# Patient Record
Sex: Female | Born: 1960
Health system: Southern US, Community
[De-identification: ages and names within clinical notes are randomized; demographics above are authoritative.]

## PROBLEM LIST (undated history)

## (undated) DIAGNOSIS — M1990 Unspecified osteoarthritis, unspecified site: Secondary | ICD-10-CM

## (undated) DIAGNOSIS — M069 Rheumatoid arthritis, unspecified: Secondary | ICD-10-CM

## (undated) DIAGNOSIS — I10 Essential (primary) hypertension: Secondary | ICD-10-CM

## (undated) HISTORY — PX: TONSILLECTOMY: SUR1361

## (undated) HISTORY — DX: Rheumatoid arthritis, unspecified: M06.9

---

## 1998-04-13 ENCOUNTER — Other Ambulatory Visit: Admission: RE | Admit: 1998-04-13 | Discharge: 1998-04-13 | Payer: Self-pay | Admitting: Obstetrics & Gynecology

## 2000-08-26 ENCOUNTER — Other Ambulatory Visit: Admission: RE | Admit: 2000-08-26 | Discharge: 2000-08-26 | Payer: Self-pay | Admitting: Obstetrics and Gynecology

## 2005-09-17 DIAGNOSIS — I10 Essential (primary) hypertension: Secondary | ICD-10-CM | POA: Insufficient documentation

## 2008-02-03 ENCOUNTER — Encounter: Admission: RE | Admit: 2008-02-03 | Discharge: 2008-02-03 | Payer: Self-pay | Admitting: Rheumatology

## 2008-09-17 DIAGNOSIS — K219 Gastro-esophageal reflux disease without esophagitis: Secondary | ICD-10-CM | POA: Insufficient documentation

## 2010-11-27 ENCOUNTER — Inpatient Hospital Stay (INDEPENDENT_AMBULATORY_CARE_PROVIDER_SITE_OTHER)
Admission: RE | Admit: 2010-11-27 | Discharge: 2010-11-27 | Disposition: A | Discharge status: Home / Self Care | Payer: 59 | Source: Ambulatory Visit | Attending: Family Medicine | Admitting: Family Medicine

## 2010-11-27 DIAGNOSIS — N390 Urinary tract infection, site not specified: Secondary | ICD-10-CM

## 2010-11-27 LAB — POCT URINALYSIS DIPSTICK
Nitrite: POSITIVE — AB
Urobilinogen, UA: 0.2 mg/dL (ref 0.0–1.0)
pH: 6 (ref 5.0–8.0)

## 2010-11-29 LAB — URINE CULTURE

## 2012-06-20 ENCOUNTER — Encounter: Payer: Self-pay | Admitting: Pharmacist

## 2012-06-20 ENCOUNTER — Ambulatory Visit (INDEPENDENT_AMBULATORY_CARE_PROVIDER_SITE_OTHER): Payer: Self-pay | Admitting: Pharmacist

## 2012-06-20 VITALS — BP 116/84 | HR 76 | Ht 67.0 in | Wt 176.2 lb

## 2012-06-20 DIAGNOSIS — K219 Gastro-esophageal reflux disease without esophagitis: Secondary | ICD-10-CM

## 2012-06-20 DIAGNOSIS — I10 Essential (primary) hypertension: Secondary | ICD-10-CM

## 2012-06-20 DIAGNOSIS — J309 Allergic rhinitis, unspecified: Secondary | ICD-10-CM

## 2012-06-20 DIAGNOSIS — M069 Rheumatoid arthritis, unspecified: Secondary | ICD-10-CM

## 2012-06-20 DIAGNOSIS — F419 Anxiety disorder, unspecified: Secondary | ICD-10-CM

## 2012-06-20 DIAGNOSIS — F411 Generalized anxiety disorder: Secondary | ICD-10-CM

## 2012-06-20 NOTE — Assessment & Plan Note (Signed)
Following medication review, no suggestions for change.  Complete medication list provided to patient.  Total time in face to face medication review: 25 minutes.  Patient seen with: Tiney Rouge, PharmD Candidate and Lillia Pauls, PharmD, Pharmacy Resident.

## 2012-06-20 NOTE — Progress Notes (Signed)
  Subjective:    Patient ID: Samantha Yates, female    DOB: 09/20/1960, 51 y.o.   MRN: [id number]  HPI Patient arrives in good spirits for medication review.   Reports seeing Haydee Monica as primary care provider, Dr. Azzie Roup as rheumatologist.  Reports being diagnosed with RA since  2008 and states this is currently under an acceptable level of control with Enbrel.    Review of Systems     Objective:   Physical Exam        Assessment & Plan:  Following medication review, no suggestions for change.  Complete medication list provided to patient.  Total time in face to face medication review: 25 minutes.  Patient seen with: Tiney Rouge, PharmD Candidate and Lillia Pauls, PharmD, Pharmacy Resident.

## 2012-06-20 NOTE — Patient Instructions (Signed)
Thank you for coming in today to complete you medication review.  You will now qualify for the lower copay for Enbrel at [facility].

## 2012-06-23 NOTE — Progress Notes (Signed)
Patient ID: Samantha Yates, female   DOB: 12-13-1960, 51 y.o.   MRN: [id number] Reviewed and agree with Dr. Macky Lower documentation and management.

## 2012-11-13 ENCOUNTER — Ambulatory Visit (INDEPENDENT_AMBULATORY_CARE_PROVIDER_SITE_OTHER): Payer: Self-pay | Admitting: Pharmacist

## 2012-11-13 ENCOUNTER — Encounter: Payer: Self-pay | Admitting: Pharmacist

## 2012-11-13 VITALS — Wt 176.0 lb

## 2012-11-13 DIAGNOSIS — M069 Rheumatoid arthritis, unspecified: Secondary | ICD-10-CM

## 2012-11-13 NOTE — Progress Notes (Signed)
  Subjective:    Patient ID: Samantha Yates, female    DOB: 06-18-1961, 52 y.o.   MRN: [id number]  HPI Patient arrives in good spirits for medication review.  Reports seeing Dwyane Luo, [place]  - [place] as primary care provider, Dr. Azzie Roup / Azucena Fallen as rheumatologist and PA. Reports being diagnosed with RA since 2008 and states this is currently being switched from Enbrel and Arava to Methotrexate and Humira.     Review of Systems     Objective:   Physical Exam        Assessment & Plan:   Following medication review, discussed possible switch to calcium citrate due to PPI use.   Complete medication list provided to patient.  Total time in face to face medication review:  15 minutes.

## 2012-11-13 NOTE — Patient Instructions (Addendum)
Thanks for coming in today.   Consider switching from calcium carbonate to calcium citrate 950mg  (200mg  elemental)   - Suggest you take 2 twice daily (total 800mg  of elemental calcium)

## 2012-11-14 NOTE — Progress Notes (Signed)
Patient ID: Samantha Yates, female   DOB: [birth year], 52 y.o.   MRN: [id number] Reviewed: Agree with Dr. Macky Lower documentation and management.

## 2012-12-25 ENCOUNTER — Other Ambulatory Visit (HOSPITAL_COMMUNITY): Payer: Self-pay | Admitting: Physician Assistant

## 2012-12-25 DIAGNOSIS — Z1231 Encounter for screening mammogram for malignant neoplasm of breast: Secondary | ICD-10-CM

## 2013-01-13 ENCOUNTER — Ambulatory Visit (HOSPITAL_COMMUNITY): Payer: 59

## 2013-01-27 ENCOUNTER — Ambulatory Visit (HOSPITAL_COMMUNITY)
Admission: RE | Admit: 2013-01-27 | Discharge: 2013-01-27 | Disposition: A | Discharge status: Home / Self Care | Payer: 59 | Source: Ambulatory Visit | Attending: Physician Assistant | Admitting: Physician Assistant

## 2013-01-27 DIAGNOSIS — Z1231 Encounter for screening mammogram for malignant neoplasm of breast: Secondary | ICD-10-CM | POA: Insufficient documentation

## 2014-03-10 ENCOUNTER — Ambulatory Visit (INDEPENDENT_AMBULATORY_CARE_PROVIDER_SITE_OTHER): Payer: BC Managed Care – PPO

## 2014-03-10 ENCOUNTER — Encounter: Payer: Self-pay | Admitting: Podiatry

## 2014-03-10 ENCOUNTER — Ambulatory Visit (INDEPENDENT_AMBULATORY_CARE_PROVIDER_SITE_OTHER): Payer: BC Managed Care – PPO | Admitting: Podiatry

## 2014-03-10 VITALS — BP 119/70 | HR 76 | Resp 16 | Ht 68.0 in | Wt 175.0 lb

## 2014-03-10 DIAGNOSIS — M069 Rheumatoid arthritis, unspecified: Secondary | ICD-10-CM

## 2014-03-10 DIAGNOSIS — M0630 Rheumatoid nodule, unspecified site: Secondary | ICD-10-CM

## 2014-03-10 DIAGNOSIS — M6740 Ganglion, unspecified site: Secondary | ICD-10-CM

## 2014-03-10 NOTE — Progress Notes (Signed)
   Subjective:    Patient ID: Samantha Yates, female    DOB: April 22, 1961, 53 y.o.   MRN: [id number]  HPI Comments: i have these places on my big toes. I think its fluid. The left toe has just started and the right one has been there for a while. It hurts to walk. The toes are getting better. i havent done anything for my toes. My doctor put me on prednisone and it seemed to help.  Foot Pain Associated symptoms include fatigue.      Review of Systems  Constitutional: Positive for fatigue.       Sweating  Musculoskeletal:       Joint pain  Allergic/Immunologic: Positive for environmental allergies.  All other systems reviewed and are negative.      Objective:   Physical Exam: I have reviewed her past medical history medications allergies surgeries social history and review of systems. Pulses are strongly palpable bilateral. Neurologic sensorium is intact per since once the monofilament. Deep tendon reflexes are intact bilateral and muscle strength is 5 over 5 dorsiflexors plantar flexors inverters everters all intrinsic musculature is intact. Orthopedic evaluation demonstrates all joints distal to the ankle a full range of motion without crepitation. Cutaneous evaluation demonstrates a large nonpulsatile firm mass to the plantar lateral aspect of the plantar surface of the proximal phalanx right hallux smaller lesion similarly noted the contralateral foot. She also has lesions sub-fifth metatarsal heads bilaterally. Radiographic evaluation does not demonstrate any type of osseous abnormalities in these areas. No calcifications.        Assessment & Plan:  Assessment: Rheumatoid nodules hallux and sub-fifth bilateral right greater than left.  Plan: We used local anesthetic a numb of the toe today and I injected his nodules with Kenalog and local anesthetic. I will followup with her in 6 weeks to see how these are doing we may need to consider surgical intervention.

## 2014-04-21 ENCOUNTER — Ambulatory Visit (INDEPENDENT_AMBULATORY_CARE_PROVIDER_SITE_OTHER): Payer: BC Managed Care – PPO | Admitting: Podiatry

## 2014-04-21 VITALS — BP 132/76 | HR 99 | Resp 16

## 2014-04-21 DIAGNOSIS — M069 Rheumatoid arthritis, unspecified: Secondary | ICD-10-CM

## 2014-04-21 DIAGNOSIS — M0630 Rheumatoid nodule, unspecified site: Secondary | ICD-10-CM

## 2014-04-21 NOTE — Progress Notes (Signed)
She presents today for followup of rheumatoid nodules to the plantar aspect of the IP joint hallux bilaterally. Last time she was in we reinjected that his she states that it gone down considerably and no longer are painful with exception of the right distal little bit. She's also concerned about 2 areas that consistently are thick and painful in the fifth metatarsal heads bilateral.  Objective: Vital signs are stable she is alert and oriented x3 much decrease in size since last visit are the 2 lesions beneath the IP joint of the hallux bilaterally. She also has what appears to be firm nodules beneath the fifth metatarsal heads bilaterally more than likely associated with rheumatoid nodules.  Assessment: Shrinking rheumatoid nodules IP joint hallux bilaterally. However she does rheumatoid nodules to the plantar aspect of the bilateral fifth metatarsals.  Plan: Reinjected the lesion to the right IP joint today and injected the plantar aspect of the fifth metatarsophalangeal joints bilaterally. I will followup with her in about 6 weeks to see how she is doing.

## 2014-06-02 ENCOUNTER — Ambulatory Visit: Payer: BC Managed Care – PPO | Admitting: Podiatry

## 2014-11-11 ENCOUNTER — Other Ambulatory Visit (HOSPITAL_COMMUNITY): Payer: Self-pay | Admitting: Family Medicine

## 2014-11-11 DIAGNOSIS — Z139 Encounter for screening, unspecified: Secondary | ICD-10-CM

## 2014-11-17 ENCOUNTER — Ambulatory Visit (HOSPITAL_COMMUNITY): Payer: 59

## 2014-11-18 ENCOUNTER — Ambulatory Visit (HOSPITAL_COMMUNITY)
Admission: RE | Admit: 2014-11-18 | Discharge: 2014-11-18 | Disposition: A | Discharge status: Home / Self Care | Payer: BLUE CROSS/BLUE SHIELD | Source: Ambulatory Visit | Attending: Family Medicine | Admitting: Family Medicine

## 2014-11-18 DIAGNOSIS — Z1231 Encounter for screening mammogram for malignant neoplasm of breast: Secondary | ICD-10-CM | POA: Insufficient documentation

## 2014-11-18 DIAGNOSIS — Z139 Encounter for screening, unspecified: Secondary | ICD-10-CM

## 2014-11-19 ENCOUNTER — Other Ambulatory Visit: Payer: Self-pay | Admitting: Family Medicine

## 2014-11-19 DIAGNOSIS — R928 Other abnormal and inconclusive findings on diagnostic imaging of breast: Secondary | ICD-10-CM

## 2014-11-24 ENCOUNTER — Ambulatory Visit
Admission: RE | Admit: 2014-11-24 | Discharge: 2014-11-24 | Disposition: A | Discharge status: Home / Self Care | Payer: BLUE CROSS/BLUE SHIELD | Source: Ambulatory Visit | Attending: Family Medicine | Admitting: Family Medicine

## 2014-11-24 DIAGNOSIS — R928 Other abnormal and inconclusive findings on diagnostic imaging of breast: Secondary | ICD-10-CM

## 2016-02-06 ENCOUNTER — Other Ambulatory Visit: Payer: Self-pay

## 2016-02-06 DIAGNOSIS — Z1231 Encounter for screening mammogram for malignant neoplasm of breast: Secondary | ICD-10-CM

## 2016-02-17 ENCOUNTER — Other Ambulatory Visit: Payer: Self-pay | Admitting: Internal Medicine

## 2016-02-17 ENCOUNTER — Ambulatory Visit
Admission: RE | Admit: 2016-02-17 | Discharge: 2016-02-17 | Disposition: A | Discharge status: Home / Self Care | Payer: BLUE CROSS/BLUE SHIELD | Source: Ambulatory Visit

## 2016-02-17 DIAGNOSIS — Z1231 Encounter for screening mammogram for malignant neoplasm of breast: Secondary | ICD-10-CM

## 2016-05-24 DIAGNOSIS — I10 Essential (primary) hypertension: Secondary | ICD-10-CM | POA: Diagnosis not present

## 2016-05-24 DIAGNOSIS — K219 Gastro-esophageal reflux disease without esophagitis: Secondary | ICD-10-CM | POA: Diagnosis not present

## 2016-05-24 DIAGNOSIS — Z1389 Encounter for screening for other disorder: Secondary | ICD-10-CM | POA: Diagnosis not present

## 2016-05-24 DIAGNOSIS — N342 Other urethritis: Secondary | ICD-10-CM | POA: Diagnosis not present

## 2016-06-01 DIAGNOSIS — N342 Other urethritis: Secondary | ICD-10-CM | POA: Diagnosis not present

## 2016-07-17 DIAGNOSIS — Z23 Encounter for immunization: Secondary | ICD-10-CM | POA: Diagnosis not present

## 2016-07-24 ENCOUNTER — Other Ambulatory Visit: Payer: Self-pay | Admitting: Rheumatology

## 2016-07-24 DIAGNOSIS — M0579 Rheumatoid arthritis with rheumatoid factor of multiple sites without organ or systems involvement: Secondary | ICD-10-CM

## 2016-07-24 DIAGNOSIS — Z79631 Long term (current) use of antimetabolite agent: Secondary | ICD-10-CM

## 2016-07-24 DIAGNOSIS — Z79899 Other long term (current) drug therapy: Secondary | ICD-10-CM | POA: Diagnosis not present

## 2016-07-24 LAB — CBC WITH DIFFERENTIAL/PLATELET
BASOS ABS: 0 {cells}/uL (ref 0–200)
BASOS PCT: 0 %
EOS PCT: 3 %
Eosinophils Absolute: 243 cells/uL (ref 15–500)
HCT: 34.9 % — ABNORMAL LOW (ref 35.0–45.0)
HEMOGLOBIN: 11.6 g/dL — AB (ref 11.7–15.5)
LYMPHS ABS: 3159 {cells}/uL (ref 850–3900)
Lymphocytes Relative: 39 %
MCH: 32.2 pg (ref 27.0–33.0)
MCHC: 33.2 g/dL (ref 32.0–36.0)
MCV: 96.9 fL (ref 80.0–100.0)
MPV: 10.7 fL (ref 7.5–12.5)
Monocytes Absolute: 648 cells/uL (ref 200–950)
Monocytes Relative: 8 %
NEUTROS ABS: 4050 {cells}/uL (ref 1500–7800)
Neutrophils Relative %: 50 %
Platelets: 228 10*3/uL (ref 140–400)
RBC: 3.6 MIL/uL — AB (ref 3.80–5.10)
RDW: 14.4 % (ref 11.0–15.0)
WBC: 8.1 10*3/uL (ref 3.8–10.8)

## 2016-07-24 MED ORDER — METHOTREXATE (ANTI-RHEUMATIC) 2.5 MG PO TABS: 20.0000 mg | ORAL_TABLET | ORAL | 0 refills | Status: DC

## 2016-07-24 NOTE — Addendum Note (Signed)
Addended byCandice Camp on: 07/24/2016 10:28 AM   Modules accepted: Orders

## 2016-07-24 NOTE — Telephone Encounter (Signed)
Patient calling to let us know that she will be coming in this afternoon for labwork between 1 and 4 because she is due for her Rx METX.

## 2016-07-24 NOTE — Telephone Encounter (Signed)
Patient needs a refill of MTX [facility]; new insurance card on file.

## 2016-07-24 NOTE — Telephone Encounter (Signed)
Orders placed/ per SD MTX refilled per SD   Patient needs to make a follow up appt please call her to make appt  Last visit 05/01/16 Dec/ Jan

## 2016-07-25 ENCOUNTER — Encounter: Payer: Self-pay | Admitting: Rheumatology

## 2016-07-25 ENCOUNTER — Other Ambulatory Visit: Payer: Self-pay | Admitting: Radiology

## 2016-07-25 LAB — COMPLETE METABOLIC PANEL WITH GFR
ALBUMIN: 4.2 g/dL (ref 3.6–5.1)
ALK PHOS: 63 U/L (ref 33–130)
ALT: 13 U/L (ref 6–29)
AST: 21 U/L (ref 10–35)
BILIRUBIN TOTAL: 0.3 mg/dL (ref 0.2–1.2)
BUN: 14 mg/dL (ref 7–25)
CO2: 21 mmol/L (ref 20–31)
Calcium: 9.3 mg/dL (ref 8.6–10.4)
Chloride: 102 mmol/L (ref 98–110)
Creat: 1.06 mg/dL — ABNORMAL HIGH (ref 0.50–1.05)
GFR, Est African American: 68 mL/min (ref >=60)
GFR, Est Non African American: 59 mL/min — ABNORMAL LOW (ref >=60)
GLUCOSE: 89 mg/dL (ref 65–99)
Potassium: 4.6 mmol/L (ref 3.5–5.3)
SODIUM: 138 mmol/L (ref 135–146)
TOTAL PROTEIN: 6.4 g/dL (ref 6.1–8.1)

## 2016-07-25 MED ORDER — ABATACEPT 125 MG/ML ~~LOC~~ SOSY: PREFILLED_SYRINGE | SUBCUTANEOUS | 0 refills | Status: DC

## 2016-07-25 MED ORDER — METHOTREXATE (ANTI-RHEUMATIC) 2.5 MG PO TABS: 20.0000 mg | ORAL_TABLET | ORAL | 0 refills | Status: DC

## 2016-07-25 NOTE — Telephone Encounter (Signed)
I called patient and advised to decrease her methotrexate to 4 tablets per week. We will check labs as a scheduled. I reviewed her chart and she was clinically doing well. If she has a flare or increased joint pain she supposed to notify us.

## 2016-07-25 NOTE — Telephone Encounter (Signed)
Last visit 05/01/16 Next visit 11/06/16 Labs 07/25/16  Advised patient labs WNL  Ok to refill per Dr Estanislado Pandy resent to correct pharmacy

## 2016-07-26 ENCOUNTER — Other Ambulatory Visit: Payer: Self-pay | Admitting: Radiology

## 2016-07-26 MED ORDER — ABATACEPT 125 MG/ML ~~LOC~~ SOSY: PREFILLED_SYRINGE | SUBCUTANEOUS | 0 refills | Status: DC

## 2016-07-26 NOTE — Telephone Encounter (Signed)
Completed forms for patient for patient assistance program for Orencia/ [employer], faxed, mailed to patient for her records

## 2016-08-30 ENCOUNTER — Telehealth: Payer: Self-pay | Admitting: Pharmacist

## 2016-08-30 DIAGNOSIS — J069 Acute upper respiratory infection, unspecified: Secondary | ICD-10-CM | POA: Diagnosis not present

## 2016-08-30 DIAGNOSIS — Z1389 Encounter for screening for other disorder: Secondary | ICD-10-CM | POA: Diagnosis not present

## 2016-08-30 NOTE — Telephone Encounter (Signed)
I called patient back.  Patient confirms she received her portion of the application.  She wants to go ahead and apply for the patient assistance foundation.    Last visit: 05/01/16 Next visit: 11/06/16 Labs on 07/24/16: CMP with Cr 1.06, GFR 59; CBC with Hbg 11.6 TB Gold on 07/25/16 was negative   Okay to re-submit for Orencia patient assistance program per Dr. Estanislado Pandy.  Provider portion was signed by Dr. Estanislado Pandy and will be faxed.  Advised patient to submit her portion.  She voiced understanding.    Elisabeth Most, Pharm.D., BCPS Clinical Pharmacist Pager: [phone] Phone: [phone] 08/30/2016 1:13 PM

## 2016-08-30 NOTE — Telephone Encounter (Signed)
Patient returned Rachel's call. Please call patient back.

## 2016-08-30 NOTE — Telephone Encounter (Signed)
Received a fax from [place] regarding re-enrollment for [place] Patient Assistance Program.  Letter states patient must resubmit her application for enrollment for 2018.  The letter states that if the patient is enrolled in a Medicare Part D plan, they may not be eligible in 2018 until thye have spent 3% of their annual household income on prescription medications in 2018 (they consider prescription expenses for all members of the household towards the 3%).  Called patient to ensure she receives the renewal form.  Left message for patient to call me back.

## 2016-09-19 ENCOUNTER — Telehealth: Payer: Self-pay | Admitting: Pharmacist

## 2016-09-19 ENCOUNTER — Other Ambulatory Visit: Payer: Self-pay | Admitting: *Deleted

## 2016-09-19 NOTE — Telephone Encounter (Signed)
Received phone call from [facility] requesting Prior Approval for [place].  I reviewed patient's chart.  Patient was planning to re-apply for [place] for the [place] Patient Assistance Program to get her Orencia for no charge.  I called [employer] to check on status of patient's application.  I was informed they needed to know whether prescription should be mailed to patient or provider's office before they can process the application.  I advised it should be mailed to patient.  They report they will process patient's application.  They were unable to tell me how long it would take.    Called patient to discuss.  I updated patient on status of patient assitance program application.  I again discussed with patient that I was not sure if she would qualify for the program since Medicare Part D patient's may not be eligible in 2018 until thye have spent 3% of their annual household income on prescription medications in 2018.  Discussed other options for assistance as well.  Advised patient that the Patient [employer] has funds available and they would assist in paying $3800 toward her [place].  Patient was interested.  We applied for the program and patient was successfully enrolled. I will mail patient the approval information.    I submitted PA request for Orencia.  Will update patient once I get status of PA request.  She confirms she has one more Orencia injection at this time.     Elisabeth Most, Pharm.D., BCPS Clinical Pharmacist Pager: [phone] Phone: [phone] 09/19/2016 3:22 PM

## 2016-09-20 ENCOUNTER — Telehealth: Payer: Self-pay | Admitting: Rheumatology

## 2016-09-20 ENCOUNTER — Telehealth: Payer: Self-pay | Admitting: Pharmacist

## 2016-09-20 ENCOUNTER — Other Ambulatory Visit: Payer: Self-pay | Admitting: *Deleted

## 2016-09-20 MED ORDER — ABATACEPT 125 MG/ML ~~LOC~~ SOSY: PREFILLED_SYRINGE | SUBCUTANEOUS | 2 refills | Status: DC

## 2016-09-20 NOTE — Telephone Encounter (Signed)
Received fax from [facility] requesting additional information on PA for Orencia.  I called [facility] and completed application.  PA was approved [id number], Approval from 09/20/16 to 09/16/17).    I called patient to update her.  Advised her to give [employer] information to [place] when she fills the prescription.  This should help cover her copay.  I provided patient with [place]'s phone number.  She denies any other questions or concerns at this time.    Elisabeth Most, Pharm.D., BCPS Clinical Pharmacist Pager: [phone] Phone: [phone] 09/20/2016 12:01 PM

## 2016-09-20 NOTE — Telephone Encounter (Signed)
The [place] patient assistance program called about Samantha Yates and they are requesting a corrected rx of quantity of 4 for a 30 day supply instead of 12.  Fax# [phone] Cb#[phone]

## 2016-09-20 NOTE — Telephone Encounter (Signed)
Corrected prescription faxed to pharmacy

## 2016-09-24 ENCOUNTER — Telehealth: Payer: Self-pay

## 2016-09-24 NOTE — Telephone Encounter (Signed)
Received an approval fax from [employer] patient assistant foundation for patients Orencia sub-q. She is approved from 09/21/16-09/16/17.   Case number: [id number] Phone number: [phone]  Spoke with patient to update her and she voiced understanding. Told her to contact the company to set up a time to have the medicine mailed to her home.   Will scan documents in epic.  Deavion Strider, [place], CPhT

## 2016-10-23 NOTE — Progress Notes (Deleted)
   Office Visit Note  Patient: Samantha Yates             Date of Birth: 11/07/1960           MRN: [id number]             PCP: Collene Mares, PA-C Referring: Ginger Organ Visit Date: 11/06/2016 Occupation: @GUAROCC @    Subjective:  No chief complaint on file.   History of Present Illness: Samantha Yates is a 56 y.o. female ***   Activities of Daily Living:  Patient reports morning stiffness for *** {minute/hour:19697}.   Patient {ACTIONS;DENIES/REPORTS:21021675::"Denies"} nocturnal pain.  Difficulty dressing/grooming: {ACTIONS;DENIES/REPORTS:21021675::"Denies"} Difficulty climbing stairs: {ACTIONS;DENIES/REPORTS:21021675::"Denies"} Difficulty getting out of chair: {ACTIONS;DENIES/REPORTS:21021675::"Denies"} Difficulty using hands for taps, buttons, cutlery, and/or writing: {ACTIONS;DENIES/REPORTS:21021675::"Denies"}   No Rheumatology ROS completed.   PMFS History:  Patient Active Problem List   Diagnosis Date Noted  . GERD (gastroesophageal reflux disease) 09/17/2008  . Rheumatoid arthritis(714.0) 09/17/2006  . Anxiety 09/17/2006  . Hypertension 09/17/2005  . Allergic rhinitis 09/17/1996    No past medical history on file.  No family history on file. No past surgical history on file. Social History   Social History Narrative  . No narrative on file     Objective: Vital Signs: There were no vitals taken for this visit.   Physical Exam   Musculoskeletal Exam: ***  CDAI Exam: No CDAI exam completed.    Investigation: Findings:  05/01/2016 X-ray of bilateral hands 2 views today showed bilateral PIP narrowing.  There were no MCP or intercarpal joint changes.  There was no interval change from her previous x-rays done in October 2014.  Bilateral feet x-rays 2 views showed PIP and DIP narrowing and erosive changes in her right 5th and left 3rd and 5th MTP joints and bilateral calcaneal spur, but no interval change from her previous x-rays from  October 2014.  Bilateral knee joint x-rays 2 views showed bilateral moderate medial compartment narrowing, no chondrocalcinosis and bilateral moderate patellofemoral narrowing. These findings are consistent with moderate osteoarthritis and moderate chondromalacia patella.   May 2017:  CBC and comprehensive metabolic panel was normal.  Rapid 3 score was 4.3 which is high severity, although she does not have any synovitis on examination.   11/08/2015 negative TB gold   02/03/2008 Her anti-CCP antibody was 1,025 which was very high and hepatitis panel was negative.  Her ANA was negative.  Her previous labs showed rheumatoid factor of 29 and sed rate of 43.      Imaging: No results found.  Speciality Comments: No specialty comments available.    Procedures:  No procedures performed Allergies: Patient has no known allergies.   Assessment / Plan:     Visit Diagnoses: No diagnosis found.    Orders: No orders of the defined types were placed in this encounter.  No orders of the defined types were placed in this encounter.   Face-to-face time spent with patient was *** minutes. 50% of time was spent in counseling and coordination of care.  Follow-Up Instructions: No Follow-up on file.   Lee Kuang, RT  Note - This record has been created using [employer].  Chart creation errors have been sought, but may not always  have been located. Such creation errors do not reflect on  the standard of medical care.

## 2016-11-05 NOTE — Progress Notes (Signed)
Office Visit Note  Patient: Samantha Yates             Date of Birth: 1960-10-25           MRN: [id number]             PCP: Purvis Kilts, MD Referring: Ginger Organ Visit Date: 11/09/2016 Occupation: @GUAROCC @    Subjective:  Pain of the Left Shoulder and Follow-up (has stifness of joints and headache )   History of Present Illness: ROSEANNA JAMES is a 56 y.o. female with history of rheumatoid arthritis and osteoarthritis. She states she's been doing fairly well as regards to rheumatoid arthritis. She's been having some discomfort in her left shoulder. She has occasional nocturnal pain in her left shoulder. She denies any joint swelling. Her feet continue to hurt. She's been tolerating medications well. She has occasional headaches she's not sure if it's related to medications or sinus .  Activities of Daily Living:  Patient reports morning stiffness for 20 minutes.   Patient Denies nocturnal pain.  Difficulty dressing/grooming: Denies Difficulty climbing stairs: Reports Difficulty getting out of chair: Reports Difficulty using hands for taps, buttons, cutlery, and/or writing: Reports   Review of Systems  Constitutional: Positive for fatigue and weakness. Negative for night sweats, weight gain and weight loss.  HENT: Positive for mouth dryness. Negative for mouth sores, trouble swallowing, trouble swallowing and nose dryness.   Eyes: Negative for pain, redness, visual disturbance and dryness.  Respiratory: Negative for cough, shortness of breath and difficulty breathing.   Cardiovascular: Negative for chest pain, palpitations, hypertension, irregular heartbeat and swelling in legs/feet.  Gastrointestinal: Negative for blood in stool, constipation and diarrhea.  Endocrine: Negative for increased urination.  Genitourinary: Negative for vaginal dryness.  Musculoskeletal: Positive for arthralgias, joint pain and morning stiffness. Negative for joint swelling,  myalgias, muscle weakness, muscle tenderness and myalgias.  Skin: Negative for color change, rash, hair loss, skin tightness, ulcers and sensitivity to sunlight.  Allergic/Immunologic: Negative for susceptible to infections.  Neurological: Negative for dizziness, memory loss and night sweats.  Hematological: Negative for swollen glands.  Psychiatric/Behavioral: Negative for depressed mood and sleep disturbance. The patient is not nervous/anxious.     PMFS History:  Patient Active Problem List   Diagnosis Date Noted  . High risk medication use 11/08/2016  . Primary osteoarthritis of both hands 11/08/2016  . Primary osteoarthritis of both feet 11/08/2016  . History of gastroesophageal reflux (GERD) 11/08/2016  . History of hypertension 11/08/2016  . History of anxiety 11/08/2016  . History of allergic rhinitis 11/08/2016  . Former smoker 11/08/2016  . GERD (gastroesophageal reflux disease) 09/17/2008  . Rheumatoid arthritis ([place]) 09/17/2006  . Anxiety 09/17/2006  . Hypertension 09/17/2005  . Allergic rhinitis 09/17/1996    No past medical history on file.  No family history on file. No past surgical history on file. Social History   Social History Narrative  . No narrative on file     Objective: Vital Signs: BP 118/70   Pulse 80   Ht 5\' 7"  (1.702 m)   Wt 186 lb (84.4 kg)   BMI 29.13 kg/m    Physical Exam  Constitutional: She is oriented to person, place, and time. She appears well-developed and well-nourished.  HENT:  Head: Normocephalic and atraumatic.  Eyes: Conjunctivae and EOM are normal.  Neck: Normal range of motion.  Cardiovascular: Normal rate, regular rhythm, normal heart sounds and intact distal pulses.   Pulmonary/Chest: Effort normal  and breath sounds normal.  Abdominal: Soft. Bowel sounds are normal.  Lymphadenopathy:    She has no cervical adenopathy.  Neurological: She is alert and oriented to person, place, and time.  Skin: Skin is warm and dry.  Capillary refill takes less than 2 seconds.  Psychiatric: She has a normal mood and affect. Her behavior is normal.  Nursing note and vitals reviewed.    Musculoskeletal Exam: C-spine and thoracic lumbar spine good range of motion she has good range of motion of her shoulder joints elbow joints wrist joint MCPs PIPs DIPs she has painful range of motion of her left shoulder joint was some discomfort. Hip joints knee joints ankles MTPs PIPs with good range of motion she is some discomfort across her MTPs without synovitis.  CDAI Exam: CDAI Homunculus Exam:   Tenderness:  LUE: glenohumeral Right foot: 1st MTP Left foot: 1st MTP  Joint Counts:  CDAI Tender Joint count: 1 CDAI Swollen Joint count: 0  Global Assessments:  Patient Global Assessment: 3 Provider Global Assessment: 3  CDAI Calculated Score: 7    Investigation: Findings:  05/01/2016 X-ray of bilateral hands 2 views today showed bilateral PIP narrowing.  There were no MCP or intercarpal joint changes.  There was no interval change from her previous x-rays done in October 2014.  Bilateral feet x-rays 2 views showed PIP and DIP narrowing and erosive changes in her right 5th and left 3rd and 5th MTP joints and bilateral calcaneal spur, but no interval change from her previous x-rays from October 2014.  Bilateral knee joint x-rays 2 views showed bilateral moderate medial compartment narrowing, no chondrocalcinosis and bilateral moderate patellofemoral narrowing. These findings are consistent with moderate osteoarthritis and moderate chondromalacia patella.   May 2017:  CBC and comprehensive metabolic panel was normal.  Rapid 3 score was 4.3 which is high severity, although she does not have any synovitis on examination.   11/08/2015 negative TB gold   02/03/2008 Her anti-CCP antibody was 1,025 which was very high and hepatitis panel was negative.  Her ANA was negative.  Her previous labs showed rheumatoid factor of 29 and sed rate of  43.      Imaging: No results found.  Speciality Comments: No specialty comments available.    Procedures:  Large Joint Inj Date/Time: 11/09/2016 2:29 PM Performed by: Bo Merino Authorized by: Bo Merino   Consent Given by:  Patient Site marked: the procedure site was marked   Timeout: prior to procedure the correct patient, procedure, and site was verified   Indications:  Pain Location:  Shoulder Site:  L glenohumeral Prep: patient was prepped and draped in usual sterile fashion   Needle Size:  27 G Needle Length:  1.5 inches Approach:  Posterior Ultrasound Guidance: No   Fluoroscopic Guidance: No   Arthrogram: No   Medications:  1 mL lidocaine 1 %; 40 mg triamcinolone acetonide 40 MG/ML Aspiration Attempted: Yes   Aspirate amount (mL):  0 Patient tolerance:  Patient tolerated the procedure well with no immediate complications   Allergies: Patient has no known allergies.   Assessment / Plan:     Visit Diagnoses: Rheumatoid arthritis involving multiple sites with positive rheumatoid factor (HCC) - Positive RF, positive CCP, erosive disease with nodulosis. She had no synovitis on examination she seems to be doing well on current dose of methotrexate and Orencia combination.  High risk medication use - Orencia subcutaneous, methotrexate 8 tablets per week, folic acid 2 tablets a day - Plan: Quantiferon  tb gold assay, CBC with Differential/Platelet, COMPLETE METABOLIC PANEL WITH GFR today and every 3 months  Primary osteoarthritis of both hands: Joint protection and muscle strengthening discussed.  Primary osteoarthritis of both feet: Proper fitting shoes were discussed.  Left shoulder joint bursitis and discomfort after informed consent was obtained the left shoulder joint was prepped and stroke fashion injected as described above she tolerated the procedure well.  Her other medical problems are as follows  History of gastroesophageal reflux  (GERD)  History of hypertension  History of anxiety  History of allergic rhinitis  Former smoker  Methotrexate, long term, current use - Plan: CBC with Differential/Platelet, COMPLETE METABOLIC PANEL WITH GFR  History of immunosuppression - Plan: Quantiferon tb gold assay    Orders: Orders Placed This Encounter  Procedures  . Large Joint Injection/Arthrocentesis  . Quantiferon tb gold assay  . CBC with Differential/Platelet  . COMPLETE METABOLIC PANEL WITH GFR   No orders of the defined types were placed in this encounter.   Face-to-face time spent with patient was 30 minutes. 50% of time was spent in counseling and coordination of care.  Follow-Up Instructions: Return in about 5 months (around 04/08/2017) for Rheumatoid arthritis.   Bo Merino, MD  Note - This record has been created using [employer].  Chart creation errors have been sought, but may not always  have been located. Such creation errors do not reflect on  the standard of medical care.

## 2016-11-06 ENCOUNTER — Ambulatory Visit: Payer: Medicare Other | Admitting: Rheumatology

## 2016-11-08 DIAGNOSIS — M19071 Primary osteoarthritis, right ankle and foot: Secondary | ICD-10-CM | POA: Insufficient documentation

## 2016-11-08 DIAGNOSIS — Z8709 Personal history of other diseases of the respiratory system: Secondary | ICD-10-CM | POA: Insufficient documentation

## 2016-11-08 DIAGNOSIS — Z8719 Personal history of other diseases of the digestive system: Secondary | ICD-10-CM | POA: Insufficient documentation

## 2016-11-08 DIAGNOSIS — M19041 Primary osteoarthritis, right hand: Secondary | ICD-10-CM | POA: Insufficient documentation

## 2016-11-08 DIAGNOSIS — M19072 Primary osteoarthritis, left ankle and foot: Secondary | ICD-10-CM

## 2016-11-08 DIAGNOSIS — Z79899 Other long term (current) drug therapy: Secondary | ICD-10-CM | POA: Insufficient documentation

## 2016-11-08 DIAGNOSIS — M19042 Primary osteoarthritis, left hand: Secondary | ICD-10-CM

## 2016-11-08 DIAGNOSIS — Z87891 Personal history of nicotine dependence: Secondary | ICD-10-CM | POA: Insufficient documentation

## 2016-11-08 DIAGNOSIS — Z8659 Personal history of other mental and behavioral disorders: Secondary | ICD-10-CM | POA: Insufficient documentation

## 2016-11-08 DIAGNOSIS — Z8679 Personal history of other diseases of the circulatory system: Secondary | ICD-10-CM | POA: Insufficient documentation

## 2016-11-09 ENCOUNTER — Encounter: Payer: Self-pay | Admitting: Rheumatology

## 2016-11-09 ENCOUNTER — Ambulatory Visit (INDEPENDENT_AMBULATORY_CARE_PROVIDER_SITE_OTHER): Payer: Medicare Other | Admitting: Rheumatology

## 2016-11-09 VITALS — BP 118/70 | HR 80 | Ht 67.0 in | Wt 186.0 lb

## 2016-11-09 DIAGNOSIS — Z79899 Other long term (current) drug therapy: Secondary | ICD-10-CM | POA: Diagnosis not present

## 2016-11-09 DIAGNOSIS — Z8679 Personal history of other diseases of the circulatory system: Secondary | ICD-10-CM

## 2016-11-09 DIAGNOSIS — M19041 Primary osteoarthritis, right hand: Secondary | ICD-10-CM | POA: Diagnosis not present

## 2016-11-09 DIAGNOSIS — Z8659 Personal history of other mental and behavioral disorders: Secondary | ICD-10-CM

## 2016-11-09 DIAGNOSIS — Z8719 Personal history of other diseases of the digestive system: Secondary | ICD-10-CM

## 2016-11-09 DIAGNOSIS — Z8709 Personal history of other diseases of the respiratory system: Secondary | ICD-10-CM

## 2016-11-09 DIAGNOSIS — M25512 Pain in left shoulder: Secondary | ICD-10-CM | POA: Diagnosis not present

## 2016-11-09 DIAGNOSIS — Z87891 Personal history of nicotine dependence: Secondary | ICD-10-CM

## 2016-11-09 DIAGNOSIS — M19042 Primary osteoarthritis, left hand: Secondary | ICD-10-CM

## 2016-11-09 DIAGNOSIS — M0579 Rheumatoid arthritis with rheumatoid factor of multiple sites without organ or systems involvement: Secondary | ICD-10-CM

## 2016-11-09 DIAGNOSIS — Z9225 Personal history of immunosupression therapy: Secondary | ICD-10-CM | POA: Diagnosis not present

## 2016-11-09 DIAGNOSIS — M19072 Primary osteoarthritis, left ankle and foot: Secondary | ICD-10-CM

## 2016-11-09 DIAGNOSIS — M19071 Primary osteoarthritis, right ankle and foot: Secondary | ICD-10-CM

## 2016-11-09 DIAGNOSIS — Z79631 Long term (current) use of antimetabolite agent: Secondary | ICD-10-CM

## 2016-11-09 LAB — CBC WITH DIFFERENTIAL/PLATELET
BASOS ABS: 0 {cells}/uL (ref 0–200)
Basophils Relative: 0 %
EOS ABS: 138 {cells}/uL (ref 15–500)
EOS PCT: 2 %
HEMATOCRIT: 37.9 % (ref 35.0–45.0)
HEMOGLOBIN: 12.8 g/dL (ref 11.7–15.5)
LYMPHS ABS: 2967 {cells}/uL (ref 850–3900)
Lymphocytes Relative: 43 %
MCH: 32.1 pg (ref 27.0–33.0)
MCHC: 33.8 g/dL (ref 32.0–36.0)
MCV: 95 fL (ref 80.0–100.0)
MONO ABS: 552 {cells}/uL (ref 200–950)
MPV: 10.6 fL (ref 7.5–12.5)
Monocytes Relative: 8 %
NEUTROS PCT: 47 %
Neutro Abs: 3243 cells/uL (ref 1500–7800)
Platelets: 231 10*3/uL (ref 140–400)
RBC: 3.99 MIL/uL (ref 3.80–5.10)
RDW: 13.9 % (ref 11.0–15.0)
WBC: 6.9 10*3/uL (ref 3.8–10.8)

## 2016-11-09 LAB — COMPLETE METABOLIC PANEL WITH GFR
ALBUMIN: 4.1 g/dL (ref 3.6–5.1)
ALK PHOS: 69 U/L (ref 33–130)
ALT: 16 U/L (ref 6–29)
AST: 19 U/L (ref 10–35)
BILIRUBIN TOTAL: 0.5 mg/dL (ref 0.2–1.2)
BUN: 17 mg/dL (ref 7–25)
CALCIUM: 9.5 mg/dL (ref 8.6–10.4)
CO2: 24 mmol/L (ref 20–31)
Chloride: 103 mmol/L (ref 98–110)
Creat: 1.08 mg/dL — ABNORMAL HIGH (ref 0.50–1.05)
GFR, EST AFRICAN AMERICAN: 66 mL/min (ref >=60)
GFR, EST NON AFRICAN AMERICAN: 57 mL/min — AB (ref >=60)
Glucose, Bld: 89 mg/dL (ref 65–99)
POTASSIUM: 5 mmol/L (ref 3.5–5.3)
Sodium: 136 mmol/L (ref 135–146)
TOTAL PROTEIN: 6.6 g/dL (ref 6.1–8.1)

## 2016-11-09 MED ORDER — LIDOCAINE HCL 1 % IJ SOLN
1.0000 mL | INTRAMUSCULAR | Status: AC | PRN
  Administered 2016-11-09: 1 mL

## 2016-11-09 MED ORDER — TRIAMCINOLONE ACETONIDE 40 MG/ML IJ SUSP
40.0000 mg | INTRAMUSCULAR | Status: AC | PRN
  Administered 2016-11-09: 40 mg via INTRA_ARTICULAR

## 2016-11-09 NOTE — Progress Notes (Signed)
Rheumatology Medication Review by a Pharmacist Does the patient feel that his/her medications are working for him/her?  Yes Has the patient been experiencing any side effects to the medications prescribed?  No Does the patient have any problems obtaining medications?  No - patient gets her Orencia from the patient assistance program.  She is enrolled through 09/16/17.    Issues to address at subsequent visits: None   Pharmacist comments:  Samantha Yates is a pleasant 56 yo F who presents for follow up of her rheumatoid arthritis.  She is currently taking Orencia SQ 125 mg weekly, methotrexate 10 mg (4 tablets) weekly, and FaBB one tablet daily.  She is due for standing labs and TB Gold today.  Patient denies any questions or concerns regarding her medications at this time.    Elisabeth Most, Pharm.D., BCPS, CPP Clinical Pharmacist Pager: [phone] Phone: [phone] 11/09/2016 2:12 PM

## 2016-11-09 NOTE — Patient Instructions (Signed)
Standing Labs We placed an order today for your standing lab work.    Please come back and get your standing labs in May 2018 and every 3 months.   We have open lab Monday through Friday from 8:30-11:30 AM and 1:30-4 PM at the office of Dr. Shaili Deveshwar/Naitik Panwala, PA.   The office is located at [address], [address], [place], [place] [address] No appointment is necessary.   Labs are drawn by [facility].  You may receive a bill from [facility] for your lab work.     

## 2016-11-11 NOTE — Progress Notes (Signed)
Labs are stable - we will continue to monitor.

## 2016-11-12 ENCOUNTER — Telehealth: Payer: Self-pay | Admitting: Rheumatology

## 2016-11-12 ENCOUNTER — Telehealth: Payer: Self-pay | Admitting: Radiology

## 2016-11-12 NOTE — Telephone Encounter (Signed)
-----   Message from Bo Merino, MD sent at 11/11/2016 10:14 PM EST ----- Labs are stable we will continue to monitor

## 2016-11-12 NOTE — Telephone Encounter (Signed)
Patient advised of lab results and verbalized understanding.  

## 2016-11-12 NOTE — Telephone Encounter (Signed)
Patient returned your call.  [id number].  Thank you.

## 2016-11-12 NOTE — Telephone Encounter (Signed)
I have called patient to advise labs are stable  

## 2016-11-12 NOTE — Telephone Encounter (Signed)
Patient has questions about kidney function levels for lab work. Patient is also requesting that the labs be sent to Dr. Loraine Leriche office please (her PCP).

## 2016-11-12 NOTE — Telephone Encounter (Signed)
Patient advised of lab results

## 2016-11-13 LAB — QUANTIFERON TB GOLD ASSAY (BLOOD)
Interferon Gamma Release Assay: NEGATIVE
Quantiferon Nil Value: 0.05 IU/mL
Quantiferon Tb Ag Minus Nil Value: <0 IU/mL

## 2016-11-16 ENCOUNTER — Other Ambulatory Visit: Payer: Self-pay | Admitting: *Deleted

## 2016-11-16 MED ORDER — ABATACEPT 125 MG/ML ~~LOC~~ SOSY: PREFILLED_SYRINGE | SUBCUTANEOUS | 2 refills | Status: DC

## 2016-11-16 NOTE — Telephone Encounter (Signed)
Last Visit: 11/09/16 Next Visit: 04/09/17 Labs: 11/09/16 Stable  Okay to refill Orencia?

## 2016-11-16 NOTE — Telephone Encounter (Signed)
ok 

## 2016-11-22 ENCOUNTER — Other Ambulatory Visit: Payer: Self-pay | Admitting: Rheumatology

## 2016-11-22 DIAGNOSIS — E782 Mixed hyperlipidemia: Secondary | ICD-10-CM | POA: Diagnosis not present

## 2016-11-22 DIAGNOSIS — R109 Unspecified abdominal pain: Secondary | ICD-10-CM | POA: Diagnosis not present

## 2016-11-22 DIAGNOSIS — R39198 Other difficulties with micturition: Secondary | ICD-10-CM | POA: Diagnosis not present

## 2016-11-22 NOTE — Telephone Encounter (Signed)
Last Visit: 11/09/16 Next Visit: 04/09/17 Labs: 11/09/16 Stable  Okay to refill FABB?

## 2016-11-22 NOTE — Telephone Encounter (Signed)
ok 

## 2016-11-25 ENCOUNTER — Other Ambulatory Visit: Payer: Self-pay | Admitting: Rheumatology

## 2016-11-25 DIAGNOSIS — M0579 Rheumatoid arthritis with rheumatoid factor of multiple sites without organ or systems involvement: Secondary | ICD-10-CM

## 2016-11-26 NOTE — Telephone Encounter (Signed)
11/09/16 last visit Next visit 04/09/2017 Labs stable 11/09/16 Ok to refill per Dr Estanislado Pandy

## 2016-12-15 ENCOUNTER — Encounter (HOSPITAL_COMMUNITY): Payer: Self-pay | Admitting: Emergency Medicine

## 2016-12-15 DIAGNOSIS — I10 Essential (primary) hypertension: Secondary | ICD-10-CM | POA: Insufficient documentation

## 2016-12-15 DIAGNOSIS — Z79899 Other long term (current) drug therapy: Secondary | ICD-10-CM | POA: Insufficient documentation

## 2016-12-15 DIAGNOSIS — W07XXXA Fall from chair, initial encounter: Secondary | ICD-10-CM | POA: Diagnosis not present

## 2016-12-15 DIAGNOSIS — Y92009 Unspecified place in unspecified non-institutional (private) residence as the place of occurrence of the external cause: Secondary | ICD-10-CM | POA: Diagnosis not present

## 2016-12-15 DIAGNOSIS — S6991XA Unspecified injury of right wrist, hand and finger(s), initial encounter: Secondary | ICD-10-CM | POA: Diagnosis present

## 2016-12-15 DIAGNOSIS — S7011XA Contusion of right thigh, initial encounter: Secondary | ICD-10-CM | POA: Insufficient documentation

## 2016-12-15 DIAGNOSIS — S40021A Contusion of right upper arm, initial encounter: Secondary | ICD-10-CM | POA: Insufficient documentation

## 2016-12-15 DIAGNOSIS — S40022A Contusion of left upper arm, initial encounter: Secondary | ICD-10-CM | POA: Insufficient documentation

## 2016-12-15 DIAGNOSIS — S61210A Laceration without foreign body of right index finger without damage to nail, initial encounter: Secondary | ICD-10-CM | POA: Insufficient documentation

## 2016-12-15 DIAGNOSIS — S60221A Contusion of right hand, initial encounter: Secondary | ICD-10-CM | POA: Diagnosis not present

## 2016-12-15 DIAGNOSIS — Y999 Unspecified external cause status: Secondary | ICD-10-CM | POA: Insufficient documentation

## 2016-12-15 DIAGNOSIS — Y9389 Activity, other specified: Secondary | ICD-10-CM | POA: Insufficient documentation

## 2016-12-15 DIAGNOSIS — Z87891 Personal history of nicotine dependence: Secondary | ICD-10-CM | POA: Diagnosis not present

## 2016-12-15 NOTE — ED Triage Notes (Signed)
Standing on chair to get meds and fell with a bowl in her hand now with multiple pain sites and bruises including L elbow bruising to her L arm and L thigh as well as an active bleed to her

## 2016-12-16 ENCOUNTER — Emergency Department (HOSPITAL_COMMUNITY)
Admission: EM | Admit: 2016-12-16 | Discharge: 2016-12-16 | Disposition: A | Discharge status: Home / Self Care | Payer: Medicare Other | Attending: Emergency Medicine | Admitting: Emergency Medicine

## 2016-12-16 DIAGNOSIS — S61210A Laceration without foreign body of right index finger without damage to nail, initial encounter: Secondary | ICD-10-CM

## 2016-12-16 DIAGNOSIS — W19XXXA Unspecified fall, initial encounter: Secondary | ICD-10-CM

## 2016-12-16 DIAGNOSIS — T07XXXA Unspecified multiple injuries, initial encounter: Secondary | ICD-10-CM

## 2016-12-16 HISTORY — DX: Unspecified osteoarthritis, unspecified site: M19.90

## 2016-12-16 HISTORY — DX: Essential (primary) hypertension: I10

## 2016-12-16 MED ORDER — LIDOCAINE HCL (PF) 1 % IJ SOLN
5.0000 mL | Freq: Once | INTRAMUSCULAR | Status: AC
  Administered 2016-12-16: 5 mL
  Filled 2016-12-16: qty 5

## 2016-12-16 MED ORDER — BACITRACIN ZINC 500 UNIT/GM EX OINT
TOPICAL_OINTMENT | CUTANEOUS | Status: AC
  Administered 2016-12-16: 1
  Filled 2016-12-16: qty 0.9

## 2016-12-16 NOTE — ED Provider Notes (Signed)
[facility] DEPT Provider Note   CSN: [id number] Arrival date & time: 12/15/16  2257     History   Chief Complaint Chief Complaint  Patient presents with  . Fall    multiple bruisees  . Laceration    HPI Samantha Yates is a 56 y.o. female.  The history is provided by the patient.  Fall  This is a new problem. Episode onset: just prior to arrival. The problem occurs constantly. The problem has not changed since onset.Pertinent negatives include no chest pain, no abdominal pain and no headaches. Nothing aggravates the symptoms. Nothing relieves the symptoms. She has tried rest for the symptoms. The treatment provided mild relief.  Laceration   Incident onset: just prior to arrival. The laceration is located on the right hand. The laceration is 1 cm in size. The pain is mild. The pain has been constant since onset. She reports no foreign bodies present. Her tetanus status is unknown.  patient presents after mechanical fall at home She fell with bowl in her hand She sustained two lacerations to right index finger She reports bruising to upper extremities and right thigh  No head injury No LOC No HA/neck pain No cp/abd pain She is ambulatory She is not on anticoagulants  Past Medical History:  Diagnosis Date  . Arthritis   . Hypertension     Patient Active Problem List   Diagnosis Date Noted  . High risk medication use 11/08/2016  . Primary osteoarthritis of both hands 11/08/2016  . Primary osteoarthritis of both feet 11/08/2016  . History of gastroesophageal reflux (GERD) 11/08/2016  . History of hypertension 11/08/2016  . History of anxiety 11/08/2016  . History of allergic rhinitis 11/08/2016  . Former smoker 11/08/2016  . GERD (gastroesophageal reflux disease) 09/17/2008  . Rheumatoid arthritis ([place]) 09/17/2006  . Anxiety 09/17/2006  . Hypertension 09/17/2005  . Allergic rhinitis 09/17/1996    Past Surgical History:  Procedure Laterality Date  .  TONSILLECTOMY      OB History    No data available       Home Medications    Prior to Admission medications   Medication Sig Start Date End Date Taking? Authorizing Provider  Abatacept [name]) 125 MG/ML SOSY One device sub q weekly 11/16/16   Bo Merino, MD  citalopram (CELEXA) 40 MG tablet Take 1 tablet (40 mg total) by mouth at bedtime. 06/20/12   Zenia Resides, MD  enalapril (VASOTEC) 20 MG tablet Take 1 tablet (20 mg total) by mouth at bedtime. 06/20/12   Zenia Resides, MD  FABB 2.2-25-1 MG TABS TAKE ONE TABLET BY MOUTH ONCE DAILY. 11/22/16   Bo Merino, MD  Flaxseed, Linseed, 1000 MG CAPS Take 1 capsule by mouth at bedtime.    Historical Provider, MD  flintstones complete (FLINTSTONES) 60 MG chewable tablet Chew 1 tablet by mouth daily with breakfast.    Historical Provider, MD  folic acid (FOLVITE) 1 MG tablet Take 2 tablets (2 mg total) by mouth at bedtime. 06/20/12   Zenia Resides, MD  loratadine (CLARITIN) 10 MG tablet Take 10 mg by mouth daily.    Historical Provider, MD  methotrexate (RHEUMATREX) 2.5 MG tablet TAKE 8 TABLETS BY MOUTH  ONCE A WEEK AT BEDTIME 11/26/16   Bo Merino, MD  Naproxen Sodium 220 MG CAPS Take 1 capsule (220 mg total) by mouth 2 (two) times daily in the am and at bedtime.. 06/20/12   Zenia Resides, MD  pantoprazole (PROTONIX) 40 MG  tablet Take 40 mg by mouth daily.    Historical Provider, MD    Family History No family history on file.  Social History Social History  Substance Use Topics  . Smoking status: Former Smoker    Packs/day: 0.30    Types: Cigarettes    Start date: 09/17/1986    Quit date: 09/17/1998  . Smokeless tobacco: Never Used  . Alcohol use No     Allergies   Patient has no known allergies.   Review of Systems Review of Systems  Constitutional: Negative for fever.  Cardiovascular: Negative for chest pain.  Gastrointestinal: Negative for abdominal pain.  Musculoskeletal: Negative for back pain.    Skin: Positive for wound.  Neurological: Negative for weakness and headaches.  All other systems reviewed and are negative.    Physical Exam Updated Vital Signs BP 140/71 (BP Location: Left Arm)   Pulse 96   Temp 98.7 F (37.1 C) (Oral)   Resp 18   Ht 5' 10.5" (1.791 m)   Wt 72.6 kg   SpO2 100%   BMI 22.63 kg/m   Physical Exam CONSTITUTIONAL: Well developed/well nourished HEAD: Normocephalic/atraumatic EYES: EOMI/PERRL ENMT: Mucous membranes moist NECK: supple no meningeal signs SPINE/BACK:entire spine nontender CV: S1/S2 noted, no murmurs/rubs/gallops noted LUNGS: Lungs are clear to auscultation bilaterally, no apparent distress ABDOMEN: soft, nontender, no rebound or guarding, bowel sounds noted throughout abdomen GU:no cva tenderness NEURO: Pt is awake/alert/appropriate, moves all extremitiesx4.  No facial droop.   EXTREMITIES: pulses normal/equal, full ROM.  Scattered bruising to both upper extremities but no focal tenderness/deformities, full ROM of both UE without difficulty Right inner thigh - bruising noted, does not include perineum, female chaperone present Pt ambulatory.  Pelvis stable.   SKIN: warm, color normal, 2 lacerations to proximal/extensor surface of right index finger, no tendon/bone/foreign bodies noted PSYCH: no abnormalities of mood noted, alert and oriented to situation   ED Treatments / Results  Labs (all labs ordered are listed, but only abnormal results are displayed) Labs Reviewed - No data to display  EKG  EKG Interpretation None       Radiology No results found.  Procedures Procedures  LACERATION REPAIR Performed by: Sharyon Cable Consent: Verbal consent obtained. Risks and benefits: risks, benefits and alternatives were discussed Patient identity confirmed: provided demographic data Time out performed prior to procedure Prepped and Draped in normal sterile fashion Wound explored Laceration Location: right index  finger Laceration Length: 1cm No Foreign Bodies seen or palpated Anesthesia: local infiltration Local anesthetic: lidocaine  Anesthetic total: 3 ml Irrigation method: tap water Amount of cleaning: standard Skin closure: simple interrupted Number of sutures or staples: 3 prolene Technique: interrupted Patient tolerance: Patient tolerated the procedure well with no immediate complications.  LACERATION REPAIR Performed by: Sharyon Cable Consent: Verbal consent obtained. Risks and benefits: risks, benefits and alternatives were discussed Patient identity confirmed: provided demographic data Time out performed prior to procedure Prepped and Draped in normal sterile fashion Wound explored Laceration Location: right index finger, extensor surface Laceration Length: 0.5cm No Foreign Bodies seen or palpated Anesthesia: local infiltration Local anesthetic: lidocaine  Anesthetic total: 1 ml Irrigation method: tap water Amount of cleaning: standard Skin closure: dermabond/tissue adhesive Technique: tissue adhesive Patient tolerance: Patient tolerated the procedure well with no immediate complications.   Medications Ordered in ED Medications  lidocaine (PF) (XYLOCAINE) 1 % injection 5 mL (5 mLs Other Given 12/16/16 0138)  bacitracin 500 UNIT/GM ointment (1 application  Given 10/26/39 0306)  Initial Impression / Assessment and Plan / ED Course  I have reviewed the triage vital signs and the nursing notes.      No imaging indicated Pt tolerated wound repair Discussed return precautions including signs of wound infection Final Clinical Impressions(s) / ED Diagnoses   Final diagnoses:  Fall, initial encounter  Laceration of right index finger without foreign body without damage to nail, initial encounter  Contusion of multiple sites    New Prescriptions Discharge Medication List as of 12/16/2016  2:36 AM       Ripley Fraise, MD 12/16/16 [phone]

## 2016-12-25 ENCOUNTER — Other Ambulatory Visit: Payer: Self-pay | Admitting: Rheumatology

## 2016-12-25 NOTE — Telephone Encounter (Signed)
Last Visit: 11/09/16  Next visit 04/09/2017  Okay to refill FAAB?

## 2016-12-25 NOTE — Telephone Encounter (Signed)
ok 

## 2016-12-26 ENCOUNTER — Other Ambulatory Visit: Payer: Self-pay | Admitting: Rheumatology

## 2016-12-26 MED ORDER — ABATACEPT 125 MG/ML ~~LOC~~ SOSY: PREFILLED_SYRINGE | SUBCUTANEOUS | 2 refills | Status: DC

## 2016-12-26 NOTE — Telephone Encounter (Signed)
Patient called requesting a refill of her Orencia and have it sent to [facility].  Their refill number is [phone].  Patient's [phone]

## 2016-12-26 NOTE — Telephone Encounter (Signed)
11/09/16 last visit Next visit 04/09/2017 Labs stable 11/09/16 TB Gold: 11/09/16 Neg  Okay to refill Orencia?

## 2016-12-26 NOTE — Telephone Encounter (Signed)
ok 

## 2016-12-27 ENCOUNTER — Other Ambulatory Visit: Payer: Self-pay | Admitting: *Deleted

## 2016-12-27 NOTE — Telephone Encounter (Signed)
Patient uses [place] Patient Assistance.  Prescription faxed.

## 2016-12-28 DIAGNOSIS — Z0001 Encounter for general adult medical examination with abnormal findings: Secondary | ICD-10-CM | POA: Diagnosis not present

## 2016-12-28 DIAGNOSIS — M069 Rheumatoid arthritis, unspecified: Secondary | ICD-10-CM | POA: Diagnosis not present

## 2017-01-07 DIAGNOSIS — L089 Local infection of the skin and subcutaneous tissue, unspecified: Secondary | ICD-10-CM | POA: Diagnosis not present

## 2017-01-10 ENCOUNTER — Ambulatory Visit: Payer: Medicare Other | Admitting: General Surgery

## 2017-01-10 DIAGNOSIS — L0291 Cutaneous abscess, unspecified: Secondary | ICD-10-CM | POA: Diagnosis not present

## 2017-01-17 ENCOUNTER — Telehealth: Payer: Self-pay | Admitting: Rheumatology

## 2017-01-17 DIAGNOSIS — L0291 Cutaneous abscess, unspecified: Secondary | ICD-10-CM | POA: Diagnosis not present

## 2017-01-17 NOTE — Telephone Encounter (Signed)
Patient advised that she may resume her MTX and Orencia once she has finished the antibiotic and the bite has healed. Patient state she she has seen the doctor today and states the bite is healing but is not completely healed yet. Patient advised to hold her medication until it has completely healed and and if it is not healing to see her PCP. Patient verbalized understanding.

## 2017-01-17 NOTE — Telephone Encounter (Signed)
Patient has not had last two doses of MTX and last dose of Orencia. Patient was bitten by a spider on her back and it was infected. She was put on doxycycline and last dose of that is this upcoming Monday. She was told to hold off on taking the medications until she finished the antibiotics. She is due to take MTX on Tuesday and Orencia on Saturday (so she will miss a second dose of Orencia). Please call patient and let her know if it will be ok to resume MTX on Tuesday.

## 2017-01-24 DIAGNOSIS — L03312 Cellulitis of back [any part except buttock]: Secondary | ICD-10-CM | POA: Diagnosis not present

## 2017-01-24 DIAGNOSIS — Z1389 Encounter for screening for other disorder: Secondary | ICD-10-CM | POA: Diagnosis not present

## 2017-02-15 DIAGNOSIS — D849 Immunodeficiency, unspecified: Secondary | ICD-10-CM | POA: Diagnosis not present

## 2017-02-15 DIAGNOSIS — S70261A Insect bite (nonvenomous), right hip, initial encounter: Secondary | ICD-10-CM | POA: Diagnosis not present

## 2017-02-15 DIAGNOSIS — J069 Acute upper respiratory infection, unspecified: Secondary | ICD-10-CM | POA: Diagnosis not present

## 2017-02-21 DIAGNOSIS — Z1211 Encounter for screening for malignant neoplasm of colon: Secondary | ICD-10-CM | POA: Diagnosis not present

## 2017-03-25 ENCOUNTER — Other Ambulatory Visit: Payer: Self-pay | Admitting: Rheumatology

## 2017-03-26 NOTE — Telephone Encounter (Signed)
11/09/16 last visit Next visit 04/09/2017  Okay to refill per Dr. Estanislado Pandy

## 2017-04-02 ENCOUNTER — Telehealth: Payer: Self-pay

## 2017-04-02 NOTE — Telephone Encounter (Signed)
Patient pharmacy called concerning Rx refill for pre-filled syringes for Orencia.  CB# is [phone].  Fax# is [phone].  Please advise.

## 2017-04-03 NOTE — Telephone Encounter (Signed)
30d

## 2017-04-03 NOTE — Telephone Encounter (Signed)
Contacted pharmacy and authorized a refill for a 30 day supply.

## 2017-04-03 NOTE — Telephone Encounter (Signed)
Last Visit: 11/25/16 Next Visit: 04/09/17 Labs: 11/09/16 stable  TB Gold: 11/09/16 Neg  Patient coming to update labs tomorrow  Okay to refill Orencia?

## 2017-04-04 ENCOUNTER — Other Ambulatory Visit: Payer: Self-pay

## 2017-04-04 ENCOUNTER — Telehealth: Payer: Self-pay | Admitting: Radiology

## 2017-04-04 DIAGNOSIS — Z79899 Other long term (current) drug therapy: Secondary | ICD-10-CM | POA: Diagnosis not present

## 2017-04-04 LAB — CBC WITH DIFFERENTIAL/PLATELET
BASOS ABS: 86 {cells}/uL (ref 0–200)
Basophils Relative: 1 %
Eosinophils Absolute: 172 cells/uL (ref 15–500)
Eosinophils Relative: 2 %
HEMATOCRIT: 38.1 % (ref 35.0–45.0)
HEMOGLOBIN: 12.7 g/dL (ref 11.7–15.5)
LYMPHS PCT: 43 %
Lymphs Abs: 3698 cells/uL (ref 850–3900)
MCH: 32 pg (ref 27.0–33.0)
MCHC: 33.3 g/dL (ref 32.0–36.0)
MCV: 96 fL (ref 80.0–100.0)
MONO ABS: 774 {cells}/uL (ref 200–950)
MPV: 10.9 fL (ref 7.5–12.5)
Monocytes Relative: 9 %
NEUTROS PCT: 45 %
Neutro Abs: 3870 cells/uL (ref 1500–7800)
Platelets: 245 10*3/uL (ref 140–400)
RBC: 3.97 MIL/uL (ref 3.80–5.10)
RDW: 13.6 % (ref 11.0–15.0)
WBC: 8.6 10*3/uL (ref 3.8–10.8)

## 2017-04-04 NOTE — Telephone Encounter (Signed)
Patient came in for labs today, would like Rx changed from PFS to pen, can you correct this on the Rx

## 2017-04-04 NOTE — Progress Notes (Signed)
Office Visit Note  Patient: Samantha Yates             Date of Birth: 01/21/1961           MRN: [id number]             PCP: Sharilyn Sites, MD Referring: Sharilyn Sites, MD Visit Date: 04/09/2017 Occupation: @GUAROCC @    Subjective:  Rheumatoid Arthritis (Follow up, doing good)   History of Present Illness: Samantha Yates is a 56 y.o. female with history of sero positive rheumatoid arthritis. She had a spider bite in April. She states she had to take 3 rounds of antibiotics. She was off methotrexate and Orencia for about 3 weeks. She states she had a mild flare. She restarted methotrexate and then added Orencia. She has some discomfort in her hands and her knee joints continue to be stiff. No joint swelling.  Activities of Daily Living:  Patient reports morning stiffness for 1 hour.   Patient Reports nocturnal pain.  Difficulty dressing/grooming: Denies Difficulty climbing stairs: Reports Difficulty getting out of chair: Denies Difficulty using hands for taps, buttons, cutlery, and/or writing: Denies   Review of Systems  Constitutional: Positive for fatigue. Negative for night sweats, weight gain, weight loss and weakness.  HENT: Positive for mouth dryness. Negative for mouth sores, trouble swallowing, trouble swallowing and nose dryness.   Eyes: Negative for pain, redness, visual disturbance and dryness.  Respiratory: Negative for cough, shortness of breath and difficulty breathing.   Cardiovascular: Negative for chest pain, palpitations, hypertension, irregular heartbeat and swelling in legs/feet.  Gastrointestinal: Negative for blood in stool, constipation and diarrhea.  Endocrine: Negative for increased urination.  Genitourinary: Negative for vaginal dryness.  Musculoskeletal: Positive for arthralgias, joint pain and morning stiffness. Negative for joint swelling, myalgias, muscle weakness, muscle tenderness and myalgias.  Skin: Negative for color change, rash, hair loss,  skin tightness, ulcers and sensitivity to sunlight.  Allergic/Immunologic: Negative for susceptible to infections.  Neurological: Negative for dizziness, memory loss and night sweats.  Hematological: Negative for swollen glands.  Psychiatric/Behavioral: Negative for depressed mood and sleep disturbance. The patient is not nervous/anxious.     PMFS History:  Patient Active Problem List   Diagnosis Date Noted  . High risk medication use 11/08/2016  . Primary osteoarthritis of both hands 11/08/2016  . Primary osteoarthritis of both feet 11/08/2016  . History of gastroesophageal reflux (GERD) 11/08/2016  . History of hypertension 11/08/2016  . History of anxiety 11/08/2016  . History of allergic rhinitis 11/08/2016  . Former smoker 11/08/2016  . GERD (gastroesophageal reflux disease) 09/17/2008  . Rheumatoid arthritis ([place]) 09/17/2006  . Anxiety 09/17/2006  . Hypertension 09/17/2005  . Allergic rhinitis 09/17/1996    Past Medical History:  Diagnosis Date  . Arthritis   . Hypertension   . Rheumatoid arthritis ([place])     History reviewed. No pertinent family history. Past Surgical History:  Procedure Laterality Date  . TONSILLECTOMY     Social History   Social History Narrative  . No narrative on file     Objective: Vital Signs: BP 93/60 (BP Location: Left Arm, Patient Position: Sitting, Cuff Size: Normal)   Pulse 79   Resp 16   Ht 5' 7.5" (1.715 m)   Wt 175 lb (79.4 kg)   BMI 27.00 kg/m    Physical Exam  Constitutional: She is oriented to person, place, and time. She appears well-developed and well-nourished.  HENT:  Head: Normocephalic and atraumatic.  Eyes: Conjunctivae and  EOM are normal.  Neck: Normal range of motion.  Cardiovascular: Normal rate, regular rhythm, normal heart sounds and intact distal pulses.   Pulmonary/Chest: Effort normal and breath sounds normal.  Abdominal: Soft. Bowel sounds are normal.  Lymphadenopathy:    She has no cervical  adenopathy.  Neurological: She is alert and oriented to person, place, and time.  Skin: Skin is warm and dry. Capillary refill takes less than 2 seconds.  Psychiatric: She has a normal mood and affect. Her behavior is normal.  Nursing note and vitals reviewed.    Musculoskeletal Exam: C-spine and thoracic lumbar spine good range of motion. Shoulder joints although joints was joint MCPs PIPs DIPs with good range of motion. Hip joints knee joints ankles MTPs PIPs are good range of motion with no synovitis. She has some discomfort and tenderness over  left subacromial bursa.  CDAI Exam: CDAI Homunculus Exam:   Joint Counts:  CDAI Tender Joint count: 0 CDAI Swollen Joint count: 0  Global Assessments:  Patient Global Assessment: 2 Provider Global Assessment: 2  CDAI Calculated Score: 4    Investigation: Findings:  11/09/2016 negative TB gold   CBC Latest Ref Rng & Units 04/04/2017 11/09/2016 07/24/2016  WBC 3.8 - 10.8 K/uL 8.6 6.9 8.1  Hemoglobin 11.7 - 15.5 g/dL 12.7 12.8 11.6(L)  Hematocrit 35.0 - 45.0 % 38.1 37.9 34.9(L)  Platelets 140 - 400 K/uL 245 231 228   CMP Latest Ref Rng & Units 04/04/2017 11/09/2016 07/24/2016  Glucose 65 - 99 mg/dL 97 89 89  BUN 7 - 25 mg/dL 15 17 14   Creatinine 0.50 - 1.05 mg/dL 0.92 1.08(H) 1.06(H)  Sodium 135 - 146 mmol/L 138 136 138  Potassium 3.5 - 5.3 mmol/L 4.6 5.0 4.6  Chloride 98 - 110 mmol/L 102 103 102  CO2 20 - 31 mmol/L 20 24 21   Calcium 8.6 - 10.4 mg/dL 9.4 9.5 9.3  Total Protein 6.1 - 8.1 g/dL 6.7 6.6 6.4  Total Bilirubin 0.2 - 1.2 mg/dL 0.3 0.5 0.3  Alkaline Phos 33 - 130 U/L 57 69 63  AST 10 - 35 U/L 19 19 21   ALT 6 - 29 U/L 15 16 13     Imaging: No results found.  Speciality Comments: No specialty comments available.    Procedures:  No procedures performed Allergies: Patient has no known allergies.   Assessment / Plan:     Visit Diagnoses: Rheumatoid arthritis involving multiple sites with positive rheumatoid factor  (HCC) - +RF,+anti-CCP with nodulosis. She's doing much better on current combination of therapy. She wants to switch to Orencia pain instead of syringes. We will change her next prescription.  High risk medication use - Methotrexate 8 ytabs po q week, Folic acid 2mg  po qd and Orencia sub q week. Her labs are within normal limits. We will continue to monitor labs every 3 months.  Subacromial bursitis of left shoulder joint: I have given her exercises for shoulder joints. We will see response to that if she has ongoing this discomfort he can consider cortisone injection in future.  Primary osteoarthritis of both hands: Joint protection and muscle strengthening discussed.  Primary osteoarthritis of both feet: She has proper fitting shoes currently.  History of hypertension: Blood pressure is well controlled.  History of anxiety  History of gastroesophageal reflux (GERD)  Former smoker  History of allergic rhinitis    Orders: No orders of the defined types were placed in this encounter.  No orders of the defined types were placed in  this encounter.   Face-to-face time spent with patient was 30 minutes. 50% of time was spent in counseling and coordination of care.  Follow-Up Instructions: Return in about 5 months (around 09/09/2017) for Rheumatoid arthritis.   Bo Merino, MD  Note - This record has been created using Editor, commissioning.  Chart creation errors have been sought, but may not always  have been located. Such creation errors do not reflect on  the standard of medical care.

## 2017-04-05 LAB — COMPLETE METABOLIC PANEL WITH GFR
ALBUMIN: 4 g/dL (ref 3.6–5.1)
ALK PHOS: 57 U/L (ref 33–130)
ALT: 15 U/L (ref 6–29)
AST: 19 U/L (ref 10–35)
BUN: 15 mg/dL (ref 7–25)
CALCIUM: 9.4 mg/dL (ref 8.6–10.4)
CHLORIDE: 102 mmol/L (ref 98–110)
CO2: 20 mmol/L (ref 20–31)
Creat: 0.92 mg/dL (ref 0.50–1.05)
GFR, EST AFRICAN AMERICAN: 80 mL/min (ref >=60)
GFR, EST NON AFRICAN AMERICAN: 69 mL/min (ref >=60)
Glucose, Bld: 97 mg/dL (ref 65–99)
POTASSIUM: 4.6 mmol/L (ref 3.5–5.3)
Sodium: 138 mmol/L (ref 135–146)
Total Bilirubin: 0.3 mg/dL (ref 0.2–1.2)
Total Protein: 6.7 g/dL (ref 6.1–8.1)

## 2017-04-05 NOTE — Telephone Encounter (Signed)
Attempted to contact patient and left message for patient to call the office.  

## 2017-04-05 NOTE — Progress Notes (Signed)
wnl

## 2017-04-08 NOTE — Telephone Encounter (Signed)
Prescription called to pharmacy on 04/02/17 for 30 day supply. Patient states she is okay to wait until next refill to switch to the Orencia pens.

## 2017-04-09 ENCOUNTER — Encounter: Payer: Self-pay | Admitting: Rheumatology

## 2017-04-09 ENCOUNTER — Ambulatory Visit (INDEPENDENT_AMBULATORY_CARE_PROVIDER_SITE_OTHER): Payer: Medicare Other | Admitting: Rheumatology

## 2017-04-09 VITALS — BP 93/60 | HR 79 | Resp 16 | Ht 67.5 in | Wt 175.0 lb

## 2017-04-09 DIAGNOSIS — Z8709 Personal history of other diseases of the respiratory system: Secondary | ICD-10-CM

## 2017-04-09 DIAGNOSIS — Z87891 Personal history of nicotine dependence: Secondary | ICD-10-CM

## 2017-04-09 DIAGNOSIS — M19041 Primary osteoarthritis, right hand: Secondary | ICD-10-CM | POA: Diagnosis not present

## 2017-04-09 DIAGNOSIS — M19071 Primary osteoarthritis, right ankle and foot: Secondary | ICD-10-CM | POA: Diagnosis not present

## 2017-04-09 DIAGNOSIS — M7552 Bursitis of left shoulder: Secondary | ICD-10-CM | POA: Diagnosis not present

## 2017-04-09 DIAGNOSIS — Z8659 Personal history of other mental and behavioral disorders: Secondary | ICD-10-CM | POA: Diagnosis not present

## 2017-04-09 DIAGNOSIS — Z8719 Personal history of other diseases of the digestive system: Secondary | ICD-10-CM | POA: Diagnosis not present

## 2017-04-09 DIAGNOSIS — M0579 Rheumatoid arthritis with rheumatoid factor of multiple sites without organ or systems involvement: Secondary | ICD-10-CM

## 2017-04-09 DIAGNOSIS — Z79899 Other long term (current) drug therapy: Secondary | ICD-10-CM | POA: Diagnosis not present

## 2017-04-09 DIAGNOSIS — Z8679 Personal history of other diseases of the circulatory system: Secondary | ICD-10-CM

## 2017-04-09 DIAGNOSIS — M19072 Primary osteoarthritis, left ankle and foot: Secondary | ICD-10-CM

## 2017-04-09 DIAGNOSIS — M19042 Primary osteoarthritis, left hand: Secondary | ICD-10-CM | POA: Diagnosis not present

## 2017-04-09 MED ORDER — ABATACEPT 125 MG/ML ~~LOC~~ SOAJ: 125.0000 mg | SUBCUTANEOUS | 0 refills | Status: DC

## 2017-04-09 NOTE — Progress Notes (Signed)
Patient today asked about switching from [place] to [place] [facility].  I educated patient on how to use the [employer].  I spoke to Richardson Landry, pharmacist, and changed patient's prescription that was called in on 04/02/17 from syringes to [employer].    Elisabeth Most, Pharm.D., BCPS, CPP Clinical Pharmacist Pager: [phone] Phone: [phone] 04/09/2017 10:58 AM

## 2017-04-09 NOTE — Patient Instructions (Addendum)
Standing Labs We placed an order today for your standing lab work.    Please come back and get your standing labs in October and every 3 months. Shoulder Exercises Ask your health care provider which exercises are safe for you. Do exercises exactly as told by your health care provider and adjust them as directed. It is normal to feel mild stretching, pulling, tightness, or discomfort as you do these exercises, but you should stop right away if you feel sudden pain or your pain gets worse.Do not begin these exercises until told by your health care provider. RANGE OF MOTION EXERCISES These exercises warm up your muscles and joints and improve the movement and flexibility of your shoulder. These exercises also help to relieve pain, numbness, and tingling. These exercises involve stretching your injured shoulder directly. Exercise A: Pendulum  1. Stand near a wall or a surface that you can hold onto for balance. 2. Bend at the waist and let your left / right arm hang straight down. Use your other arm to support you. Keep your back straight and do not lock your knees. 3. Relax your left / right arm and shoulder muscles, and move your hips and your trunk so your left / right arm swings freely. Your arm should swing because of the motion of your body, not because you are using your arm or shoulder muscles. 4. Keep moving your body so your arm swings in the following directions, as told by your health care provider: ? Side to side. ? Forward and backward. ? In clockwise and counterclockwise circles. 5. Continue each motion for __________ seconds, or for as long as told by your health care provider. 6. Slowly return to the starting position. Repeat __________ times. Complete this exercise __________ times a day. Exercise B:Flexion, Standing  1. Stand and hold a broomstick, a cane, or a similar object. Place your hands a little more than shoulder-width apart on the object. Your left / right hand should  be palm-up, and your other hand should be palm-down. 2. Keep your elbow straight and keep your shoulder muscles relaxed. Push the stick down with your healthy arm to raise your left / right arm in front of your body, and then over your head until you feel a stretch in your shoulder. ? Avoid shrugging your shoulder while you raise your arm. Keep your shoulder blade tucked down toward the middle of your back. 3. Hold for __________ seconds. 4. Slowly return to the starting position. Repeat __________ times. Complete this exercise __________ times a day. Exercise C: Abduction, Standing 1. Stand and hold a broomstick, a cane, or a similar object. Place your hands a little more than shoulder-width apart on the object. Your left / right hand should be palm-up, and your other hand should be palm-down. 2. While keeping your elbow straight and your shoulder muscles relaxed, push the stick across your body toward your left / right side. Raise your left / right arm to the side of your body and then over your head until you feel a stretch in your shoulder. ? Do not raise your arm above shoulder height, unless your health care provider tells you to do that. ? Avoid shrugging your shoulder while you raise your arm. Keep your shoulder blade tucked down toward the middle of your back. 3. Hold for __________ seconds. 4. Slowly return to the starting position. Repeat __________ times. Complete this exercise __________ times a day. Exercise D:Internal Rotation  1. Place your left /  right hand behind your back, palm-up. 2. Use your other hand to dangle an exercise band, a towel, or a similar object over your shoulder. Grasp the band with your left / right hand so you are holding onto both ends. 3. Gently pull up on the band until you feel a stretch in the front of your left / right shoulder. ? Avoid shrugging your shoulder while you raise your arm. Keep your shoulder blade tucked down toward the middle of your  back. 4. Hold for __________ seconds. 5. Release the stretch by letting go of the band and lowering your hands. Repeat __________ times. Complete this exercise __________ times a day. STRETCHING EXERCISES These exercises warm up your muscles and joints and improve the movement and flexibility of your shoulder. These exercises also help to relieve pain, numbness, and tingling. These exercises are done using your healthy shoulder to help stretch the muscles of your injured shoulder. Exercise E: Warehouse manager (External Rotation and Abduction)  1. Stand in a doorway with one of your feet slightly in front of the other. This is called a staggered stance. If you cannot reach your forearms to the door frame, stand facing a corner of a room. 2. Choose one of the following positions as told by your health care provider: ? Place your hands and forearms on the door frame above your head. ? Place your hands and forearms on the door frame at the height of your head. ? Place your hands on the door frame at the height of your elbows. 3. Slowly move your weight onto your front foot until you feel a stretch across your chest and in the front of your shoulders. Keep your head and chest upright and keep your abdominal muscles tight. 4. Hold for __________ seconds. 5. To release the stretch, shift your weight to your back foot. Repeat __________ times. Complete this stretch __________ times a day. Exercise F:Extension, Standing 1. Stand and hold a broomstick, a cane, or a similar object behind your back. ? Your hands should be a little wider than shoulder-width apart. ? Your palms should face away from your back. 2. Keeping your elbows straight and keeping your shoulder muscles relaxed, move the stick away from your body until you feel a stretch in your shoulder. ? Avoid shrugging your shoulders while you move the stick. Keep your shoulder blade tucked down toward the middle of your back. 3. Hold for __________  seconds. 4. Slowly return to the starting position. Repeat __________ times. Complete this exercise __________ times a day. STRENGTHENING EXERCISES These exercises build strength and endurance in your shoulder. Endurance is the ability to use your muscles for a long time, even after they get tired. Exercise G:External Rotation  1. Sit in a stable chair without armrests. 2. Secure an exercise band at elbow height on your left / right side. 3. Place a soft object, such as a folded towel or a small pillow, between your left / right upper arm and your body to move your elbow a few inches away (about 10 cm) from your side. 4. Hold the end of the band so it is tight and there is no slack. 5. Keeping your elbow pressed against the soft object, move your left / right forearm out, away from your abdomen. Keep your body steady so only your forearm moves. 6. Hold for __________ seconds. 7. Slowly return to the starting position. Repeat __________ times. Complete this exercise __________ times a day. Exercise H:Shoulder Abduction  1. Sit in a stable chair without armrests, or stand. 2. Hold a __________ weight in your left / right hand, or hold an exercise band with both hands. 3. Start with your arms straight down and your left / right palm facing in, toward your body. 4. Slowly lift your left / right hand out to your side. Do not lift your hand above shoulder height unless your health care provider tells you that this is safe. ? Keep your arms straight. ? Avoid shrugging your shoulder while you do this movement. Keep your shoulder blade tucked down toward the middle of your back. 5. Hold for __________ seconds. 6. Slowly lower your arm, and return to the starting position. Repeat __________ times. Complete this exercise __________ times a day. Exercise I:Shoulder Extension 1. Sit in a stable chair without armrests, or stand. 2. Secure an exercise band to a stable object in front of you where it  is at shoulder height. 3. Hold one end of the exercise band in each hand. Your palms should face each other. 4. Straighten your elbows and lift your hands up to shoulder height. 5. Step back, away from the secured end of the exercise band, until the band is tight and there is no slack. 6. Squeeze your shoulder blades together as you pull your hands down to the sides of your thighs. Stop when your hands are straight down by your sides. Do not let your hands go behind your body. 7. Hold for __________ seconds. 8. Slowly return to the starting position. Repeat __________ times. Complete this exercise __________ times a day. Exercise J:Standing Shoulder Row 1. Sit in a stable chair without armrests, or stand. 2. Secure an exercise band to a stable object in front of you so it is at waist height. 3. Hold one end of the exercise band in each hand. Your palms should be in a thumbs-up position. 4. Bend each of your elbows to an "L" shape (about 90 degrees) and keep your upper arms at your sides. 5. Step back until the band is tight and there is no slack. 6. Slowly pull your elbows back behind you. 7. Hold for __________ seconds. 8. Slowly return to the starting position. Repeat __________ times. Complete this exercise __________ times a day. Exercise K:Shoulder Press-Ups  1. Sit in a stable chair that has armrests. Sit upright, with your feet flat on the floor. 2. Put your hands on the armrests so your elbows are bent and your fingers are pointing forward. Your hands should be about even with the sides of your body. 3. Push down on the armrests and use your arms to lift yourself off of the chair. Straighten your elbows and lift yourself up as much as you comfortably can. ? Move your shoulder blades down, and avoid letting your shoulders move up toward your ears. ? Keep your feet on the ground. As you get stronger, your feet should support less of your body weight as you lift yourself up. 4. Hold  for __________ seconds. 5. Slowly lower yourself back into the chair. Repeat __________ times. Complete this exercise __________ times a day. Exercise L: Wall Push-Ups  1. Stand so you are facing a stable wall. Your feet should be about one arm-length away from the wall. 2. Lean forward and place your palms on the wall at shoulder height. 3. Keep your feet flat on the floor as you bend your elbows and lean forward toward the wall. 4. Hold for __________ seconds. 5.  Straighten your elbows to push yourself back to the starting position. Repeat __________ times. Complete this exercise __________ times a day. This information is not intended to replace advice given to you by your health care provider. Make sure you discuss any questions you have with your health care provider. Document Released: 07/18/2005 Document Revised: 05/28/2016 Document Reviewed: 05/15/2015 Elsevier Interactive Patient Education  [employer].   We have open lab Monday through Friday from 8:30-11:30 AM and 1:30-4 PM at the office of Dr. Bo Merino.   The office is located at [address], [place], [place], [place] [address] No appointment is necessary.   Labs are drawn by [facility].  You may receive a bill from [facility] for your lab work. If you have any questions regarding directions or hours of operation,  please call [phone].

## 2017-04-09 NOTE — Addendum Note (Signed)
Addended by: Cyndia Skeeters on: 04/09/2017 10:58 AM   Modules accepted: Orders

## 2017-04-25 ENCOUNTER — Telehealth: Payer: Self-pay | Admitting: Rheumatology

## 2017-04-25 MED ORDER — ABATACEPT 125 MG/ML ~~LOC~~ SOAJ: 125.0000 mg | SUBCUTANEOUS | 2 refills | Status: DC

## 2017-04-25 NOTE — Telephone Encounter (Signed)
I called patient to discuss.  She reports she was able to get Orencia filled but it was still for the syringes.  She is requesting we send over prescription for the Orencia ClickJets.    Last visit: 04/09/17 Next visit: 09/18/17 Labs: 04/04/17 CBC and CMP WNL; 11/09/16 TB Gold negative  Ok to refill per Dr. Estanislado Pandy.  Orencia Patient Assistance Prescription form was completed and faxed.    Elisabeth Most, Pharm.D., BCPS, CPP Clinical Pharmacist Pager: [phone] Phone: [phone] 04/25/2017 4:15 PM

## 2017-04-25 NOTE — Telephone Encounter (Signed)
Patient calling due to [place]. Patient states she was to  switch to click pen, but pharmacy did not have Rx. Please call to advise.

## 2017-05-24 DIAGNOSIS — H5201 Hypermetropia, right eye: Secondary | ICD-10-CM | POA: Diagnosis not present

## 2017-05-24 DIAGNOSIS — H52221 Regular astigmatism, right eye: Secondary | ICD-10-CM | POA: Diagnosis not present

## 2017-05-24 DIAGNOSIS — H5212 Myopia, left eye: Secondary | ICD-10-CM | POA: Diagnosis not present

## 2017-05-28 ENCOUNTER — Telehealth: Payer: Self-pay | Admitting: Rheumatology

## 2017-05-28 NOTE — Telephone Encounter (Signed)
Last Visit: 04/09/17 Next Visit: 09/18/17 Labs: 04/04/17 WNL TB Gold: 11/09/16 Neg   Okay to give sample of Orencia?

## 2017-05-28 NOTE — Telephone Encounter (Signed)
Patient has called to get Orencia from [facility], but worried she will not be able to meds with bad weather coming. Patient would like to know if she can get a sample. Please call to advise.

## 2017-05-28 NOTE — Telephone Encounter (Signed)
ok 

## 2017-05-28 NOTE — Telephone Encounter (Signed)
Patient advised she may come to the office and pick up sample.

## 2017-05-29 ENCOUNTER — Telehealth (INDEPENDENT_AMBULATORY_CARE_PROVIDER_SITE_OTHER): Payer: Self-pay | Admitting: Rheumatology

## 2017-05-29 NOTE — Telephone Encounter (Signed)
Disability form from [facility] ("unable to complete" per Dr. Arlean Hopping office) faxed [phone]

## 2017-06-25 ENCOUNTER — Other Ambulatory Visit: Payer: Self-pay | Admitting: Rheumatology

## 2017-06-26 NOTE — Telephone Encounter (Signed)
Last Visit: 04/09/17 Next Visit: 09/18/17  Okay to refill per Dr. Estanislado Pandy

## 2017-07-05 ENCOUNTER — Other Ambulatory Visit: Payer: Self-pay | Admitting: *Deleted

## 2017-07-05 DIAGNOSIS — Z23 Encounter for immunization: Secondary | ICD-10-CM | POA: Diagnosis not present

## 2017-07-05 NOTE — Telephone Encounter (Signed)
Received refill request from patient assistance [employer].  Last Visit: 04/09/17 Next Visit: 09/18/17 Labs: 04/04/17 WNL TB Gold: 11/09/16 Neg   Patient to come Monday and update labs.   Okay to refill per Dr. Estanislado Pandy

## 2017-07-08 ENCOUNTER — Other Ambulatory Visit: Payer: Self-pay

## 2017-07-08 DIAGNOSIS — Z79899 Other long term (current) drug therapy: Secondary | ICD-10-CM

## 2017-07-08 DIAGNOSIS — Z79631 Long term (current) use of antimetabolite agent: Secondary | ICD-10-CM

## 2017-07-08 LAB — COMPLETE METABOLIC PANEL WITH GFR
AG Ratio: 1.6 (calc) (ref 1.0–2.5)
ALBUMIN MSPROF: 4.1 g/dL (ref 3.6–5.1)
ALKALINE PHOSPHATASE (APISO): 68 U/L (ref 33–130)
ALT: 12 U/L (ref 6–29)
AST: 16 U/L (ref 10–35)
BUN / CREAT RATIO: 12 (calc) (ref 6–22)
BUN: 13 mg/dL (ref 7–25)
CO2: 28 mmol/L (ref 20–32)
CREATININE: 1.07 mg/dL — AB (ref 0.50–1.05)
Calcium: 9.7 mg/dL (ref 8.6–10.4)
Chloride: 106 mmol/L (ref 98–110)
GFR, EST AFRICAN AMERICAN: 67 mL/min/{1.73_m2} (ref >=60)
GFR, Est Non African American: 58 mL/min/{1.73_m2} — ABNORMAL LOW (ref >=60)
GLOBULIN: 2.5 g/dL (ref 1.9–3.7)
Glucose, Bld: 141 mg/dL — ABNORMAL HIGH (ref 65–99)
Potassium: 5.4 mmol/L — ABNORMAL HIGH (ref 3.5–5.3)
SODIUM: 141 mmol/L (ref 135–146)
TOTAL PROTEIN: 6.6 g/dL (ref 6.1–8.1)
Total Bilirubin: 0.2 mg/dL (ref 0.2–1.2)

## 2017-07-08 LAB — CBC WITH DIFFERENTIAL/PLATELET
BASOS ABS: 62 {cells}/uL (ref 0–200)
BASOS PCT: 0.7 %
Eosinophils Absolute: 194 cells/uL (ref 15–500)
Eosinophils Relative: 2.2 %
HEMATOCRIT: 36.1 % (ref 35.0–45.0)
HEMOGLOBIN: 12.6 g/dL (ref 11.7–15.5)
LYMPHS ABS: 3670 {cells}/uL (ref 850–3900)
MCH: 32.6 pg (ref 27.0–33.0)
MCHC: 34.9 g/dL (ref 32.0–36.0)
MCV: 93.5 fL (ref 80.0–100.0)
MPV: 11.5 fL (ref 7.5–12.5)
Monocytes Relative: 5.7 %
NEUTROS ABS: 4374 {cells}/uL (ref 1500–7800)
Neutrophils Relative %: 49.7 %
Platelets: 254 10*3/uL (ref 140–400)
RBC: 3.86 10*6/uL (ref 3.80–5.10)
RDW: 13 % (ref 11.0–15.0)
Total Lymphocyte: 41.7 %
WBC: 8.8 10*3/uL (ref 3.8–10.8)
WBCMIX: 502 {cells}/uL (ref 200–950)

## 2017-07-09 NOTE — Progress Notes (Signed)
Labs are stable. Creatinine is mildly elevated. Please avoid all NSAIDs.

## 2017-07-25 ENCOUNTER — Telehealth: Payer: Self-pay

## 2017-07-25 NOTE — Telephone Encounter (Signed)
Receive a fax from [place] stating that the patient is required to reapply to the program to be considered for continued assistance through the foundation. Medicare Part D patients may not be eligible in 2019 until they have spent 3% of their annual household income on prescription medications in 2019.   Called patient to update. She would like to apply for the PAN foundation in January like she did this year if funds are available. She will come by the office tomorrow to fill out the BMS application and bring proof of income to submit if funds are not available in January.   Kanija Remmel, [place], CPhT 10:06 AM

## 2017-08-06 DIAGNOSIS — Z1389 Encounter for screening for other disorder: Secondary | ICD-10-CM | POA: Diagnosis not present

## 2017-08-06 DIAGNOSIS — H6983 Other specified disorders of Eustachian tube, bilateral: Secondary | ICD-10-CM | POA: Diagnosis not present

## 2017-08-06 DIAGNOSIS — I10 Essential (primary) hypertension: Secondary | ICD-10-CM | POA: Diagnosis not present

## 2017-08-06 DIAGNOSIS — J019 Acute sinusitis, unspecified: Secondary | ICD-10-CM | POA: Diagnosis not present

## 2017-08-22 ENCOUNTER — Other Ambulatory Visit: Payer: Self-pay | Admitting: *Deleted

## 2017-08-22 MED ORDER — ABATACEPT 125 MG/ML ~~LOC~~ SOAJ: 125.0000 mg | SUBCUTANEOUS | 2 refills | Status: DC

## 2017-08-22 NOTE — Telephone Encounter (Signed)
Refill request received vis fax  Last Visit: 04/09/17 Next Visit: 09/18/17 Labs: 07/08/17 Stable TB Gold: 11/09/16 Neg   Okay to refill per Dr. Estanislado Pandy

## 2017-08-30 ENCOUNTER — Telehealth: Payer: Self-pay

## 2017-08-30 NOTE — Telephone Encounter (Signed)
Patient came by office requesting a copy of the BMS application for Orencia patient assistance. Reviewed our plan to apply to the PAN foundation in January. Patient states that she would like to submit the BMS application now and understands that she must spend 3% of her yearly income before they will start assisting her with medication. Application was faxed to BMS and a copy was made and handed to patient. Will update once we receive a response.   Cystal Shannahan, [place], CPhT 2:19 PM

## 2017-09-04 NOTE — Progress Notes (Signed)
Office Visit Note  Patient: Samantha Yates             Date of Birth: [birth year]           MRN: [id number]             PCP: Sharilyn Sites, MD Referring: Sharilyn Sites, MD Visit Date: 09/18/2017 Occupation: @GUAROCC @    Subjective:  Fatigue and joint pain.   History of Present Illness: Samantha Yates is a 56 y.o. female with history of sero positive rheumatoid arthritis and osteoarthritis overlap. She states she continues to have some joint stiffness but no joint swelling. She's doing quite well on combination of methotrexate 4 tablets per week along with Orencia SQ. She would like to discontinue methotrexate as she believes it causes fatigue. She continues to have some discomfort in her knee joints and ankle joints. She's been having some discomfort in her left hip which she describes in the trochanteric area. She also has difficulty climbing stairs due to knee joint discomfort.  Activities of Daily Living:  Patient reports morning stiffness for 30 minutes.   Patient Reports nocturnal pain. Left hip Difficulty dressing/grooming: Denies Difficulty climbing stairs: Reports Difficulty getting out of chair: Denies Difficulty using hands for taps, buttons, cutlery, and/or writing: Denies   Review of Systems  Constitutional: Positive for fatigue. Negative for night sweats, weight gain, weight loss and weakness.  HENT: Negative for mouth sores, trouble swallowing, trouble swallowing, mouth dryness and nose dryness.   Eyes: Negative for pain, redness, visual disturbance and dryness.  Respiratory: Negative for cough, shortness of breath and difficulty breathing.   Cardiovascular: Negative for chest pain, palpitations, hypertension, irregular heartbeat and swelling in legs/feet.  Gastrointestinal: Negative for blood in stool, constipation and diarrhea.  Endocrine: Negative for increased urination.  Genitourinary: Negative for vaginal dryness.  Musculoskeletal: Positive for  arthralgias, joint pain and morning stiffness. Negative for joint swelling, myalgias, muscle weakness, muscle tenderness and myalgias.  Skin: Negative for color change, rash, hair loss, skin tightness, ulcers and sensitivity to sunlight.  Allergic/Immunologic: Negative for susceptible to infections.  Neurological: Negative for dizziness, memory loss and night sweats.  Hematological: Negative for swollen glands.  Psychiatric/Behavioral: Negative for depressed mood and sleep disturbance. The patient is not nervous/anxious.     PMFS History:  Patient Active Problem List   Diagnosis Date Noted  . High risk medication use 11/08/2016  . Primary osteoarthritis of both hands 11/08/2016  . Primary osteoarthritis of both feet 11/08/2016  . History of gastroesophageal reflux (GERD) 11/08/2016  . History of hypertension 11/08/2016  . History of anxiety 11/08/2016  . History of allergic rhinitis 11/08/2016  . Former smoker 11/08/2016  . GERD (gastroesophageal reflux disease) 09/17/2008  . Rheumatoid arthritis ([place]) 09/17/2006  . Anxiety 09/17/2006  . Hypertension 09/17/2005  . Allergic rhinitis 09/17/1996    Past Medical History:  Diagnosis Date  . Arthritis   . Hypertension   . Rheumatoid arthritis ([place])     History reviewed. No pertinent family history. Past Surgical History:  Procedure Laterality Date  . TONSILLECTOMY     Social History   Social History Narrative  . Not on file     Objective: Vital Signs: BP (!) 104/58 (BP Location: Left Arm, Patient Position: Sitting, Cuff Size: Normal)   Pulse 71   Resp 16   Ht 5' 7.5" (1.715 m)   Wt 180 lb (81.6 kg)   BMI 27.78 kg/m    Physical Exam  Constitutional: She is  oriented to person, place, and time. She appears well-developed and well-nourished.  HENT:  Head: Normocephalic and atraumatic.  Eyes: Conjunctivae and EOM are normal.  Neck: Normal range of motion.  Cardiovascular: Normal rate, regular rhythm, normal heart sounds  and intact distal pulses.  Pulmonary/Chest: Effort normal and breath sounds normal.  Abdominal: Soft. Bowel sounds are normal.  Lymphadenopathy:    She has no cervical adenopathy.  Neurological: She is alert and oriented to person, place, and time.  Skin: Skin is warm and dry. Capillary refill takes less than 2 seconds.  Psychiatric: She has a normal mood and affect. Her behavior is normal.  Nursing note and vitals reviewed.    Musculoskeletal Exam: C-spine and thoracic lumbar spine good range of motion. Shoulder joints elbow joints wrist joints are good range of motion. She has DIP PIP and CMC thickening. No synovitis was noted. Hip joints and knee joints and good range of motion. She has some discomfort range of motion of her knee joints. She also had tenderness across her MTPs without any synovitis.  CDAI Exam: CDAI Homunculus Exam:   Joint Counts:  CDAI Tender Joint count: 0 CDAI Swollen Joint count: 0  Global Assessments:  Patient Global Assessment: 2 Provider Global Assessment: 2  CDAI Calculated Score: 4    Investigation: No additional findings.TB Gold: 11/09/2016 Negative  CBC Latest Ref Rng & Units 07/08/2017 04/04/2017 11/09/2016  WBC 3.8 - 10.8 Thousand/uL 8.8 8.6 6.9  Hemoglobin 11.7 - 15.5 g/dL 12.6 12.7 12.8  Hematocrit 35.0 - 45.0 % 36.1 38.1 37.9  Platelets 140 - 400 Thousand/uL 254 245 231   CMP Latest Ref Rng & Units 07/08/2017 04/04/2017 11/09/2016  Glucose 65 - 99 mg/dL 141(H) 97 89  BUN 7 - 25 mg/dL 13 15 17   Creatinine 0.50 - 1.05 mg/dL 1.07(H) 0.92 1.08(H)  Sodium 135 - 146 mmol/L 141 138 136  Potassium 3.5 - 5.3 mmol/L 5.4(H) 4.6 5.0  Chloride 98 - 110 mmol/L 106 102 103  CO2 20 - 32 mmol/L 28 20 24   Calcium 8.6 - 10.4 mg/dL 9.7 9.4 9.5  Total Protein 6.1 - 8.1 g/dL 6.6 6.7 6.6  Total Bilirubin 0.2 - 1.2 mg/dL 0.2 0.3 0.5  Alkaline Phos 33 - 130 U/L - 57 69  AST 10 - 35 U/L 16 19 19   ALT 6 - 29 U/L 12 15 16     Imaging: No results  found.  Speciality Comments: No specialty comments available.    Procedures:  No procedures performed Allergies: Patient has no known allergies.   Assessment / Plan:     Visit Diagnoses: Rheumatoid arthritis involving multiple sites with positive rheumatoid factor (HCC) - +RF,+anti-CCP with nodulosis. She's doing much better on combination of methotrexate and Orencia. She would like to come off methotrexate as it is causing fatigue. I was in agreement. We will stop the methotrexate and see how she does on monotherapy with Orencia.  High risk medication use - Methotrexate 4 tabs po q week, Folic acid 2mg  po qd and Orencia sub q week - Plan: CBC with Differential/Platelet, COMPLETE METABOLIC PANEL WITH GFR, QuantiFERON-TB Gold Plus. We will check labs today and then every 3 months to monitor for drug toxicity.  Pain in both hands - Plan: XR Hand 2 View Right, XR Hand 2 View Left. X-rays are consistent with  rheumatoid arthritis and osteoarthritis overlap with no radiographic progression.  Primary osteoarthritis of both hands  Chronic pain of both knees - Plan: XR KNEE 3 VIEW  RIGHT, XR KNEE 3 VIEW LEFT. The x-rays revealed moderate osteoarthritis and moderate to severe chondromalacia patella. Knee joint and muscle strengthening exercises were discussed and handout was given. Weight loss was also discussed.  Pain in both feet - Plan: XR Foot 2 Views Right, XR Foot 2 Views Left. The x-rays reveal erosive osteoarthritis and osteoarthritis overlap without any radiographic progression.  Primary osteoarthritis of both feet: Proper fitting shoes were discussed.  Other fatigue - Plan: TSH, VITAMIN D 25 Hydroxy (Vit-D Deficiency, Fractures)  History of anxiety  History of hypertension  History of gastroesophageal reflux (GERD)  History of allergic rhinitis  Former smoker    Orders: Orders Placed This Encounter  Procedures  . XR Hand 2 View Right  . XR Hand 2 View Left  . XR Foot 2  Views Right  . XR Foot 2 Views Left  . XR KNEE 3 VIEW RIGHT  . XR KNEE 3 VIEW LEFT  . CBC with Differential/Platelet  . COMPLETE METABOLIC PANEL WITH GFR  . QuantiFERON-TB Gold Plus  . TSH  . VITAMIN D 25 Hydroxy (Vit-D Deficiency, Fractures)   No orders of the defined types were placed in this encounter.   Face-to-face time spent with patient was 30 minutes. Greater than 50% of time was spent in counseling and coordination of care.  Follow-Up Instructions: Return in about 5 months (around 02/16/2018) for Rheumatoid arthritis.   Bo Merino, MD  Note - This record has been created using Editor, commissioning.  Chart creation errors have been sought, but may not always  have been located. Such creation errors do not reflect on  the standard of medical care.

## 2017-09-18 ENCOUNTER — Ambulatory Visit (INDEPENDENT_AMBULATORY_CARE_PROVIDER_SITE_OTHER): Payer: Self-pay

## 2017-09-18 ENCOUNTER — Ambulatory Visit: Payer: Medicare Other | Admitting: Rheumatology

## 2017-09-18 ENCOUNTER — Encounter: Payer: Self-pay | Admitting: Rheumatology

## 2017-09-18 VITALS — BP 104/58 | HR 71 | Resp 16 | Ht 67.5 in | Wt 180.0 lb

## 2017-09-18 DIAGNOSIS — Z79899 Other long term (current) drug therapy: Secondary | ICD-10-CM | POA: Diagnosis not present

## 2017-09-18 DIAGNOSIS — M79672 Pain in left foot: Secondary | ICD-10-CM

## 2017-09-18 DIAGNOSIS — M79641 Pain in right hand: Secondary | ICD-10-CM

## 2017-09-18 DIAGNOSIS — M25562 Pain in left knee: Secondary | ICD-10-CM | POA: Diagnosis not present

## 2017-09-18 DIAGNOSIS — M19042 Primary osteoarthritis, left hand: Secondary | ICD-10-CM

## 2017-09-18 DIAGNOSIS — M79642 Pain in left hand: Secondary | ICD-10-CM

## 2017-09-18 DIAGNOSIS — Z8709 Personal history of other diseases of the respiratory system: Secondary | ICD-10-CM | POA: Diagnosis not present

## 2017-09-18 DIAGNOSIS — G8929 Other chronic pain: Secondary | ICD-10-CM

## 2017-09-18 DIAGNOSIS — M79671 Pain in right foot: Secondary | ICD-10-CM | POA: Diagnosis not present

## 2017-09-18 DIAGNOSIS — M0579 Rheumatoid arthritis with rheumatoid factor of multiple sites without organ or systems involvement: Secondary | ICD-10-CM

## 2017-09-18 DIAGNOSIS — M19072 Primary osteoarthritis, left ankle and foot: Secondary | ICD-10-CM

## 2017-09-18 DIAGNOSIS — Z8659 Personal history of other mental and behavioral disorders: Secondary | ICD-10-CM | POA: Diagnosis not present

## 2017-09-18 DIAGNOSIS — Z87891 Personal history of nicotine dependence: Secondary | ICD-10-CM

## 2017-09-18 DIAGNOSIS — Z8719 Personal history of other diseases of the digestive system: Secondary | ICD-10-CM

## 2017-09-18 DIAGNOSIS — M19071 Primary osteoarthritis, right ankle and foot: Secondary | ICD-10-CM

## 2017-09-18 DIAGNOSIS — Z8679 Personal history of other diseases of the circulatory system: Secondary | ICD-10-CM | POA: Diagnosis not present

## 2017-09-18 DIAGNOSIS — M19041 Primary osteoarthritis, right hand: Secondary | ICD-10-CM

## 2017-09-18 DIAGNOSIS — M25561 Pain in right knee: Secondary | ICD-10-CM | POA: Diagnosis not present

## 2017-09-18 DIAGNOSIS — R5383 Other fatigue: Secondary | ICD-10-CM

## 2017-09-18 NOTE — Patient Instructions (Addendum)
Standing Labs We placed an order today for your standing lab work.    Please come back and get your standing labs in April and every 3 months  We have open lab Monday through Friday from 8:30-11:30 AM and 1:30-4 PM at the office of Dr. Bo Merino.   The office is located at [address] [place], [place], [place] [address] No appointment is necessary.   Labs are drawn by [facility].  You may receive a bill from [facility] for your lab work. If you have any questions regarding directions or hours of operation,  please call [phone].    Knee Exercises Ask your health care provider which exercises are safe for you. Do exercises exactly as told by your health care provider and adjust them as directed. It is normal to feel mild stretching, pulling, tightness, or discomfort as you do these exercises, but you should stop right away if you feel sudden pain or your pain gets worse.Do not begin these exercises until told by your health care provider. STRETCHING AND RANGE OF MOTION EXERCISES These exercises warm up your muscles and joints and improve the movement and flexibility of your knee. These exercises also help to relieve pain, numbness, and tingling. Exercise A: Knee Extension, Prone 1. Lie on your abdomen on a bed. 2. Place your left / right knee just beyond the edge of the surface so your knee is not on the bed. You can put a towel under your left / right thigh just above your knee for comfort. 3. Relax your leg muscles and allow gravity to straighten your knee. You should feel a stretch behind your left / right knee. 4. Hold this position for __________ seconds. 5. Scoot up so your knee is supported between repetitions. Repeat __________ times. Complete this stretch __________ times a day. Exercise B: Knee Flexion, Active  1. Lie on your back with both knees straight. If this causes back discomfort, bend your left / right knee so your foot is flat on the floor. 2. Slowly slide your  left / right heel back toward your buttocks until you feel a gentle stretch in the front of your knee or thigh. 3. Hold this position for __________ seconds. 4. Slowly slide your left / right heel back to the starting position. Repeat __________ times. Complete this exercise __________ times a day. Exercise C: Quadriceps, Prone  1. Lie on your abdomen on a firm surface, such as a bed or padded floor. 2. Bend your left / right knee and hold your ankle. If you cannot reach your ankle or pant leg, loop a belt around your foot and grab the belt instead. 3. Gently pull your heel toward your buttocks. Your knee should not slide out to the side. You should feel a stretch in the front of your thigh and knee. 4. Hold this position for __________ seconds. Repeat __________ times. Complete this stretch __________ times a day. Exercise D: Hamstring, Supine 1. Lie on your back. 2. Loop a belt or towel over the ball of your left / right foot. The ball of your foot is on the walking surface, right under your toes. 3. Straighten your left / right knee and slowly pull on the belt to raise your leg until you feel a gentle stretch behind your knee. ? Do not let your left / right knee bend while you do this. ? Keep your other leg flat on the floor. 4. Hold this position for __________ seconds. Repeat __________ times. Complete  this stretch __________ times a day. STRENGTHENING EXERCISES These exercises build strength and endurance in your knee. Endurance is the ability to use your muscles for a long time, even after they get tired. Exercise E: Quadriceps, Isometric  1. Lie on your back with your left / right leg extended and your other knee bent. Put a rolled towel or small pillow under your knee if told by your health care provider. 2. Slowly tense the muscles in the front of your left / right thigh. You should see your kneecap slide up toward your hip or see increased dimpling just above the knee. This motion  will push the back of the knee toward the floor. 3. For __________ seconds, keep the muscle as tight as you can without increasing your pain. 4. Relax the muscles slowly and completely. Repeat __________ times. Complete this exercise __________ times a day. Exercise F: Straight Leg Raises - Quadriceps 1. Lie on your back with your left / right leg extended and your other knee bent. 2. Tense the muscles in the front of your left / right thigh. You should see your kneecap slide up or see increased dimpling just above the knee. Your thigh may even shake a bit. 3. Keep these muscles tight as you raise your leg 4-6 inches (10-15 cm) off the floor. Do not let your knee bend. 4. Hold this position for __________ seconds. 5. Keep these muscles tense as you lower your leg. 6. Relax your muscles slowly and completely after each repetition. Repeat __________ times. Complete this exercise __________ times a day. Exercise G: Hamstring, Isometric 1. Lie on your back on a firm surface. 2. Bend your left / right knee approximately __________ degrees. 3. Dig your left / right heel into the surface as if you are trying to pull it toward your buttocks. Tighten the muscles in the back of your thighs to dig as hard as you can without increasing any pain. 4. Hold this position for __________ seconds. 5. Release the tension gradually and allow your muscles to relax completely for __________ seconds after each repetition. Repeat __________ times. Complete this exercise __________ times a day. Exercise H: Hamstring Curls  If told by your health care provider, do this exercise while wearing ankle weights. Begin with __________ weights. Then increase the weight by 1 lb (0.5 kg) increments. Do not wear ankle weights that are more than __________. 1. Lie on your abdomen with your legs straight. 2. Bend your left / right knee as far as you can without feeling pain. Keep your hips flat against the floor. 3. Hold this  position for __________ seconds. 4. Slowly lower your leg to the starting position.  Repeat __________ times. Complete this exercise __________ times a day. Exercise I: Squats (Quadriceps) 1. Stand in front of a table, with your feet and knees pointing straight ahead. You may rest your hands on the table for balance but not for support. 2. Slowly bend your knees and lower your hips like you are going to sit in a chair. ? Keep your weight over your heels, not over your toes. ? Keep your lower legs upright so they are parallel with the table legs. ? Do not let your hips go lower than your knees. ? Do not bend lower than told by your health care provider. ? If your knee pain increases, do not bend as low. 3. Hold the squat position for __________ seconds. 4. Slowly push with your legs to return to standing. Do  not use your hands to pull yourself to standing. Repeat __________ times. Complete this exercise __________ times a day. Exercise J: Wall Slides (Quadriceps)  1. Lean your back against a smooth wall or door while you walk your feet out 18-24 inches (46-61 cm) from it. 2. Place your feet hip-width apart. 3. Slowly slide down the wall or door until your knees bend __________ degrees. Keep your knees over your heels, not over your toes. Keep your knees in line with your hips. 4. Hold for __________ seconds. Repeat __________ times. Complete this exercise __________ times a day. Exercise K: Straight Leg Raises - Hip Abductors 1. Lie on your side with your left / right leg in the top position. Lie so your head, shoulder, knee, and hip line up. You may bend your bottom knee to help you keep your balance. 2. Roll your hips slightly forward so your hips are stacked directly over each other and your left / right knee is facing forward. 3. Leading with your heel, lift your top leg 4-6 inches (10-15 cm). You should feel the muscles in your outer hip lifting. ? Do not let your foot drift  forward. ? Do not let your knee roll toward the ceiling. 4. Hold this position for __________ seconds. 5. Slowly return your leg to the starting position. 6. Let your muscles relax completely after each repetition. Repeat __________ times. Complete this exercise __________ times a day. Exercise L: Straight Leg Raises - Hip Extensors 1. Lie on your abdomen on a firm surface. You can put a pillow under your hips if that is more comfortable. 2. Tense the muscles in your buttocks and lift your left / right leg about 4-6 inches (10-15 cm). Keep your knee straight as you lift your leg. 3. Hold this position for __________ seconds. 4. Slowly lower your leg to the starting position. 5. Let your leg relax completely after each repetition. Repeat __________ times. Complete this exercise __________ times a day. This information is not intended to replace advice given to you by your health care provider. Make sure you discuss any questions you have with your health care provider. Document Released: 07/18/2005 Document Revised: 05/28/2016 Document Reviewed: 07/10/2015 Elsevier Interactive Patient Education  2018 [employer].

## 2017-09-19 NOTE — Progress Notes (Signed)
WNLs

## 2017-09-20 ENCOUNTER — Telehealth: Payer: Self-pay

## 2017-09-20 LAB — VITAMIN D 25 HYDROXY (VIT D DEFICIENCY, FRACTURES): Vit D, 25-Hydroxy: 30 ng/mL (ref 30–100)

## 2017-09-20 LAB — COMPLETE METABOLIC PANEL WITH GFR
AG RATIO: 1.8 (calc) (ref 1.0–2.5)
ALT: 13 U/L (ref 6–29)
AST: 15 U/L (ref 10–35)
Albumin: 4 g/dL (ref 3.6–5.1)
Alkaline phosphatase (APISO): 59 U/L (ref 33–130)
BILIRUBIN TOTAL: 0.5 mg/dL (ref 0.2–1.2)
BUN: 15 mg/dL (ref 7–25)
CALCIUM: 9.2 mg/dL (ref 8.6–10.4)
CHLORIDE: 104 mmol/L (ref 98–110)
CO2: 28 mmol/L (ref 20–32)
Creat: 0.91 mg/dL (ref 0.50–1.05)
GFR, Est African American: 81 mL/min/{1.73_m2} (ref >=60)
GFR, Est Non African American: 70 mL/min/{1.73_m2} (ref >=60)
GLOBULIN: 2.2 g/dL (ref 1.9–3.7)
Glucose, Bld: 92 mg/dL (ref 65–99)
POTASSIUM: 4.7 mmol/L (ref 3.5–5.3)
SODIUM: 139 mmol/L (ref 135–146)
TOTAL PROTEIN: 6.2 g/dL (ref 6.1–8.1)

## 2017-09-20 LAB — CBC WITH DIFFERENTIAL/PLATELET
Basophils Absolute: 59 cells/uL (ref 0–200)
Basophils Relative: 0.9 %
EOS ABS: 150 {cells}/uL (ref 15–500)
Eosinophils Relative: 2.3 %
HEMATOCRIT: 36.5 % (ref 35.0–45.0)
Hemoglobin: 12.2 g/dL (ref 11.7–15.5)
LYMPHS ABS: 2405 {cells}/uL (ref 850–3900)
MCH: 31.8 pg (ref 27.0–33.0)
MCHC: 33.4 g/dL (ref 32.0–36.0)
MCV: 95.1 fL (ref 80.0–100.0)
MPV: 11.2 fL (ref 7.5–12.5)
Monocytes Relative: 8 %
NEUTROS PCT: 51.8 %
Neutro Abs: 3367 cells/uL (ref 1500–7800)
PLATELETS: 217 10*3/uL (ref 140–400)
RBC: 3.84 10*6/uL (ref 3.80–5.10)
RDW: 12.8 % (ref 11.0–15.0)
Total Lymphocyte: 37 %
WBC: 6.5 10*3/uL (ref 3.8–10.8)
WBCMIX: 520 {cells}/uL (ref 200–950)

## 2017-09-20 LAB — QUANTIFERON-TB GOLD PLUS
NIL: 0.17 IU/mL
QUANTIFERON-TB GOLD PLUS: NEGATIVE
TB2-NIL: <0 IU/mL

## 2017-09-20 LAB — TSH: TSH: 1.48 m[IU]/L (ref 0.40–4.50)

## 2017-09-20 NOTE — Telephone Encounter (Signed)
Called [employer] to check the status of patients application. Spoke with Morton Stall who states that the patient has been approved through 09/16/2018.   Called patient to update. Patient voices understanding and denied any questions at this time.  Will send document to scan center once received.   Alex Mcmanigal, [place], CPhT 3:36 PM

## 2017-09-25 ENCOUNTER — Telehealth: Payer: Self-pay | Admitting: Rheumatology

## 2017-09-25 NOTE — Telephone Encounter (Signed)
Patient calling to request labwork from January to be sent to PCP Dr. Hilma Favors. Per patient PCP received October 2018 labs instead of Jan 2019 labs.

## 2017-09-25 NOTE — Telephone Encounter (Signed)
Lab work has been faxed to PCP.

## 2017-10-09 DIAGNOSIS — R350 Frequency of micturition: Secondary | ICD-10-CM | POA: Diagnosis not present

## 2017-10-09 DIAGNOSIS — N390 Urinary tract infection, site not specified: Secondary | ICD-10-CM | POA: Diagnosis not present

## 2017-11-12 ENCOUNTER — Other Ambulatory Visit: Payer: Self-pay | Admitting: *Deleted

## 2017-11-12 NOTE — Telephone Encounter (Signed)
Refill request received from patient assistance program.  Last Visit: 09/18/17 Next visit: 03/31/18 Labs: 09/18/17 WNL TB Gold: 09/18/17 Neg   Okay to refill per Dr. Estanislado Pandy. Faxed to patient assistance foundation.

## 2018-01-08 DIAGNOSIS — I10 Essential (primary) hypertension: Secondary | ICD-10-CM | POA: Diagnosis not present

## 2018-01-08 DIAGNOSIS — J309 Allergic rhinitis, unspecified: Secondary | ICD-10-CM | POA: Diagnosis not present

## 2018-01-08 DIAGNOSIS — Z1389 Encounter for screening for other disorder: Secondary | ICD-10-CM | POA: Diagnosis not present

## 2018-01-08 DIAGNOSIS — R05 Cough: Secondary | ICD-10-CM | POA: Diagnosis not present

## 2018-01-08 DIAGNOSIS — J302 Other seasonal allergic rhinitis: Secondary | ICD-10-CM | POA: Diagnosis not present

## 2018-01-13 ENCOUNTER — Encounter: Payer: Self-pay | Admitting: Internal Medicine

## 2018-02-06 ENCOUNTER — Other Ambulatory Visit: Payer: Self-pay

## 2018-02-06 DIAGNOSIS — Z79899 Other long term (current) drug therapy: Secondary | ICD-10-CM | POA: Diagnosis not present

## 2018-02-07 LAB — COMPLETE METABOLIC PANEL WITH GFR
AG RATIO: 1.6 (calc) (ref 1.0–2.5)
ALBUMIN MSPROF: 3.8 g/dL (ref 3.6–5.1)
ALT: 16 U/L (ref 6–29)
AST: 19 U/L (ref 10–35)
Alkaline phosphatase (APISO): 59 U/L (ref 33–130)
BILIRUBIN TOTAL: 0.3 mg/dL (ref 0.2–1.2)
BUN: 15 mg/dL (ref 7–25)
CALCIUM: 9.5 mg/dL (ref 8.6–10.4)
CHLORIDE: 103 mmol/L (ref 98–110)
CO2: 28 mmol/L (ref 20–32)
Creat: 0.83 mg/dL (ref 0.50–1.05)
GFR, EST AFRICAN AMERICAN: 90 mL/min/{1.73_m2} (ref >=60)
GFR, EST NON AFRICAN AMERICAN: 78 mL/min/{1.73_m2} (ref >=60)
Globulin: 2.4 g/dL (calc) (ref 1.9–3.7)
Glucose, Bld: 95 mg/dL (ref 65–99)
Potassium: 4.5 mmol/L (ref 3.5–5.3)
Sodium: 137 mmol/L (ref 135–146)
TOTAL PROTEIN: 6.2 g/dL (ref 6.1–8.1)

## 2018-02-07 LAB — CBC WITH DIFFERENTIAL/PLATELET
BASOS PCT: 0.4 %
Basophils Absolute: 33 cells/uL (ref 0–200)
Eosinophils Absolute: 158 cells/uL (ref 15–500)
Eosinophils Relative: 1.9 %
HCT: 34.5 % — ABNORMAL LOW (ref 35.0–45.0)
HEMOGLOBIN: 12.1 g/dL (ref 11.7–15.5)
LYMPHS ABS: 3403 {cells}/uL (ref 850–3900)
MCH: 31.8 pg (ref 27.0–33.0)
MCHC: 35.1 g/dL (ref 32.0–36.0)
MCV: 90.6 fL (ref 80.0–100.0)
MPV: 11.2 fL (ref 7.5–12.5)
Monocytes Relative: 6.8 %
NEUTROS ABS: 4142 {cells}/uL (ref 1500–7800)
Neutrophils Relative %: 49.9 %
Platelets: 204 10*3/uL (ref 140–400)
RBC: 3.81 10*6/uL (ref 3.80–5.10)
RDW: 12.7 % (ref 11.0–15.0)
Total Lymphocyte: 41 %
WBC: 8.3 10*3/uL (ref 3.8–10.8)
WBCMIX: 564 {cells}/uL (ref 200–950)

## 2018-02-07 NOTE — Progress Notes (Signed)
Notified pt of lab results 

## 2018-02-18 ENCOUNTER — Ambulatory Visit (INDEPENDENT_AMBULATORY_CARE_PROVIDER_SITE_OTHER): Payer: Self-pay

## 2018-02-18 DIAGNOSIS — Z1211 Encounter for screening for malignant neoplasm of colon: Secondary | ICD-10-CM

## 2018-02-18 MED ORDER — NA SULFATE-K SULFATE-MG SULF 17.5-3.13-1.6 GM/177ML PO SOLN: 1.0000 | ORAL | 0 refills | Status: DC

## 2018-02-18 NOTE — Progress Notes (Signed)
Gastroenterology Pre-Procedure Review  Request Date:02/18/18 Requesting Physician: Larene Pickett ( no previous tcs)  PATIENT REVIEW QUESTIONS: The patient responded to the following health history questions as indicated:    1. Diabetes Melitis: no 2. Joint replacements in the past 12 months: no 3. Major health problems in the past 3 months: no 4. Has an artificial valve or MVP: no 5. Has a defibrillator: no 6. Has been advised in past to take antibiotics in advance of a procedure like teeth cleaning: yes (dentist) 7. Family history of colon cancer: no  8. Alcohol Use: no 9. History of sleep apnea: no  10. History of coronary artery or other vascular stents placed within the last 12 months: no 11. History of any prior anesthesia complications: no    MEDICATIONS & ALLERGIES:    Patient reports the following regarding taking any blood thinners:   Plavix? no Aspirin? yes (81mg ) Coumadin? no Brilinta? no Xarelto? no Eliquis? no Pradaxa? no Savaysa? no Effient? no  Patient confirms/reports the following medications:  Current Outpatient Medications  Medication Sig Dispense Refill  . Abatacept (ORENCIA CLICKJECT) 169 MG/ML SOAJ Inject 125 mg into the skin once a week. 4 Syringe 2  . Acetaminophen (TYLENOL ARTHRITIS PAIN PO) Take by mouth as needed.    Marland Kitchen aspirin EC 81 MG tablet Take 81 mg by mouth daily.    . citalopram (CELEXA) 40 MG tablet Take 1 tablet (40 mg total) by mouth at bedtime.    . enalapril (VASOTEC) 20 MG tablet Take 10 mg by mouth at bedtime.     . flintstones complete (FLINTSTONES) 60 MG chewable tablet Chew 1 tablet by mouth daily with breakfast.    . fluticasone (FLONASE) 50 MCG/ACT nasal spray Place into both nostrils daily.    . folic acid (FOLVITE) 1 MG tablet Take 1 mg by mouth daily.    Marland Kitchen loratadine (CLARITIN) 10 MG tablet Take 10 mg by mouth daily.    . montelukast (SINGULAIR) 10 MG tablet Take 10 mg by mouth at bedtime.    . pantoprazole (PROTONIX) 40 MG tablet  Take 40 mg by mouth daily.    . vitamin B-12 (CYANOCOBALAMIN) 500 MCG tablet Take 500 mcg by mouth daily.    . vitamin B-6 (PYRIDOXINE) 25 MG tablet Take 25 mg by mouth daily.     No current facility-administered medications for this visit.     Patient confirms/reports the following allergies:  Allergies  Allergen Reactions  . Sulfamethoxazole     No orders of the defined types were placed in this encounter.   [place] INFORMATION Primary Insurance: [employer] Medicare ,  ID #: [id number] Pre-Cert / Josem Kaufmann required: no   SCHEDULE INFORMATION: Procedure has been scheduled as follows:  Date 04/09/18:, Time: 9:30 Location:[facility]  This Gastroenterology Pre-Precedure Review Form is being routed to the following provider(s): Roseanne Kaufman NP

## 2018-02-18 NOTE — Patient Instructions (Addendum)
Samantha Yates   November 23, 1960 MRN: [id number]    Procedure Date: 04/09/18 Time to register: 8:30am Place to register: [facility] Procedure Time: 9:30am Scheduled provider: Barney Drain, MD  PREPARATION FOR COLONOSCOPY WITH TRI-LYTE SPLIT PREP  Please notify us immediately if you are diabetic, take iron supplements, or if you are on Coumadin or any other blood thinners.     You will need to purchase 1 fleet enema and 1 box of Bisacodyl '5mg'$  tablets.    1 DAY BEFORE PROCEDURE:  DATE: 04/08/18   DAY: Tuesday  clear liquids the entire day - NO SOLID FOOD.    At 2:00 pm:  Take 2 Bisacodyl tablets.   At 4:00pm:  Start drinking your solution. Make sure you mix well per instructions on the bottle. Try to drink 1 (one) 8 ounce glass every 10-15 minutes until you have consumed HALF the jug. You should complete by 6:00pm.You must keep the left over solution refrigerated until completed next day.  Continue clear liquids. You must drink plenty of clear liquids to prevent dehyration and kidney failure.  EXCEPTION: If you take medications for your heart, blood pressure or breathing, you may take these medications with a small amount of clear liquid.    DAY OF PROCEDURE:   DATE: 04/09/18   DAY: Wednesday   Five hours before your procedure time @ 4:30am:  Finish remaining amout of bowel prep, drinking 1 (one) 8 ounce glass every 10-15 minutes until complete. You have two hours to consume remaining prep.   Three hours before your procedure time '@6'$ :30am:  Nothing by mouth.   At least one hour before going to the hospital:  Give yourself one Fleet enema. You may take your morning medications with sip of water unless we have instructed otherwise.      Please see below for Dietary Information.  CLEAR LIQUIDS INCLUDE:  Water Jello (NOT red in color)   Ice Popsicles (NOT red in color)   Tea (sugar ok, no milk/cream) Powdered fruit flavored drinks  Coffee (sugar ok, no milk/cream)  Gatorade/ Lemonade/ Kool-Aid  (NOT red in color)   Juice: apple, white grape, white cranberry Soft drinks  Clear bullion, consomme, broth (fat free beef/chicken/vegetable)  Carbonated beverages (any kind)  Strained chicken noodle soup Hard Candy   Remember: Clear liquids are liquids that will allow you to see your fingers on the other side of a clear glass. Be sure liquids are NOT red in color, and not cloudy, but CLEAR.  DO NOT EAT OR DRINK ANY OF THE FOLLOWING:  Dairy products of any kind   Cranberry juice Tomato juice / V8 juice   Grapefruit juice Orange juice     Red grape juice  Do not eat any solid foods, including such foods as: cereal, oatmeal, yogurt, fruits, vegetables, creamed soups, eggs, bread, crackers, pureed foods in a blender, etc.   HELPFUL HINTS FOR DRINKING PREP SOLUTION:   Make sure prep is extremely cold. Mix and refrigerate the the morning of the prep. You may also put in the freezer.   You may try mixing some Crystal Light or Country Time Lemonade if you prefer. Mix in small amounts; add more if necessary.  Try drinking through a straw  Rinse mouth with water or a mouthwash between glasses, to remove after-taste.  Try sipping on a cold beverage /ice/ popsicles between glasses of prep.  Place a piece of sugar-free hard candy in mouth between glasses.  If you become nauseated,  try consuming smaller amounts, or stretch out the time between glasses. Stop for 30-60 minutes, then slowly start back drinking.        OTHER INSTRUCTIONS  You will need a responsible adult at least 59 years of age to accompany you and drive you home. This person must remain in the waiting room during your procedure. The hospital will cancel your procedure if you do not have a responsible adult with you.   1. Wear loose fitting clothing that is easily removed. 2. Leave jewelry and other valuables at home.  3. Remove all body piercing jewelry and leave at home. 4. Total time from  sign-in until discharge is approximately 2-3 hours. 5. You should go home directly after your procedure and rest. You can resume normal activities the day after your procedure. 6. The day of your procedure you should not:  Drive  Make legal decisions  Operate machinery  Drink alcohol  Return to work   You may call the office (Dept: [phone]) before 5:00pm, or page the doctor on call [phone]) after 5:00pm, for further instructions, if necessary.   Insurance Information YOU WILL NEED TO CHECK WITH YOUR INSURANCE COMPANY FOR THE BENEFITS OF COVERAGE YOU HAVE FOR THIS PROCEDURE.  UNFORTUNATELY, NOT ALL INSURANCE COMPANIES HAVE BENEFITS TO COVER ALL OR PART OF THESE TYPES OF PROCEDURES.  IT IS YOUR RESPONSIBILITY TO CHECK YOUR BENEFITS, HOWEVER, WE WILL BE GLAD TO ASSIST YOU WITH ANY CODES YOUR INSURANCE COMPANY MAY NEED.    PLEASE NOTE THAT MOST INSURANCE COMPANIES WILL NOT COVER A SCREENING COLONOSCOPY FOR PEOPLE UNDER THE AGE OF 50  IF YOU HAVE BCBS INSURANCE, YOU MAY HAVE BENEFITS FOR A SCREENING COLONOSCOPY BUT IF POLYPS ARE FOUND THE DIAGNOSIS WILL CHANGE AND THEN YOU MAY HAVE A DEDUCTIBLE THAT WILL NEED TO BE MET. SO PLEASE MAKE SURE YOU CHECK YOUR BENEFITS FOR A SCREENING COLONOSCOPY AS WELL AS A DIAGNOSTIC COLONOSCOPY.

## 2018-02-19 NOTE — Progress Notes (Signed)
Appropriate.

## 2018-02-20 ENCOUNTER — Telehealth: Payer: Self-pay | Admitting: General Practice

## 2018-02-20 NOTE — Telephone Encounter (Addendum)
I spoke with the patient and she stated that the Suprep was $100 and she would like to switch to a least expensive prep.  Okay to send in [place] and the patient is aware.

## 2018-02-26 MED ORDER — PEG 3350-KCL-NA BICARB-NACL 420 G PO SOLR: 4000.0000 mL | ORAL | 0 refills | Status: DC

## 2018-02-26 NOTE — Telephone Encounter (Signed)
trilyte has been sent in and new instructions have been mailed to the pt.

## 2018-02-26 NOTE — Addendum Note (Signed)
Addended by: Claudina Lick on: 02/26/2018 09:32 AM   Modules accepted: Orders

## 2018-03-04 ENCOUNTER — Other Ambulatory Visit (HOSPITAL_COMMUNITY): Payer: Self-pay | Admitting: Family Medicine

## 2018-03-04 ENCOUNTER — Telehealth: Payer: Self-pay | Admitting: Rheumatology

## 2018-03-04 DIAGNOSIS — Z1231 Encounter for screening mammogram for malignant neoplasm of breast: Secondary | ICD-10-CM

## 2018-03-04 NOTE — Telephone Encounter (Signed)
Patient called requesting prescription refill of Orencia to be called into [facility] at [phone].  Patient states she is going out of town next week and spoke with a representative from [facility] who told her they could get her medication to her this week with a verbal order.

## 2018-03-06 ENCOUNTER — Ambulatory Visit (HOSPITAL_COMMUNITY)
Admission: RE | Admit: 2018-03-06 | Discharge: 2018-03-06 | Disposition: A | Discharge status: Home / Self Care | Payer: Medicare Other | Source: Ambulatory Visit | Attending: Family Medicine | Admitting: Family Medicine

## 2018-03-06 DIAGNOSIS — Z1231 Encounter for screening mammogram for malignant neoplasm of breast: Secondary | ICD-10-CM | POA: Diagnosis not present

## 2018-03-06 NOTE — Telephone Encounter (Signed)
Last Visit: 09/18/17 Next visit: 03/31/18 Labs: 02/06/18 WNL TB Gold: 09/18/17 Neg   Okay to refill per Dr. Estanislado Pandy  Prescription called to the pharmacy.  Patient will be by the office to pick up prescription .

## 2018-03-17 NOTE — Progress Notes (Signed)
Office Visit Note  Patient: Samantha Yates             Date of Birth: 08-26-1961           MRN: [id number]             PCP: Sharilyn Sites, MD Referring: Sharilyn Sites, MD Visit Date: 03/31/2018 Occupation: @GUAROCC @    Subjective:  Right thumb and right elbow pain.   History of Present Illness: Samantha Yates is a 57 y.o. female with history of seropositive rheumatoid arthritis and osteoarthritis.  She has been on the Orencia subcu which has been controlling her symptoms fairly well.  She states she has some swelling in her right ankle joint which is better now.  She does have osteoarthritis involving multiple joints.  She is been experiencing some discomfort over the medial aspect of her right elbow.  She also has discomfort in her right thumb base. Activities of Daily Living:  Patient reports morning stiffness for 20-30 minutes.   Patient Reports nocturnal pain.  Difficulty dressing/grooming: Denies Difficulty climbing stairs: Reports Difficulty getting out of chair: Denies Difficulty using hands for taps, buttons, cutlery, and/or writing: Reports   Review of Systems  Constitutional: Negative for fatigue, night sweats, weight gain and weight loss.  HENT: Positive for mouth dryness. Negative for mouth sores, trouble swallowing, trouble swallowing and nose dryness.   Eyes: Negative for pain, redness, visual disturbance and dryness.  Respiratory: Negative for cough, shortness of breath and difficulty breathing.   Cardiovascular: Negative for chest pain, palpitations, hypertension, irregular heartbeat and swelling in legs/feet.  Gastrointestinal: Negative for abdominal pain, blood in stool, constipation and diarrhea.  Endocrine: Negative for increased urination.  Genitourinary: Negative for pelvic pain and vaginal dryness.  Musculoskeletal: Positive for arthralgias, joint pain and morning stiffness. Negative for joint swelling, myalgias, muscle weakness, muscle tenderness and  myalgias.  Skin: Negative for color change, rash, hair loss, skin tightness, ulcers and sensitivity to sunlight.  Allergic/Immunologic: Negative for susceptible to infections.  Neurological: Negative for dizziness, light-headedness, headaches, memory loss, night sweats and weakness.  Hematological: Negative for bruising/bleeding tendency and swollen glands.  Psychiatric/Behavioral: Negative for depressed mood, confusion and sleep disturbance. The patient is not nervous/anxious.     PMFS History:  Patient Active Problem List   Diagnosis Date Noted  . High risk medication use 11/08/2016  . Primary osteoarthritis of both hands 11/08/2016  . Primary osteoarthritis of both feet 11/08/2016  . History of gastroesophageal reflux (GERD) 11/08/2016  . History of hypertension 11/08/2016  . History of anxiety 11/08/2016  . History of allergic rhinitis 11/08/2016  . Former smoker 11/08/2016  . GERD (gastroesophageal reflux disease) 09/17/2008  . Rheumatoid arthritis ([place]) 09/17/2006  . Anxiety 09/17/2006  . Hypertension 09/17/2005  . Allergic rhinitis 09/17/1996    Past Medical History:  Diagnosis Date  . Arthritis   . Hypertension   . Rheumatoid arthritis ([place])     Family History  Problem Relation Age of Onset  . Diabetes Brother   . Diabetes Brother   . Hypertension Daughter   . Diabetes Daughter   . Healthy Son    Past Surgical History:  Procedure Laterality Date  . TONSILLECTOMY     Social History   Social History Narrative  . Not on file     Objective: Vital Signs: BP 106/77 (BP Location: Left Arm, Patient Position: Sitting, Cuff Size: Normal)   Pulse 92   Resp 14   Ht 5' 7.5" (1.715 m)  Wt 173 lb (78.5 kg)   BMI 26.70 kg/m    Physical Exam  Constitutional: She is oriented to person, place, and time. She appears well-developed and well-nourished.  HENT:  Head: Normocephalic and atraumatic.  Eyes: Conjunctivae and EOM are normal.  Neck: Normal range of motion.    Cardiovascular: Normal rate, regular rhythm, normal heart sounds and intact distal pulses.  Pulmonary/Chest: Effort normal and breath sounds normal.  Abdominal: Soft. Bowel sounds are normal.  Lymphadenopathy:    She has no cervical adenopathy.  Neurological: She is alert and oriented to person, place, and time.  Skin: Skin is warm and dry. Capillary refill takes less than 2 seconds.  Psychiatric: She has a normal mood and affect. Her behavior is normal.  Nursing note and vitals reviewed.    Musculoskeletal Exam: C-spine thoracic lumbar spine good range of motion.  Shoulder joints elbow joints wrist joint MCPs PIPs DIPs were in good range of motion.  She has some DIP PIP thickening in her hands and feet consistent with osteoarthritis.  She has tenderness on palpation of her right medial epicondyle area.  She had tenderness on palpation of bilateral trochanteric bursa.  Knee joints and ankles with good range of motion without synovitis.  CDAI Exam: CDAI Homunculus Exam:   Joint Counts:  CDAI Tender Joint count: 0 CDAI Swollen Joint count: 0  Global Assessments:  Patient Global Assessment: 2 Provider Global Assessment: 2  CDAI Calculated Score: 4    Investigation: No additional findings.TB Gold: 09/18/2017 Negative  CBC Latest Ref Rng & Units 02/06/2018 09/18/2017 07/08/2017  WBC 3.8 - 10.8 Thousand/uL 8.3 6.5 8.8  Hemoglobin 11.7 - 15.5 g/dL 12.1 12.2 12.6  Hematocrit 35.0 - 45.0 % 34.5(L) 36.5 36.1  Platelets 140 - 400 Thousand/uL 204 217 254   CMP Latest Ref Rng & Units 02/06/2018 09/18/2017 07/08/2017  Glucose 65 - 99 mg/dL 95 92 141(H)  BUN 7 - 25 mg/dL 15 15 13   Creatinine 0.50 - 1.05 mg/dL 0.83 0.91 1.07(H)  Sodium 135 - 146 mmol/L 137 139 141  Potassium 3.5 - 5.3 mmol/L 4.5 4.7 5.4(H)  Chloride 98 - 110 mmol/L 103 104 106  CO2 20 - 32 mmol/L 28 28 28   Calcium 8.6 - 10.4 mg/dL 9.5 9.2 9.7  Total Protein 6.1 - 8.1 g/dL 6.2 6.2 6.6  Total Bilirubin 0.2 - 1.2 mg/dL 0.3 0.5  0.2  Alkaline Phos 33 - 130 U/L - - -  AST 10 - 35 U/L 19 15 16   ALT 6 - 29 U/L 16 13 12     Imaging: Mm Digital Screening Bilateral  Result Date: 03/06/2018 CLINICAL DATA:  Screening. EXAM: DIGITAL SCREENING BILATERAL MAMMOGRAM WITH CAD COMPARISON:  Previous exam(s). ACR Breast Density Category b: There are scattered areas of fibroglandular density. FINDINGS: There are no findings suspicious for malignancy. Images were processed with CAD. IMPRESSION: No mammographic evidence of malignancy. A result letter of this screening mammogram will be mailed directly to the patient. RECOMMENDATION: Screening mammogram in one year. (Code:SM-B-01Y) BI-RADS CATEGORY  1: Negative. Electronically Signed   By: Lajean Manes M.D.   On: 03/06/2018 12:08    Speciality Comments: No specialty comments available.    Procedures:  No procedures performed Allergies: Sulfamethoxazole   Assessment / Plan:     Visit Diagnoses: Rheumatoid arthritis involving multiple sites with positive rheumatoid factor (HCC) - +RF,+anti-CCP with nodulosis.  Patient is clinically doing very well with no synovitis.  High risk medication use - Orencia SQ.  Her labs have been stable.  We will continue to monitor labs every 3 months.  Trochanteric bursitis of both hips-IT band stretching exercises were demonstrated and discussed in the office today handout was given.  Medial epicondylitis of right elbow-most likely related to mowing her lawn.  I have given her a handout on forearm exercises.  I offered Voltaren gel which she declined.  Primary osteoarthritis of both hands-joint protection muscle strengthening was discussed.  Primary osteoarthritis of both feet-proper fitting shoes were discussed  Other medical problems are listed as follows:  Other fatigue  History of allergic rhinitis  History of hypertension  History of anxiety  History of gastroesophageal reflux (GERD)  Former smoker  Association of heart disease  with rheumatoid arthritis was discussed. Need to monitor blood pressure, cholesterol, and to exercise 30-60 minutes on daily basis was discussed. Poor dental hygiene can be a predisposing factor for rheumatoid arthritis. Good dental hygiene was discussed.  Orders: No orders of the defined types were placed in this encounter.  No orders of the defined types were placed in this encounter.   Face-to-face time spent with patient was 30 minutes. Greater than 50% of time was spent in counseling and coordination of care.  Follow-Up Instructions: Return in about 5 months (around 08/31/2018) for Rheumatoid arthritis.   Bo Merino, MD  Note - This record has been created using Editor, commissioning.  Chart creation errors have been sought, but may not always  have been located. Such creation errors do not reflect on  the standard of medical care.

## 2018-03-28 DIAGNOSIS — Z0001 Encounter for general adult medical examination with abnormal findings: Secondary | ICD-10-CM | POA: Diagnosis not present

## 2018-03-28 DIAGNOSIS — E7849 Other hyperlipidemia: Secondary | ICD-10-CM | POA: Diagnosis not present

## 2018-03-28 DIAGNOSIS — J309 Allergic rhinitis, unspecified: Secondary | ICD-10-CM | POA: Diagnosis not present

## 2018-03-28 DIAGNOSIS — I10 Essential (primary) hypertension: Secondary | ICD-10-CM | POA: Diagnosis not present

## 2018-03-31 ENCOUNTER — Telehealth: Payer: Self-pay | Admitting: Gastroenterology

## 2018-03-31 ENCOUNTER — Ambulatory Visit: Payer: Medicare Other | Admitting: Rheumatology

## 2018-03-31 ENCOUNTER — Encounter: Payer: Self-pay | Admitting: Rheumatology

## 2018-03-31 VITALS — BP 106/77 | HR 92 | Resp 14 | Ht 67.5 in | Wt 173.0 lb

## 2018-03-31 DIAGNOSIS — M0579 Rheumatoid arthritis with rheumatoid factor of multiple sites without organ or systems involvement: Secondary | ICD-10-CM | POA: Diagnosis not present

## 2018-03-31 DIAGNOSIS — Z8709 Personal history of other diseases of the respiratory system: Secondary | ICD-10-CM

## 2018-03-31 DIAGNOSIS — Z8659 Personal history of other mental and behavioral disorders: Secondary | ICD-10-CM | POA: Diagnosis not present

## 2018-03-31 DIAGNOSIS — M19071 Primary osteoarthritis, right ankle and foot: Secondary | ICD-10-CM

## 2018-03-31 DIAGNOSIS — Z8679 Personal history of other diseases of the circulatory system: Secondary | ICD-10-CM

## 2018-03-31 DIAGNOSIS — R5383 Other fatigue: Secondary | ICD-10-CM

## 2018-03-31 DIAGNOSIS — Z87891 Personal history of nicotine dependence: Secondary | ICD-10-CM | POA: Diagnosis not present

## 2018-03-31 DIAGNOSIS — Z79899 Other long term (current) drug therapy: Secondary | ICD-10-CM

## 2018-03-31 DIAGNOSIS — M19072 Primary osteoarthritis, left ankle and foot: Secondary | ICD-10-CM

## 2018-03-31 DIAGNOSIS — M19041 Primary osteoarthritis, right hand: Secondary | ICD-10-CM

## 2018-03-31 DIAGNOSIS — M7701 Medial epicondylitis, right elbow: Secondary | ICD-10-CM | POA: Diagnosis not present

## 2018-03-31 DIAGNOSIS — Z8719 Personal history of other diseases of the digestive system: Secondary | ICD-10-CM

## 2018-03-31 DIAGNOSIS — M19042 Primary osteoarthritis, left hand: Secondary | ICD-10-CM

## 2018-03-31 DIAGNOSIS — M7061 Trochanteric bursitis, right hip: Secondary | ICD-10-CM

## 2018-03-31 DIAGNOSIS — M7062 Trochanteric bursitis, left hip: Secondary | ICD-10-CM

## 2018-03-31 MED ORDER — PEG 3350-KCL-NA BICARB-NACL 420 G PO SOLR: 4000.0000 mL | ORAL | 0 refills | Status: DC

## 2018-03-31 NOTE — Telephone Encounter (Signed)
rx has been sent in to [facility]. Pt is aware.

## 2018-03-31 NOTE — Patient Instructions (Addendum)
Elbow and Forearm Exercises Ask your health care provider which exercises are safe for you. Do exercises exactly as told by your health care provider and adjust them as directed. It is normal to feel mild stretching, pulling, tightness, or discomfort as you do these exercises, but you should stop right away if you feel sudden pain or your pain gets worse.Do not begin these exercises until told by your health care provider. RANGE OF MOTION EXERCISES These exercises warm up your muscles and joints and improve the movement and flexibility of your injured elbow and forearm. These exercises also help to relieve pain, numbness, and tingling.These exercises are done using the muscles in your injured elbow and forearm. Exercise A: Elbow Flexion, Active 1. Hold your left / right arm at your side, and bend your elbow as far as you can using your left / right arm muscles. 2. Hold this position for __________ seconds. 3. Slowly return to the starting position. Repeat __________ times. Complete this exercise __________ times a day. Exercise B: Elbow Extension, Active 1. Hold your left / right arm at your side, and straighten your elbow as much as you can using your left / right arm muscles. 2. Hold this position for __________ seconds. 3. Slowly return to the starting position. Repeat __________ times. Complete this exercise __________ times a day. Exercise C: Forearm Rotation, Supination, Active 1. Stand or sit with your elbows at your sides. 2. Bend your left / right elbow to an "L" shape (90 degrees). 3. Turn your palm upward until you feel a gentle stretch on the inside of your forearm. 4. Hold this position for __________ seconds. 5. Slowly release and return to the starting position. Repeat __________ times. Complete this exercise __________ times a day. Exercise D: Forearm Rotation, Pronation, Active 1. Stand or sit with your elbows at your side. 2. Bend your left / right elbow to an "L" shape (90  degrees). 3. Turn your left / right palm downward until you feel a gentle stretch on the top of your forearm. 4. Hold this position for __________ seconds. 5. Slowly release and return to the starting position. Repeat__________ times. Complete this exercise __________ times a day. STRETCHING EXERCISES These exercises warm up your muscles and joints and improve the movement and flexibility of your injured elbow and forearm. These exercises also help to relieve pain, numbness, and tingling.These exercises are done using your healthy elbow and forearm to help stretch the muscles in your injured elbow and forearm. Exercise E: Elbow Flexion, Active-Assisted  1. Hold your left / right arm at your side, and bend your elbow as much as you can using your left / right arm muscles. 2. Use your other hand to bend your left / right elbow farther. To do this, gently push up on your forearm until you feel a gentle stretch on the back of your elbow. 3. Hold this position for __________ seconds. 4. Slowly return to the starting position. Repeat __________ times. Complete this exercise __________ times a day. Exercise F: Elbow Extension, Active-Assisted  1. Hold your left / right arm at your side, and straighten your elbow as much as you can using your left / right arm muscles. 2. Use your other hand to straighten the left / right elbow farther. To do this, gently push down on your forearm until you feel a gentle stretch on the inside of your elbow. 3. Hold this position for __________ seconds. 4. Slowly return to the starting position. Repeat __________  times. Complete this exercise __________ times a day. Exercise G: Forearm Rotation, Supination, Active-Assisted  1. Sit with your left / right elbow bent in an "L" shape (90 degrees) with your forearm resting on a table. 2. Keeping your upper body and shoulder still, rotate your forearm so your left / right palm faces upward. 3. Use your other hand to help  rotate your forearm further until you feel a gentle to moderate stretch. 4. Hold this position for __________ seconds. 5. Slowly release the stretch and return to the starting position. Repeat __________ times. Complete this exercise __________ times a day. Exercise H: Forearm Rotation, Pronation, Active-Assisted  1. Sit with your left / right elbow bent in an "L" shape (90 degrees) with your forearm resting on a table. 2. Keeping your upper body and shoulder still, rotate your forearm so your palm faces the tabletop. 3. Use your other hand to help rotate your forearm further until you feel a gentle to moderate stretch. 4. Hold this position for __________ seconds. 5. Slowly release the stretch and return to the starting position. Repeat __________ times. Complete this exercise __________ times a day. Exercise I: Elbow Flexion, Supine, Passive 1. Lie on your back. 2. Extend your left / right arm up in the air, bracing it with your other hand. 3. Let your left / right your hand slowly lower toward your shoulder, while your elbow stays pointed toward the ceiling. You should feel a gentle stretch along the back of your upper arm and elbow. 4. If instructed by your health care provider, you may increase the intensity of your stretch by adding a small wrist weight or hand weight. 5. Hold this position for __________ seconds. 6. Slowly return to the starting position. Repeat __________ times. Complete this exercise __________ times a day. Exercise J: Elbow Extension, Supine, Passive  1. Lie on your back. Make sure that you are in a comfortable position that lets you relax your arm muscles. 2. Place a folded towel under your left / right upper arm so your elbow and shoulder are at the same height. Straighten your left / right arm so your elbow does not rest on the bed or towel. 3. Let the weight of your hand stretch your elbow. Keep your arm and chest muscles relaxed. You should feel a stretch on  the inside of your elbow. 4. If told by your health care provider, you may increase the intensity of your stretch by adding a small wrist weight or hand weight. 5. Hold this position for__________ seconds. 6. Slowly release the stretch. Repeat __________ times. Complete this exercise __________ times a day. STRENGTHENING EXERCISES These exercises build strength and endurance in your elbow and forearm. Endurance is the ability to use your muscles for a long time, even after they get tired. Exercise K: Elbow Flexion, Isometric  1. Stand or sit up straight. 2. Bend your left / right elbow in an "L" shape (90 degrees) and turn your palm up so your forearm is at the height of your waist. 3. Place your other hand on top of your forearm. Gently push down as your left / right arm resists. Push as hard as you can with both arms without causing any pain or movement at your left / right elbow. 4. Hold this position for __________ seconds. 5. Slowly release the tension in both arms. Let your muscles relax completely before repeating. Repeat __________ times. Complete this exercise __________ times a day. Exercise L: Elbow Extensors, Isometric  1. Stand or sit up straight. 2. Place your left / right arm so your palm faces your abdomen and it is at the height of your waist. 3. Place your other hand on the underside of your forearm. Gently push up as your left / right arm resists. Push as hard as you can with both arms, without causing any pain or movement at your left / right elbow. 4. Hold this position for __________ seconds. 5. Slowly release the tension in both arms. Let your muscles relax completely before repeating. Repeat __________ times. Complete this exercise __________ times a day. Exercise M: Elbow Flexion With Forearm Palm Up  1. Sit upright on a firm chair without armrests, or stand. 2. Place your left / right arm at your side with your palm facing forward. 3. Holding a __________weight  or gripping a rubber exercise band or tubing, bend your elbow to bring your hand toward your shoulder. 4. Hold this position for __________ seconds. 5. Slowly return to the starting position. Repeat __________times. Complete this exercise __________times a day. Exercise N: Elbow Extension  1. Sit on a firm chair without armrests, or stand. 2. Keeping your upper arms at your sides, bring both hands up toward your left / right shoulder while you grip a rubber exercise band or tubing. Your left / right hand should be just below the other hand. 3. Straighten your left / right elbow. 4. Hold this position for __________ seconds. 5. Control the resistance of the band or tubing as your hand returns to your side. Repeat __________times. Complete this exercise __________times a day. Exercise O: Forearm Rotation, Supination  1. Sit with your left / right forearm supported on a table. Keep your elbow at waist height. 2. Rest your hand over the edge of the table with your palm facing down. 3. Gently hold a lightweight hammer. 4. Without moving your elbow, slowly rotate your forearm to turn your palm and hand upward to a "thumbs-up" position. 5. Hold this position for __________ seconds. 6. Slowly return to the starting position. Repeat __________times. Complete this exercise __________times a day. Exercise P: Forearm Rotation, Pronation  1. Sit with your left / right forearm supported on a table. Keep your elbow below shoulder height. 2. Rest your hand over the edge of the table with your palm facing up. 3. Gently hold a lightweight hammer. 4. Without moving your elbow, slowly rotate your forearm to turn your palm and hand upward to a "thumbs-up" position. 5. Hold this position for __________seconds. 6. Slowly return to the starting position. Repeat __________times. Complete this exercise __________times a day. This information is not intended to replace advice given to you by your health care  provider. Make sure you discuss any questions you have with your health care provider. Document Released: 07/18/2005 Document Revised: 01/12/2016 Document Reviewed: 05/29/2015 Elsevier Interactive Patient Education  2018 [place] [facility] Ask your health care provider which exercises are safe for you. Do exercises exactly as told by your health care provider and adjust them as directed. It is normal to feel mild stretching, pulling, tightness, or discomfort as you do these exercises, but you should stop right away if you feel sudden pain or your pain gets worse.Do not begin these exercises until told by your health care provider. Stretching and range of motion exercises These exercises warm up your muscles and joints and improve the movement and flexibility of your hip and pelvis. Exercise A: Quadriceps, prone  1. Lie on  your abdomen on a firm surface, such as a bed or padded floor. 2. Bend your left / right knee and hold your ankle. If you cannot reach your ankle or pant leg, loop a belt around your foot and grab the belt instead. 3. Gently pull your heel toward your buttocks. Your knee should not slide out to the side. You should feel a stretch in the front of your thigh and knee. 4. Hold this position for __________ seconds. Repeat __________ times. Complete this stretch __________ times a day. Exercise B: Iliotibial band  1. Lie on your side with your left / right leg in the top position. 2. Bend both of your knees and grab your left / right ankle. Stretch out your bottom arm to help you balance. 3. Slowly bring your top knee back so your thigh goes behind your trunk. 4. Slowly lower your top leg toward the floor until you feel a gentle stretch on the outside of your left / right hip and thigh. If you do not feel a stretch and your knee will not fall farther, place the heel of your other foot on top of your knee and pull your knee down toward the floor with your  foot. 5. Hold this position for __________ seconds. Repeat __________ times. Complete this stretch __________ times a day. Strengthening exercises These exercises build strength and endurance in your hip and pelvis. Endurance is the ability to use your muscles for a long time, even after they get tired. Exercise C: Straight leg raises ( hip abductors) 1. Lie on your side with your left / right leg in the top position. Lie so your head, shoulder, knee, and hip line up. You may bend your bottom knee to help you balance. 2. Roll your hips slightly forward so your hips are stacked directly over each other and your left / right knee is facing forward. 3. Tense the muscles in your outer thigh and lift your top leg 4-6 inches (10-15 cm). 4. Hold this position for __________ seconds. 5. Slowly return to the starting position. Let your muscles relax completely before doing another repetition. Repeat __________ times. Complete this exercise __________ times a day. Exercise D: Straight leg raises ( hip extensors) 1. Lie on your abdomen on your bed or a firm surface. You can put a pillow under your hips if that is more comfortable. 2. Bend your left / right knee so your foot is straight up in the air. 3. Squeeze your buttock muscles and lift your left / right thigh off the bed. Do not let your back arch. 4. Tense this muscle as hard as you can without increasing any knee pain. 5. Hold this position for __________ seconds. 6. Slowly lower your leg to the starting position and allow it to relax completely. Repeat __________ times. Complete this exercise __________ times a day. Exercise E: Hip hike 1. Stand sideways on a bottom step. Stand on your left / right leg with your other foot unsupported next to the step. You can hold onto the railing or wall if needed for balance. 2. Keep your knees straight and your torso square. Then, lift your left / right hip up toward the ceiling. 3. Slowly let your left /  right hip lower toward the floor, past the starting position. Your foot should get closer to the floor. Do not lean or bend your knees. Repeat __________ times. Complete this exercise __________ times a day. This information is not intended to replace advice  given to you by your health care provider. Make sure you discuss any questions you have with your health care provider. Document Released: 09/03/2005 Document Revised: 05/08/2016 Document Reviewed: 08/05/2015 Elsevier Interactive Patient Education  2018 [place] We placed an order today for your standing lab work.    Please come back and get your standing labs in August and every 3 months  We have open lab Monday through Friday from 8:30-11:30 AM and 1:30-4:00 PM  at the office of Dr. Bo Merino.   You may experience shorter wait times on Monday and Friday afternoons. The office is located at [address] [place], [place], [place] [address] No appointment is necessary.   Labs are drawn by [facility].  You may receive a bill from [facility] for your lab work. If you have any questions regarding directions or hours of operation,  please call [phone].

## 2018-03-31 NOTE — Telephone Encounter (Signed)
The second prep rx needs to be called into [facility] because the other pharmacy has it on back order.

## 2018-04-09 ENCOUNTER — Other Ambulatory Visit: Payer: Self-pay

## 2018-04-09 ENCOUNTER — Encounter (HOSPITAL_COMMUNITY): Payer: Self-pay

## 2018-04-09 ENCOUNTER — Encounter (HOSPITAL_COMMUNITY)
Admission: RE | Disposition: A | Discharge status: Home / Self Care | Payer: Self-pay | Source: Ambulatory Visit | Attending: Gastroenterology

## 2018-04-09 ENCOUNTER — Ambulatory Visit (HOSPITAL_COMMUNITY)
Admission: RE | Admit: 2018-04-09 | Discharge: 2018-04-09 | Disposition: A | Discharge status: Home / Self Care | Payer: Medicare Other | Source: Ambulatory Visit | Attending: Gastroenterology | Admitting: Gastroenterology

## 2018-04-09 DIAGNOSIS — Z800 Family history of malignant neoplasm of digestive organs: Secondary | ICD-10-CM | POA: Diagnosis not present

## 2018-04-09 DIAGNOSIS — D128 Benign neoplasm of rectum: Secondary | ICD-10-CM

## 2018-04-09 DIAGNOSIS — Z79899 Other long term (current) drug therapy: Secondary | ICD-10-CM | POA: Insufficient documentation

## 2018-04-09 DIAGNOSIS — D120 Benign neoplasm of cecum: Secondary | ICD-10-CM | POA: Insufficient documentation

## 2018-04-09 DIAGNOSIS — Z87891 Personal history of nicotine dependence: Secondary | ICD-10-CM | POA: Insufficient documentation

## 2018-04-09 DIAGNOSIS — D123 Benign neoplasm of transverse colon: Secondary | ICD-10-CM | POA: Diagnosis not present

## 2018-04-09 DIAGNOSIS — D122 Benign neoplasm of ascending colon: Secondary | ICD-10-CM | POA: Diagnosis not present

## 2018-04-09 DIAGNOSIS — I10 Essential (primary) hypertension: Secondary | ICD-10-CM | POA: Insufficient documentation

## 2018-04-09 DIAGNOSIS — K648 Other hemorrhoids: Secondary | ICD-10-CM | POA: Insufficient documentation

## 2018-04-09 DIAGNOSIS — K621 Rectal polyp: Secondary | ICD-10-CM | POA: Diagnosis not present

## 2018-04-09 DIAGNOSIS — M069 Rheumatoid arthritis, unspecified: Secondary | ICD-10-CM | POA: Insufficient documentation

## 2018-04-09 DIAGNOSIS — D124 Benign neoplasm of descending colon: Secondary | ICD-10-CM | POA: Diagnosis not present

## 2018-04-09 DIAGNOSIS — Z1211 Encounter for screening for malignant neoplasm of colon: Secondary | ICD-10-CM

## 2018-04-09 DIAGNOSIS — Z8371 Family history of colonic polyps: Secondary | ICD-10-CM | POA: Insufficient documentation

## 2018-04-09 DIAGNOSIS — K5730 Diverticulosis of large intestine without perforation or abscess without bleeding: Secondary | ICD-10-CM | POA: Diagnosis not present

## 2018-04-09 HISTORY — PX: COLONOSCOPY: SHX5424

## 2018-04-09 HISTORY — PX: POLYPECTOMY: SHX5525

## 2018-04-09 SURGERY — COLONOSCOPY
Anesthesia: Moderate Sedation

## 2018-04-09 MED ORDER — MEPERIDINE HCL 100 MG/ML IJ SOLN
INTRAMUSCULAR | Status: DC | PRN
  Administered 2018-04-09 (×3): 25 mg via INTRAVENOUS

## 2018-04-09 MED ORDER — SODIUM CHLORIDE 0.9 % IV SOLN
INTRAVENOUS | Status: DC
  Administered 2018-04-09: 09:00:00 via INTRAVENOUS

## 2018-04-09 MED ORDER — STERILE WATER FOR IRRIGATION IR SOLN
Status: DC | PRN
  Administered 2018-04-09: 1.5 mL

## 2018-04-09 MED ORDER — MEPERIDINE HCL 100 MG/ML IJ SOLN
INTRAMUSCULAR | Status: AC
  Filled 2018-04-09: qty 2

## 2018-04-09 MED ORDER — MIDAZOLAM HCL 5 MG/5ML IJ SOLN
INTRAMUSCULAR | Status: AC
  Filled 2018-04-09: qty 10

## 2018-04-09 MED ORDER — MIDAZOLAM HCL 5 MG/5ML IJ SOLN
INTRAMUSCULAR | Status: DC | PRN
  Administered 2018-04-09: 2 mg via INTRAVENOUS
  Administered 2018-04-09: 1 mg via INTRAVENOUS
  Administered 2018-04-09: 2 mg via INTRAVENOUS

## 2018-04-09 NOTE — Op Note (Signed)
[facility] Patient Name: Samantha Yates Procedure Date: 04/09/2018 9:11 AM MRN: [id number] Date of Birth: 07-07-1961 Attending MD: Barney Drain MD, MD CSN: [id number] Age: 57 Admit Type: Outpatient Procedure:                Colonoscopy with COLD FORCEPS/SNARE POLYPECTOMY Indications:              Screening for colorectal malignant neoplasm Providers:                Barney Drain MD, MD, Lurline Del, RN, Aram Candela Referring MD:             Halford Chessman MD, MD Medicines:                Meperidine 75 mg IV, Midazolam 5 mg IV Complications:            No immediate complications. Estimated Blood Loss:     Estimated blood loss was minimal. Procedure:                Pre-Anesthesia Assessment:                           - Prior to the procedure, a History and Physical                            was performed, and patient medications and                            allergies were reviewed. The patient's tolerance of                            previous anesthesia was also reviewed. The risks                            and benefits of the procedure and the sedation                            options and risks were discussed with the patient.                            All questions were answered, and informed consent                            was obtained. Prior Anticoagulants: The patient has                            taken no previous anticoagulant or antiplatelet                            agents. ASA Grade Assessment: II - A patient with                            mild systemic disease. After reviewing the risks                            and benefits, the patient was deemed in  satisfactory condition to undergo the procedure.                            After obtaining informed consent, the colonoscope                            was passed under direct vision. Throughout the                            procedure, the patient's blood pressure, pulse, and                           oxygen saturations were monitored continuously. The                            CF-HQ190L ([id number]) scope was introduced through                            the anus and advanced to the the cecum, identified                            by appendiceal orifice and ileocecal valve. The                            colonoscopy was somewhat difficult due to a                            tortuous colon. Successful completion of the                            procedure was aided by straightening and shortening                            the scope to obtain bowel loop reduction and                            COLOWRAP. The patient tolerated the procedure                            fairly well. The quality of the bowel preparation                            was excellent. The ileocecal valve, appendiceal                            orifice, and rectum were photographed. Scope In: 9:48:03 AM Scope Out: 10:21:22 AM Scope Withdrawal Time: 0 hours 24 minutes 0 seconds  Total Procedure Duration: 0 hours 33 minutes 19 seconds  Findings:      Seven sessile polyps were found in the rectum, descending colon,       ascending colon and cecum. The polyps were 2 to 4 mm in size. These       polyps were removed with a cold biopsy forceps. Resection and retrieval       were complete.  Four sessile polyps were found in the rectum, descending colon,       transverse colon and cecum. The polyps were 4 to 7 mm in size. These       polyps were removed with a cold snare. Resection and retrieval were       complete.      A few small and large-mouthed diverticula were found in the       recto-sigmoid colon and sigmoid colon.      Internal hemorrhoids were found during retroflexion. The hemorrhoids       were small. Impression:               - Seven 2 to 4 mm polyps in the rectum, in the                            descending colon, in the ascending colon and in the                            cecum,  removed with a cold biopsy forceps. Resected                            and retrieved.                           - Four 4 to 7 mm polyps in the rectum, in the                            descending colon, in the transverse colon and in                            the cecum, removed with a cold snare. Resected and                            retrieved.                           - MILD Diverticulosis in the recto-sigmoid colon                            and in the sigmoid colon.                           - SMALL Internal hemorrhoids. Moderate Sedation:      Moderate (conscious) sedation was administered by the endoscopy nurse       and supervised by the endoscopist. The following parameters were       monitored: oxygen saturation, heart rate, blood pressure, and response       to care. Total physician intraservice time was 44 minutes. Recommendation:           - Repeat colonoscopy in 3 years for surveillance.                            ALL FIRST DEGREE RELATIVES NEED TCS AT AGE 57.                           -  High fiber diet.                           - Continue present medications.                           - Await pathology results.                           - Patient has a contact number available for                            emergencies. The signs and symptoms of potential                            delayed complications were discussed with the                            patient. Return to normal activities tomorrow.                            Written discharge instructions were provided to the                            patient. Procedure Code(s):        --- Professional ---                           [phone], Colonoscopy, flexible; with removal of                            tumor(s), polyp(s), or other lesion(s) by snare                            technique                           45380, 57, Colonoscopy, flexible; with biopsy,                            single or multiple                            G0500, Moderate sedation services provided by the                            same physician or other qualified health care                            professional performing a gastrointestinal                            endoscopic service that sedation supports,                            requiring the presence of an independent trained  observer to assist in the monitoring of the                            patient's level of consciousness and physiological                            status; initial 15 minutes of intra-service time;                            patient age 59 years or older (additional time may                            be reported with [phone], as appropriate)                           [id number], Moderate sedation services provided by the                            same physician or other qualified health care                            professional performing the diagnostic or                            therapeutic service that the sedation supports,                            requiring the presence of an independent trained                            observer to assist in the monitoring of the                            patient's level of consciousness and physiological                            status; each additional 15 minutes intraservice                            time (List separately in addition to code for                            primary service)                           [phone], Moderate sedation services provided by the                            same physician or other qualified health care                            professional performing the diagnostic or  therapeutic service that the sedation supports,                            requiring the presence of an independent trained                            observer to assist in the monitoring of the                            patient's level of  consciousness and physiological                            status; each additional 15 minutes intraservice                            time (List separately in addition to code for                            primary service) Diagnosis Code(s):        --- Professional ---                           Z12.11, Encounter for screening for malignant                            neoplasm of colon                           D12.2, Benign neoplasm of ascending colon                           K62.1, Rectal polyp                           D12.4, Benign neoplasm of descending colon                           D12.3, Benign neoplasm of transverse colon (hepatic                            flexure or splenic flexure)                           D12.0, Benign neoplasm of cecum                           K64.8, Other hemorrhoids                           K57.30, Diverticulosis of large intestine without                            perforation or abscess without bleeding CPT copyright 2017 American [employer] Association. All rights reserved. The codes documented in this report are preliminary and upon coder review may  be revised to meet current compliance requirements. Barney Drain, MD Barney Drain MD, MD 04/09/2018 10:38:43  AM This report has been signed electronically. Number of Addenda: 0

## 2018-04-09 NOTE — H&P (Signed)
Primary Care Physician:  Golding, John, MD Primary Gastroenterologist:  Dr.   Pre-Procedure History & Physical: HPI:  Samantha Yates is a 56 y.o. female here for COLON CANCER SCREENING.  Past Medical History:  Diagnosis Date  . Arthritis   . Hypertension   . Rheumatoid arthritis (HCC)     Past Surgical History:  Procedure Laterality Date  . TONSILLECTOMY      Prior to Admission medications   Medication Sig Start Date End Date Taking? Authorizing Provider  Abatacept (ORENCIA CLICKJECT) 125 MG/ML SOAJ Inject 125 mg into the skin once a week. 08/22/17  Yes Deveshwar, Shaili, MD  acetaminophen (TYLENOL 8 HOUR ARTHRITIS PAIN) 650 MG CR tablet Take 650 mg by mouth every 8 (eight) hours as needed for pain.   Yes [provider]  citalopram (CELEXA) 40 MG tablet Take 1 tablet (40 mg total) by mouth at bedtime. 06/20/12  Yes Hensel, William A, MD  enalapril (VASOTEC) 10 MG tablet Take 10 mg by mouth daily.   Yes [provider]  flintstones complete (FLINTSTONES) 60 MG chewable tablet Chew 1 tablet by mouth daily with breakfast.   Yes [provider]  fluticasone (FLONASE) 50 MCG/ACT nasal spray Place 2 sprays into both nostrils daily.    Yes [provider]  folic acid (FOLVITE) 1 MG tablet Take 1 mg by mouth daily.   Yes [provider]  loratadine (CLARITIN) 10 MG tablet Take 10 mg by mouth at bedtime.    Yes [provider]  montelukast (SINGULAIR) 10 MG tablet Take 10 mg by mouth at bedtime.   Yes [provider]  pantoprazole (PROTONIX) 40 MG tablet Take 40 mg by mouth daily.   Yes [provider]  polyethylene glycol-electrolytes (TRILYTE) 420 g solution Take 4,000 mLs by mouth as directed. Patient taking differently: Take 4,000 mLs by mouth once.  03/31/18  Yes Boone, Anna W, NP  vitamin B-12 (CYANOCOBALAMIN) 500 MCG tablet Take 500 mcg by mouth daily.   Yes [provider]  vitamin B-6  (PYRIDOXINE) 25 MG tablet Take 25 mg by mouth daily.   Yes [provider]  Na Sulfate-K Sulfate-Mg Sulf (SUPREP BOWEL PREP KIT) 17.5-3.13-1.6 GM/177ML SOLN Take 1 kit by mouth as directed. Patient not taking: Reported on 03/31/2018 02/18/18   Boone, Anna W, NP  polyethylene glycol-electrolytes (TRILYTE) 420 g solution Take 4,000 mLs by mouth as directed. Patient not taking: Reported on 03/31/2018 02/26/18   Boone, Anna W, NP    Allergies as of 02/18/2018 - Review Complete 02/18/2018  Allergen Reaction Noted  . Sulfamethoxazole  02/18/2018    Family History  Problem Relation Age of Onset  . Colon polyps Mother   . Diabetes Brother   . Diabetes Brother   . Hypertension Daughter   . Diabetes Daughter   . Healthy Son   . Colon cancer Cousin     Social History   Socioeconomic History  . Marital status: Single    Spouse name: Not on file  . Number of children: Not on file  . Years of education: Not on file  . Highest education level: Not on file  Occupational History  . Not on file  Social Needs  . Financial resource strain: Not on file  . Food insecurity:    Worry: Not on file    Inability: Not on file  . Transportation needs:    Medical: Not on file    Non-medical: Not on file  Tobacco Use  .   Smoking status: Former Smoker    Packs/day: 0.20    Years: 12.00    Pack years: 2.40    Types: Cigarettes    Start date: 09/17/1986    Last attempt to quit: 09/17/1998    Years since quitting: 19.5  . Smokeless tobacco: Never Used  Substance and Sexual Activity  . Alcohol use: Yes    Comment: rarely  . Drug use: No  . Sexual activity: Not on file  Lifestyle  . Physical activity:    Days per week: Not on file    Minutes per session: Not on file  . Stress: Not on file  Relationships  . Social connections:    Talks on phone: Not on file    Gets together: Not on file    Attends religious service: Not on file    Active member of club or organization: Not on file     Attends meetings of clubs or organizations: Not on file    Relationship status: Not on file  . Intimate partner violence:    Fear of current or ex partner: Not on file    Emotionally abused: Not on file    Physically abused: Not on file    Forced sexual activity: Not on file  Other Topics Concern  . 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    Review of Systems: See HPI, otherwise negative ROS   Physical Exam: BP 119/65   Pulse 82   Temp 98.1 F (36.7 C) (Oral)   SpO2 100%  General:   Alert,  pleasant and cooperative in NAD Head:  Normocephalic and atraumatic. Neck:  Supple; Lungs:  Clear throughout to auscultation.    Heart:  Regular rate and rhythm. Abdomen:  Soft, nontender and nondistended. Normal bowel sounds, without guarding, and without rebound.   Neurologic:  Alert and  oriented x4;  grossly normal neurologically.  Impression/Plan:    SCREENING  Plan:  1. TCS TODAY DISCUSSED PROCEDURE, BENEFITS, & RISKS: < 1% chance of medication reaction, bleeding, perforation, or rupture of spleen/liver.  

## 2018-04-09 NOTE — Discharge Instructions (Signed)
You have small internal hemorrhoids and diverticulosis IN YOUR LEFT COLON. YOU HAD ELEVEN POLYPS REMOVED.    DRINK WATER TO KEEP YOUR URINE LIGHT YELLOW.  FOLLOW A HIGH FIBER DIET. AVOID ITEMS THAT CAUSE BLOATING. See info below.  YOUR BIOPSY RESULTS WILL BE AVAILABLE IN 7 DAYS.   USE PREPARATION H FOUR TIMES  A DAY IF NEEDED TO RELIEVE RECTAL PAIN/PRESSURE/BLEEDING.  Next colonoscopy in 3 years. YOUR SISTERS, BROTHERS, CHILDREN, AND PARENTS NEED TO HAVE A COLONOSCOPY STARTING AT THE AGE OF 40.    Colonoscopy Care After Read the instructions outlined below and refer to this sheet in the next week. These discharge instructions provide you with general information on caring for yourself after you leave the hospital. While your treatment has been planned according to the most current medical practices available, unavoidable complications occasionally occur. If you have any problems or questions after discharge, call DR. Brandun Pinn, [phone].  ACTIVITY  You may resume your regular activity, but move at a slower pace for the next 24 hours.   Take frequent rest periods for the next 24 hours.   Walking will help get rid of the air and reduce the bloated feeling in your belly (abdomen).   No driving for 24 hours (because of the medicine (anesthesia) used during the test).   You may shower.   Do not sign any important legal documents or operate any machinery for 24 hours (because of the anesthesia used during the test).    NUTRITION  Drink plenty of fluids.   You may resume your normal diet as instructed by your doctor.   Begin with a light meal and progress to your normal diet. Heavy or fried foods are harder to digest and may make you feel sick to your stomach (nauseated).   Avoid alcoholic beverages for 24 hours or as instructed.    MEDICATIONS  You may resume your normal medications.   WHAT YOU CAN EXPECT TODAY  Some feelings of bloating in the abdomen.   Passage of  more gas than usual.   Spotting of blood in your stool or on the toilet paper  .  IF YOU HAD POLYPS REMOVED DURING THE COLONOSCOPY:  Eat a soft diet IF YOU HAVE NAUSEA, BLOATING, ABDOMINAL PAIN, OR VOMITING.    FINDING OUT THE RESULTS OF YOUR TEST Not all test results are available during your visit. DR. Oneida Alar WILL CALL YOU WITHIN 14 DAYS OF YOUR PROCEDUE WITH YOUR RESULTS. Do not assume everything is normal if you have not heard from DR. Shakala Marlatt, CALL HER OFFICE AT [phone].  SEEK IMMEDIATE MEDICAL ATTENTION AND CALL THE OFFICE: [phone] IF:  You have more than a spotting of blood in your stool.   Your belly is swollen (abdominal distention).   You are nauseated or vomiting.   You have a temperature over 101F.   You have abdominal pain or discomfort that is severe or gets worse throughout the day.  High-Fiber Diet A high-fiber diet changes your normal diet to include more whole grains, legumes, fruits, and vegetables. Changes in the diet involve replacing refined carbohydrates with unrefined foods. The calorie level of the diet is essentially unchanged. The Dietary Reference Intake (recommended amount) for adult males is 38 grams per day. For adult females, it is 25 grams per day. Pregnant and lactating women should consume 28 grams of fiber per day. Fiber is the intact part of a plant that is not broken down during digestion. Functional fiber is fiber that has  been isolated from the plant to provide a beneficial effect in the body. PURPOSE  Increase stool bulk.   Ease and regulate bowel movements.   Lower cholesterol.   REDUCE RISK OF COLON CANCER  INDICATIONS THAT YOU NEED MORE FIBER  Constipation and hemorrhoids.   Uncomplicated diverticulosis (intestine condition) and irritable bowel syndrome.   Weight management.   As a protective measure against hardening of the arteries (atherosclerosis), diabetes, and cancer.   GUIDELINES FOR INCREASING FIBER IN THE  DIET  Start adding fiber to the diet slowly. A gradual increase of about 5 more grams (2 slices of whole-wheat bread, 2 servings of most fruits or vegetables, or 1 bowl of high-fiber cereal) per day is best. Too rapid an increase in fiber may result in constipation, flatulence, and bloating.   Drink enough water and fluids to keep your urine clear or pale yellow. Water, juice, or caffeine-free drinks are recommended. Not drinking enough fluid may cause constipation.   Eat a variety of high-fiber foods rather than one type of fiber.   Try to increase your intake of fiber through using high-fiber foods rather than fiber pills or supplements that contain small amounts of fiber.   The goal is to change the types of food eaten. Do not supplement your present diet with high-fiber foods, but replace foods in your present diet.   INCLUDE A VARIETY OF FIBER SOURCES  Replace refined and processed grains with whole grains, canned fruits with fresh fruits, and incorporate other fiber sources. White rice, white breads, and most bakery goods contain little or no fiber.   Brown whole-grain rice, buckwheat oats, and many fruits and vegetables are all good sources of fiber. These include: broccoli, Brussels sprouts, cabbage, cauliflower, beets, sweet potatoes, white potatoes (skin on), carrots, tomatoes, eggplant, squash, berries, fresh fruits, and dried fruits.   Cereals appear to be the richest source of fiber. Cereal fiber is found in whole grains and bran. Bran is the fiber-rich outer coat of cereal grain, which is largely removed in refining. In whole-grain cereals, the bran remains. In breakfast cereals, the largest amount of fiber is found in those with "bran" in their names. The fiber content is sometimes indicated on the label.   You may need to include additional fruits and vegetables each day.   In baking, for 1 cup white flour, you may use the following substitutions:   1 cup whole-wheat flour  minus 2 tablespoons.   1/2 cup white flour plus 1/2 cup whole-wheat flour.   Polyps, Colon  A polyp is extra tissue that grows inside your body. Colon polyps grow in the large intestine. The large intestine, also called the colon, is part of your digestive system. It is a long, hollow tube at the end of your digestive tract where your body makes and stores stool. Most polyps are not dangerous. They are benign. This means they are not cancerous. But over time, some types of polyps can turn into cancer. Polyps that are smaller than a pea are usually not harmful. But larger polyps could someday become or may already be cancerous. To be safe, doctors remove all polyps and test them.   PREVENTION There is not one sure way to prevent polyps. You might be able to lower your risk of getting them if you:  Eat more fruits and vegetables and less fatty food.   Do not smoke.   Avoid alcohol.   Exercise every day.   Lose weight if you are overweight.  Eating more calcium and folate can also lower your risk of getting polyps. Some foods that are rich in calcium are milk, cheese, and broccoli. Some foods that are rich in folate are chickpeas, kidney beans, and spinach.    Diverticulosis Diverticulosis is a common condition that develops when small pouches (diverticula) form in the wall of the colon. The risk of diverticulosis increases with age. It happens more often in people who eat a low-fiber diet. Most individuals with diverticulosis have no symptoms. Those individuals with symptoms usually experience belly (abdominal) pain, constipation, or loose stools (diarrhea).  HOME CARE INSTRUCTIONS  Increase the amount of fiber in your diet as directed by your caregiver or dietician. This may reduce symptoms of diverticulosis.   Drink at least 6 to 8 glasses of water each day to prevent constipation.   Try not to strain when you have a bowel movement.   Avoiding nuts and seeds to prevent  complications is NOT NECESSARY.   FOODS HAVING HIGH FIBER CONTENT INCLUDE:  Fruits. Apple, peach, pear, tangerine, raisins, prunes.   Vegetables. Brussels sprouts, asparagus, broccoli, cabbage, carrot, cauliflower, romaine lettuce, spinach, summer squash, tomato, winter squash, zucchini.   Starchy Vegetables. Baked beans, kidney beans, lima beans, split peas, lentils, potatoes (with skin).   Grains. Whole wheat bread, brown rice, bran flake cereal, plain oatmeal, white rice, shredded wheat, bran muffins.   SEEK IMMEDIATE MEDICAL CARE IF:  You develop increasing pain or severe bloating.   You have an oral temperature above 101F.   You develop vomiting or bowel movements that are bloody or black.

## 2018-04-11 ENCOUNTER — Telehealth: Payer: Self-pay | Admitting: Gastroenterology

## 2018-04-11 NOTE — Telephone Encounter (Signed)
PLEASE CALL PT. SHE HAD MORE THAN THREE SIMPLE AND SERRATED ADENOMAS REMOVED.    DRINK WATER TO KEEP YOUR URINE LIGHT YELLOW.  CONTINUE YOUR WEIGHT LOSS EFFORTS.  FOLLOW A HIGH FIBER DIET. AVOID ITEMS THAT CAUSE BLOATING.   USE PREPARATION H FOUR TIMES  A DAY IF NEEDED TO RELIEVE RECTAL PAIN/PRESSURE/BLEEDING.  Next colonoscopy in 3 years. YOUR SISTERS, BROTHERS, CHILDREN, AND PARENTS NEED TO HAVE A COLONOSCOPY STARTING AT THE AGE OF 40.

## 2018-04-14 ENCOUNTER — Encounter (HOSPITAL_COMMUNITY): Payer: Self-pay | Admitting: Gastroenterology

## 2018-04-14 NOTE — Telephone Encounter (Signed)
ON RECALL  °

## 2018-04-14 NOTE — Telephone Encounter (Signed)
Pt is aware of results. 

## 2018-04-14 NOTE — Telephone Encounter (Signed)
I called pt, bad connection.

## 2018-04-18 DIAGNOSIS — E782 Mixed hyperlipidemia: Secondary | ICD-10-CM | POA: Diagnosis not present

## 2018-04-18 DIAGNOSIS — Z0000 Encounter for general adult medical examination without abnormal findings: Secondary | ICD-10-CM | POA: Diagnosis not present

## 2018-05-07 ENCOUNTER — Other Ambulatory Visit: Payer: Self-pay

## 2018-05-07 DIAGNOSIS — Z79899 Other long term (current) drug therapy: Secondary | ICD-10-CM

## 2018-05-08 LAB — COMPLETE METABOLIC PANEL WITH GFR
AG Ratio: 1.6 (calc) (ref 1.0–2.5)
ALKALINE PHOSPHATASE (APISO): 56 U/L (ref 33–130)
ALT: 12 U/L (ref 6–29)
AST: 15 U/L (ref 10–35)
Albumin: 3.9 g/dL (ref 3.6–5.1)
BILIRUBIN TOTAL: 0.2 mg/dL (ref 0.2–1.2)
BUN: 15 mg/dL (ref 7–25)
CHLORIDE: 103 mmol/L (ref 98–110)
CO2: 26 mmol/L (ref 20–32)
Calcium: 9.2 mg/dL (ref 8.6–10.4)
Creat: 0.97 mg/dL (ref 0.50–1.05)
GFR, Est African American: 75 mL/min/{1.73_m2} (ref >=60)
GFR, Est Non African American: 64 mL/min/{1.73_m2} (ref >=60)
GLUCOSE: 88 mg/dL (ref 65–99)
Globulin: 2.4 g/dL (calc) (ref 1.9–3.7)
Potassium: 4.4 mmol/L (ref 3.5–5.3)
Sodium: 137 mmol/L (ref 135–146)
Total Protein: 6.3 g/dL (ref 6.1–8.1)

## 2018-05-08 LAB — CBC WITH DIFFERENTIAL/PLATELET
BASOS ABS: 39 {cells}/uL (ref 0–200)
Basophils Relative: 0.4 %
EOS PCT: 1.9 %
Eosinophils Absolute: 184 cells/uL (ref 15–500)
HEMATOCRIT: 34.6 % — AB (ref 35.0–45.0)
HEMOGLOBIN: 11.8 g/dL (ref 11.7–15.5)
LYMPHS ABS: 3773 {cells}/uL (ref 850–3900)
MCH: 30.9 pg (ref 27.0–33.0)
MCHC: 34.1 g/dL (ref 32.0–36.0)
MCV: 90.6 fL (ref 80.0–100.0)
MPV: 11.8 fL (ref 7.5–12.5)
Monocytes Relative: 7.7 %
NEUTROS ABS: 4957 {cells}/uL (ref 1500–7800)
NEUTROS PCT: 51.1 %
Platelets: 220 10*3/uL (ref 140–400)
RBC: 3.82 10*6/uL (ref 3.80–5.10)
RDW: 12.7 % (ref 11.0–15.0)
Total Lymphocyte: 38.9 %
WBC mixed population: 747 cells/uL (ref 200–950)
WBC: 9.7 10*3/uL (ref 3.8–10.8)

## 2018-06-06 ENCOUNTER — Telehealth: Payer: Self-pay | Admitting: Rheumatology

## 2018-06-06 NOTE — Telephone Encounter (Signed)
Last Visit: 03/31/18 Next visit: 09/04/18 Labs: 05/07/18 WNL Tb Gold: 09/18/17 Neg   Okay to refill per Dr. Estanislado Pandy  Faxed prescription to [facility]

## 2018-06-06 NOTE — Telephone Encounter (Signed)
Patient request a refill on Orencia [facility]. Please call patient with questions.

## 2018-06-11 ENCOUNTER — Telehealth: Payer: Self-pay | Admitting: Rheumatology

## 2018-06-11 NOTE — Telephone Encounter (Signed)
Patient called stating [facility] told her that they have not received the prescription refill of her Orencia.  Patient is requesting that another prescription be sent in.

## 2018-06-12 NOTE — Telephone Encounter (Signed)
Patient advised prescription was faxed on the 06/06/18. Patient advised I contacted [facility] and spoke with [place]. Dawn stated that they received thousands of prescriptions a day and have not received hers. I was provided with her supervisor fax number to re fax the prescription. Re faxed prescription and advised patient.

## 2018-06-19 ENCOUNTER — Telehealth: Payer: Self-pay | Admitting: Rheumatology

## 2018-06-19 NOTE — Telephone Encounter (Signed)
Spoke with patient and advised her that I would re faxed the prescription again to [facility]. Advised patient she may come to the office to get a sample tomorrow. Patient advised that we would contact the patient assistance company about the trouble we have been having.

## 2018-06-19 NOTE — Telephone Encounter (Signed)
Patient left a voicemail stating she talked to [employer] today and they are requesting that the prescription be faxed to this number by the end of the day today so they can send the medication tomorrow.  Fax [phone]

## 2018-06-20 ENCOUNTER — Telehealth: Payer: Self-pay | Admitting: Rheumatology

## 2018-06-20 ENCOUNTER — Other Ambulatory Visit: Payer: Self-pay | Admitting: Pharmacist

## 2018-06-20 DIAGNOSIS — M0579 Rheumatoid arthritis with rheumatoid factor of multiple sites without organ or systems involvement: Secondary | ICD-10-CM

## 2018-06-20 MED ORDER — PREDNISONE 5 MG PO TABS: ORAL_TABLET | ORAL | 0 refills | Status: DC

## 2018-06-20 NOTE — Telephone Encounter (Signed)
Patient came to pick up Orencia sq sample in the office today.  She requested a prednisone taper for right wrist joint swelling.  I briefly saw her and noted warmth swelling and tenderness over the right wrist.  She was given a prednisone taper starting at 20 mg and tapering by 5 mg every 2 days. Bo Merino, MD

## 2018-06-20 NOTE — Progress Notes (Signed)
Patient presented to the office to pick up an [place] sample.  She has had problems getting the medication from the patient assistance program.    Patient complained about pain in her right hand and wrist.  Requested prednisone taper.  Stated that she was unable to hold her toothbrush or pressure here.  Dr. Estanislado Pandy examined her hand and found minimal swelling and redness.  Prednisone taper okayed by Dr. Estanislado Pandy.  Prescription for prednisone taper sent to local pharmacy.  Sample Medication: [name] Lot # [id number] Expiration: 03/2019

## 2018-06-26 ENCOUNTER — Telehealth: Payer: Self-pay | Admitting: Rheumatology

## 2018-06-26 ENCOUNTER — Other Ambulatory Visit: Payer: Self-pay | Admitting: *Deleted

## 2018-06-26 MED ORDER — ABATACEPT 125 MG/ML ~~LOC~~ SOAJ: 125.0000 mg | SUBCUTANEOUS | 2 refills | Status: DC

## 2018-06-26 NOTE — Telephone Encounter (Signed)
Patient called stating she is still has not received he Orencia.  Patient states she called them on Monday and they said they were processing it and today they say they never got the prescription.  The # to call with a verbal order is [phone] Valentino Hue number is [phone]

## 2018-06-26 NOTE — Telephone Encounter (Signed)
Contacted patient to check in and see if she has received her Orencia from the patient assistance program. Patient states she has not. Patient states she call earlier this week and was advised it was processing. Patient states she then called back and was advised the prescription has not been received. The prescription has been faxed numerous times over the last several weeks. Patient was given a number which she provided to me [phone] and was advised we may provide a verbal authorization. Spoke with [facility] the pharmacist and provided verbal prescription.

## 2018-06-26 NOTE — Telephone Encounter (Signed)
Patient advised that a verbal prescription was called to the pharmacy today at 1:34 pm. Patient advised that if [facility] is not able to get her prescription out to her in time for her to have for her injection tomorrow she may come by the office to get a sample. Patient verbalized understanding.

## 2018-07-01 DIAGNOSIS — Z23 Encounter for immunization: Secondary | ICD-10-CM | POA: Diagnosis not present

## 2018-07-08 DIAGNOSIS — Z1389 Encounter for screening for other disorder: Secondary | ICD-10-CM | POA: Diagnosis not present

## 2018-07-08 DIAGNOSIS — N342 Other urethritis: Secondary | ICD-10-CM | POA: Diagnosis not present

## 2018-07-21 ENCOUNTER — Telehealth: Payer: Self-pay | Admitting: Pharmacy Technician

## 2018-07-21 NOTE — Telephone Encounter (Signed)
Called patient to discuss BMS renewal application for 5800. Application was mailed out by foundation on Friday. Patient will complete application and will call office with any questions.  11:29 AM Beatriz Chancellor, CPhT

## 2018-08-11 ENCOUNTER — Other Ambulatory Visit: Payer: Self-pay

## 2018-08-11 DIAGNOSIS — Z79899 Other long term (current) drug therapy: Secondary | ICD-10-CM

## 2018-08-11 LAB — COMPLETE METABOLIC PANEL WITH GFR
AG Ratio: 1.9 (calc) (ref 1.0–2.5)
ALKALINE PHOSPHATASE (APISO): 57 U/L (ref 33–130)
ALT: 13 U/L (ref 6–29)
AST: 16 U/L (ref 10–35)
Albumin: 3.9 g/dL (ref 3.6–5.1)
BUN: 18 mg/dL (ref 7–25)
CALCIUM: 9.1 mg/dL (ref 8.6–10.4)
CO2: 28 mmol/L (ref 20–32)
CREATININE: 0.98 mg/dL (ref 0.50–1.05)
Chloride: 100 mmol/L (ref 98–110)
GFR, EST NON AFRICAN AMERICAN: 63 mL/min/{1.73_m2} (ref >=60)
GFR, Est African American: 73 mL/min/{1.73_m2} (ref >=60)
GLOBULIN: 2.1 g/dL (ref 1.9–3.7)
GLUCOSE: 133 mg/dL — AB (ref 65–99)
Potassium: 4.3 mmol/L (ref 3.5–5.3)
SODIUM: 135 mmol/L (ref 135–146)
Total Bilirubin: 0.3 mg/dL (ref 0.2–1.2)
Total Protein: 6 g/dL — ABNORMAL LOW (ref 6.1–8.1)

## 2018-08-11 LAB — CBC WITH DIFFERENTIAL/PLATELET
BASOS PCT: 0.7 %
Basophils Absolute: 54 cells/uL (ref 0–200)
EOS ABS: 131 {cells}/uL (ref 15–500)
Eosinophils Relative: 1.7 %
HEMATOCRIT: 35.2 % (ref 35.0–45.0)
HEMOGLOBIN: 12.1 g/dL (ref 11.7–15.5)
LYMPHS ABS: 3373 {cells}/uL (ref 850–3900)
MCH: 31.5 pg (ref 27.0–33.0)
MCHC: 34.4 g/dL (ref 32.0–36.0)
MCV: 91.7 fL (ref 80.0–100.0)
MPV: 11.8 fL (ref 7.5–12.5)
Monocytes Relative: 6.5 %
NEUTROS ABS: 3642 {cells}/uL (ref 1500–7800)
Neutrophils Relative %: 47.3 %
PLATELETS: 203 10*3/uL (ref 140–400)
RBC: 3.84 10*6/uL (ref 3.80–5.10)
RDW: 12.3 % (ref 11.0–15.0)
Total Lymphocyte: 43.8 %
WBC: 7.7 10*3/uL (ref 3.8–10.8)
WBCMIX: 501 {cells}/uL (ref 200–950)

## 2018-08-11 NOTE — Telephone Encounter (Signed)
Spoke to patient, she will bring completed application to office today.

## 2018-08-12 NOTE — Telephone Encounter (Signed)
Patient brought by completed application. Will fax once Md signature has been received.  8:40 AM Beatriz Chancellor, CPhT

## 2018-08-22 NOTE — Progress Notes (Deleted)
Office Visit Note  Patient: Samantha Yates             Date of Birth: 01/20/1961           MRN: [id number]             PCP: Sharilyn Sites, MD Referring: Sharilyn Sites, MD Visit Date: 09/04/2018 Occupation: @GUAROCC @  Subjective:  No chief complaint on file.  Orencia 125 mg weekly.  Given prednisone taper on 06/20/2018.  Last TB gold negative on 09/18/2017.  Most recent CBC/CMP within normal limits on 08/11/2018.  Next CBC/CMP due in February and then every 3 months.  Standing orders are in place. Recommend flu, Pneumovax 23, Prevnar 13, and Shingrix as indicated.  X-ray of bilateral hands, feet, knees taken on 09/18/2017.  History of Present Illness: Samantha Yates is a 57 y.o. female with history of seropositive rheumatoid arthritis and osteoarthritis.  Activities of Daily Living:  Patient reports morning stiffness for *** {minute/hour:19697}.   Patient {ACTIONS;DENIES/REPORTS:21021675::"Denies"} nocturnal pain.  Difficulty dressing/grooming: {ACTIONS;DENIES/REPORTS:21021675::"Denies"} Difficulty climbing stairs: {ACTIONS;DENIES/REPORTS:21021675::"Denies"} Difficulty getting out of chair: {ACTIONS;DENIES/REPORTS:21021675::"Denies"} Difficulty using hands for taps, buttons, cutlery, and/or writing: {ACTIONS;DENIES/REPORTS:21021675::"Denies"}  No Rheumatology ROS completed.   PMFS History:  Patient Active Problem List   Diagnosis Date Noted  . Special screening for malignant neoplasms, colon   . High risk medication use 11/08/2016  . Primary osteoarthritis of both hands 11/08/2016  . Primary osteoarthritis of both feet 11/08/2016  . History of gastroesophageal reflux (GERD) 11/08/2016  . History of hypertension 11/08/2016  . History of anxiety 11/08/2016  . History of allergic rhinitis 11/08/2016  . Former smoker 11/08/2016  . GERD (gastroesophageal reflux disease) 09/17/2008  . Rheumatoid arthritis ([place]) 09/17/2006  . Anxiety 09/17/2006  . Hypertension 09/17/2005  .  Allergic rhinitis 09/17/1996    Past Medical History:  Diagnosis Date  . Arthritis   . Hypertension   . Rheumatoid arthritis ([place])     Family History  Problem Relation Age of Onset  . Colon polyps Mother   . Diabetes Brother   . Diabetes Brother   . Hypertension Daughter   . Diabetes Daughter   . Healthy Son   . Colon cancer Cousin    Past Surgical History:  Procedure Laterality Date  . COLONOSCOPY N/A 04/09/2018   Procedure: COLONOSCOPY;  Surgeon: Danie Binder, MD;  [facility];  Service: Endoscopy;  Laterality: N/A;  9:30  . POLYPECTOMY  04/09/2018   Procedure: POLYPECTOMY;  Surgeon: Danie Binder, MD;  [facility];  Service: Endoscopy;;  ascending, descending colon,rectal, transverse,  . 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   Immunization History  Administered Date(s) Administered  . Tdap 04/03/2015    Objective: Vital Signs: There were no vitals taken for this visit.   Physical Exam   Musculoskeletal Exam: ***  CDAI Exam: CDAI Score: Not documented Patient Global Assessment: Not documented; Provider Global Assessment: Not documented Swollen: Not documented; Tender: Not documented Joint Exam   Not documented   There is currently no information documented on the homunculus. Go to the Rheumatology activity and complete the homunculus joint exam.  Investigation: No additional findings.  Imaging: No results found.  Recent Labs: Lab Results  Component Value Date   WBC 7.7 08/11/2018   HGB 12.1 08/11/2018   PLT 203 08/11/2018   NA 135 08/11/2018   K 4.3 08/11/2018   CL 100  08/11/2018   CO2 28 08/11/2018   GLUCOSE 133 (H) 08/11/2018   BUN 18 08/11/2018   CREATININE 0.98 08/11/2018   BILITOT 0.3 08/11/2018   ALKPHOS 57 04/04/2017   AST 16 08/11/2018   ALT 13 08/11/2018   PROT 6.0 (L) 08/11/2018   ALBUMIN 4.0 04/04/2017   CALCIUM 9.1 08/11/2018    GFRAA 73 08/11/2018   QFTBGOLDPLUS NEGATIVE 09/18/2017    Speciality Comments: No specialty comments available.  Procedures:  No procedures performed Allergies: Sulfamethoxazole   Assessment / Plan:     Visit Diagnoses: Rheumatoid arthritis involving multiple sites with positive rheumatoid factor (HCC)  High risk medication use - Orencia sq inj  Trochanteric bursitis of both hips  Medial epicondylitis of right elbow  Primary osteoarthritis of both hands  Primary osteoarthritis of both feet  Other fatigue  History of anxiety  History of hypertension  History of allergic rhinitis  History of gastroesophageal reflux (GERD)  Former smoker   Orders: No orders of the defined types were placed in this encounter.  No orders of the defined types were placed in this encounter.   Face-to-face time spent with patient was *** minutes. Greater than 50% of time was spent in counseling and coordination of care.  Follow-Up Instructions: No follow-ups on file.   Ofilia Neas, PA-C  Note - This record has been created using Dragon software.  Chart creation errors have been sought, but may not always  have been located. Such creation errors do not reflect on  the standard of medical care.

## 2018-09-04 ENCOUNTER — Ambulatory Visit: Payer: Medicare Other | Admitting: Physician Assistant

## 2018-09-04 NOTE — Progress Notes (Deleted)
Office Visit Note  Patient: Samantha Yates             Date of Birth: Oct 18, 1960           MRN: [id number]             PCP: Sharilyn Sites, MD Referring: Sharilyn Sites, MD Visit Date: 09/16/2018 Occupation: @GUAROCC @  Subjective:  No chief complaint on file.   History of Present Illness: Samantha Yates is a 57 y.o. female ***   Activities of Daily Living:  Patient reports morning stiffness for *** {minute/hour:19697}.   Patient {ACTIONS;DENIES/REPORTS:21021675::"Denies"} nocturnal pain.  Difficulty dressing/grooming: {ACTIONS;DENIES/REPORTS:21021675::"Denies"} Difficulty climbing stairs: {ACTIONS;DENIES/REPORTS:21021675::"Denies"} Difficulty getting out of chair: {ACTIONS;DENIES/REPORTS:21021675::"Denies"} Difficulty using hands for taps, buttons, cutlery, and/or writing: {ACTIONS;DENIES/REPORTS:21021675::"Denies"}  No Rheumatology ROS completed.   PMFS History:  Patient Active Problem List   Diagnosis Date Noted  . Special screening for malignant neoplasms, colon   . High risk medication use 11/08/2016  . Primary osteoarthritis of both hands 11/08/2016  . Primary osteoarthritis of both feet 11/08/2016  . History of gastroesophageal reflux (GERD) 11/08/2016  . History of hypertension 11/08/2016  . History of anxiety 11/08/2016  . History of allergic rhinitis 11/08/2016  . Former smoker 11/08/2016  . GERD (gastroesophageal reflux disease) 09/17/2008  . Rheumatoid arthritis ([place]) 09/17/2006  . Anxiety 09/17/2006  . Hypertension 09/17/2005  . Allergic rhinitis 09/17/1996    Past Medical History:  Diagnosis Date  . Arthritis   . Hypertension   . Rheumatoid arthritis ([place])     Family History  Problem Relation Age of Onset  . Colon polyps Mother   . Diabetes Brother   . Diabetes Brother   . Hypertension Daughter   . Diabetes Daughter   . Healthy Son   . Colon cancer Cousin    Past Surgical History:  Procedure Laterality Date  . COLONOSCOPY N/A 04/09/2018     Procedure: COLONOSCOPY;  Surgeon: Danie Binder, MD;  [facility];  Service: Endoscopy;  Laterality: N/A;  9:30  . POLYPECTOMY  04/09/2018   Procedure: POLYPECTOMY;  Surgeon: Danie Binder, MD;  [facility];  Service: Endoscopy;;  ascending, descending colon,rectal, transverse,  . 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    Objective: Vital Signs: There were no vitals taken for this visit.   Physical Exam   Musculoskeletal Exam: ***  CDAI Exam: CDAI Score: Not documented Patient Global Assessment: Not documented; Provider Global Assessment: Not documented Swollen: Not documented; Tender: Not documented Joint Exam   Not documented   There is currently no information documented on the homunculus. Go to the Rheumatology activity and complete the homunculus joint exam.  Investigation: No additional findings.  Imaging: No results found.  Recent Labs: Lab Results  Component Value Date   WBC 7.7 08/11/2018   HGB 12.1 08/11/2018   PLT 203 08/11/2018   NA 135 08/11/2018   K 4.3 08/11/2018   CL 100 08/11/2018   CO2 28 08/11/2018   GLUCOSE 133 (H) 08/11/2018   BUN 18 08/11/2018   CREATININE 0.98 08/11/2018   BILITOT 0.3 08/11/2018   ALKPHOS 57 04/04/2017   AST 16 08/11/2018   ALT 13 08/11/2018   PROT 6.0 (L) 08/11/2018   ALBUMIN 4.0 04/04/2017   CALCIUM 9.1 08/11/2018   GFRAA 73 08/11/2018   QFTBGOLDPLUS NEGATIVE 09/18/2017    Speciality Comments: No specialty comments available.  Procedures:  No procedures performed Allergies: Sulfamethoxazole   Assessment / Plan:     Visit Diagnoses: No diagnosis found.   Orders: No orders of the defined types were placed in this encounter.  No orders of the defined types were placed in this encounter.   Face-to-face time spent with patient was *** minutes. Greater than 50% of time was spent in counseling and coordination  of care.  Follow-Up Instructions: No follow-ups on file.   Ofilia Neas, PA-C  Note - This record has been created using Dragon software.  Chart creation errors have been sought, but may not always  have been located. Such creation errors do not reflect on  the standard of medical care.

## 2018-09-08 NOTE — Progress Notes (Signed)
Office Visit Note  Patient: Samantha Yates             Date of Birth: June 02, 1961           MRN: [id number]             PCP: Sharilyn Sites, MD Referring: Sharilyn Sites, MD Visit Date: 09/15/2018 Occupation: @GUAROCC @  Subjective:  Left shoulder pain    History of Present Illness: Samantha Yates is a 57 y.o. female with history of seropositive rheumatoid arthritis and osteoarthritis.  Patient is on Orencia subcutaneous injections once weekly.  She reports that overall she has felt considerably better since discontinuing methotrexate.  She states that she no longer has fatigue and her nausea has resolved.  She does not want to restart on methotrexate at this time.  She has been tolerating Orencia subcutaneous injections and has not had any injection site reactions recently.  She states that [name] is "the best medication I have been on since I was diagnosed."  She reports she continues to have chronic left shoulder pain which wakes her up at night.  She states that she has occasional hand and feet pain but denies any joint swelling at this time.  She states that she has increased pain in both feet with overuse and if she is standing for prolonged peers of time.  She says she has occasional left knee joint pain.  She states that about 2 weeks ago her left knee was bothering her and she wore a brace for 3 days which improved her symptoms.  She reports that she will be starting [employer].  She plans on starting arthritis water aerobics.    Activities of Daily Living:  Patient reports morning stiffness for 1 hours.   Patient Reports nocturnal pain.  Difficulty dressing/grooming: Denies Difficulty climbing stairs: Reports Difficulty getting out of chair: Reports Difficulty using hands for taps, buttons, cutlery, and/or writing: Reports  Review of Systems  Constitutional: Negative for fatigue.  HENT: Positive for mouth dryness. Negative for mouth sores, trouble swallowing,  trouble swallowing and nose dryness.   Eyes: Positive for dryness. Negative for pain, redness, itching and visual disturbance.  Respiratory: Negative for cough, hemoptysis, shortness of breath, wheezing and difficulty breathing.   Cardiovascular: Negative for chest pain, palpitations, hypertension and swelling in legs/feet.  Gastrointestinal: Negative for abdominal pain, blood in stool, constipation, diarrhea, nausea and vomiting.  Endocrine: Negative for increased urination.  Genitourinary: Negative for painful urination, nocturia and pelvic pain.  Musculoskeletal: Positive for arthralgias, joint pain, joint swelling and morning stiffness. Negative for myalgias, muscle weakness, muscle tenderness and myalgias.  Skin: Negative for color change, pallor, rash, hair loss, nodules/bumps, skin tightness, ulcers and sensitivity to sunlight.  Allergic/Immunologic: Negative for susceptible to infections.  Neurological: Positive for headaches. Negative for dizziness, light-headedness, numbness, memory loss and weakness.  Hematological: Negative for swollen glands.  Psychiatric/Behavioral: Negative for depressed mood, confusion and sleep disturbance. The patient is not nervous/anxious.     PMFS History:  Patient Active Problem List   Diagnosis Date Noted  . Special screening for malignant neoplasms, colon   . High risk medication use 11/08/2016  . Primary osteoarthritis of both hands 11/08/2016  . Primary osteoarthritis of both feet 11/08/2016  . History of gastroesophageal reflux (GERD) 11/08/2016  . History of hypertension 11/08/2016  . History of anxiety 11/08/2016  . History of allergic rhinitis 11/08/2016  . Former smoker 11/08/2016  . GERD (gastroesophageal reflux disease) 09/17/2008  .  Rheumatoid arthritis ([place]) 09/17/2006  . Anxiety 09/17/2006  . Hypertension 09/17/2005  . Allergic rhinitis 09/17/1996    Past Medical History:  Diagnosis Date  . Arthritis   . Hypertension   .  Rheumatoid arthritis ([place])     Family History  Problem Relation Age of Onset  . Colon polyps Mother   . Diabetes Brother   . Diabetes Brother   . Hypertension Daughter   . Diabetes Daughter   . Healthy Son   . Colon cancer Cousin    Past Surgical History:  Procedure Laterality Date  . COLONOSCOPY N/A 04/09/2018   Procedure: COLONOSCOPY;  Surgeon: Danie Binder, MD;  [facility];  Service: Endoscopy;  Laterality: N/A;  9:30  . POLYPECTOMY  04/09/2018   Procedure: POLYPECTOMY;  Surgeon: Danie Binder, MD;  [facility];  Service: Endoscopy;;  ascending, descending colon,rectal, transverse,  . 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   Immunization History  Administered Date(s) Administered  . Tdap 04/03/2015    Objective: Vital Signs: BP 95/75 (BP Location: Left Arm, Patient Position: Sitting, Cuff Size: Normal)   Pulse 85   Resp 13   Ht 5\' 8"  (1.727 m)   Wt 176 lb (79.8 kg)   BMI 26.76 kg/m    Physical Exam Vitals signs and nursing note reviewed.  Constitutional:      Appearance: She is well-developed.  HENT:     Head: Normocephalic and atraumatic.  Eyes:     Conjunctiva/sclera: Conjunctivae normal.  Neck:     Musculoskeletal: Normal range of motion.  Cardiovascular:     Rate and Rhythm: Normal rate and regular rhythm.     Heart sounds: Normal heart sounds.  Pulmonary:     Effort: Pulmonary effort is normal.     Breath sounds: Normal breath sounds.  Abdominal:     General: Bowel sounds are normal.     Palpations: Abdomen is soft.  Lymphadenopathy:     Cervical: No cervical adenopathy.  Skin:    General: Skin is warm and dry.     Capillary Refill: Capillary refill takes less than 2 seconds.  Neurological:     Mental Status: She is alert and oriented to person, place, and time.  Psychiatric:        Behavior: Behavior normal.      Musculoskeletal Exam: C-spine  limited ROM.  Thoracic spine and lumbar spine good range of motion.  No midline spinal tenderness.  No SI joint tenderness.  Shoulder joints, elbow joints, wrist joints, MCPs, PIPs, DIPs good range of motion with no synovitis.  She has PIP and DIP synovial thickening consistent with osteoarthritis of bilateral hands.  She has synovial thickening of the right first and third MCP joints.  Hip joints, knee joints, ankle joints, MCPs, PIPs, DIPs good range of motion no synovitis.  No warmth or effusion of bilateral knee joints.  No tenderness or swelling of ankle joints.  She has tenderness over bilateral trochanteric bursa.  CDAI Exam: CDAI Score: 0.5  Patient Global Assessment: 3 (mm); Provider Global Assessment: 2 (mm) Swollen: 0 ; Tender: 0  Joint Exam   Not documented   There is currently no information documented on the homunculus. Go to the Rheumatology activity and complete the homunculus joint exam.  Investigation: No additional findings.  Imaging: No results found.  Recent Labs: Lab Results  Component Value Date   WBC  7.7 08/11/2018   HGB 12.1 08/11/2018   PLT 203 08/11/2018   NA 135 08/11/2018   K 4.3 08/11/2018   CL 100 08/11/2018   CO2 28 08/11/2018   GLUCOSE 133 (H) 08/11/2018   BUN 18 08/11/2018   CREATININE 0.98 08/11/2018   BILITOT 0.3 08/11/2018   ALKPHOS 57 04/04/2017   AST 16 08/11/2018   ALT 13 08/11/2018   PROT 6.0 (L) 08/11/2018   ALBUMIN 4.0 04/04/2017   CALCIUM 9.1 08/11/2018   GFRAA 73 08/11/2018   QFTBGOLDPLUS NEGATIVE 09/18/2017    Speciality Comments: No specialty comments available.  Procedures:  No procedures performed Allergies: Sulfamethoxazole     Assessment / Plan:     Visit Diagnoses: Rheumatoid arthritis involving multiple sites with positive rheumatoid factor (HCC) - +RF,+anti-CCP with nodulosis: She has no synovitis on exam.  She has not had any recent rheumatoid arthritis flares.  She has occasional discomfort in bilateral hands  and bilateral feet.  She has chronic pain in the left shoulder and intermittent discomfort in her left knee joint.  She is currently on Orencia subcutaneous injections once weekly.  She has not missed any doses.  She is been tolerating it well.  Her level of fatigue has improved and the nausea has resolved since discontinuing methotrexate.  She does not want to restart her methotrexate at this time.  She is clinically doing well on weekly subcutaneous Orencia injections.  She was advised to notify us if she has increased joint pain or joint swelling.  She will follow-up in the office in 5 months.  High risk medication use - Orencia SQ. CBC and CMP were drawn on 08/11/2018.  TB gold was negative on 09/18/2017.  She will be due for lab work in February and every 3 months.  Standing orders for CBC and CMP are in place.  Future order for TB gold was placed today.- Plan: QuantiFERON-TB Gold Plus  Primary osteoarthritis of both hands: She has PIP and DIP synovial thickening consistent with osteoarthritis of bilateral hands.  She has complete fist formation bilaterally.  Joint protection and muscle strengthening were discussed.  Primary osteoarthritis of both feet: She has mild osteoarthritic changes in bilateral feet.  She has no discomfort in her feet at this time but has occasional pain if she stands for prolonged periods of time.   Trochanteric bursitis of both hips: She has tenderness over bilateral trochanteric bursa.  Medial epicondylitis of right elbow: Resolved  Other fatigue: Her level of fatigue has improved significantly since discontinuing methotrexate.  She is encouraged to stay active and exercise on a regular basis.  She recently signed up for [employer] and will be starting to go tomorrow.  She will be signing up for an arthritis water aerobics class as well.  Other medical conditions are listed as follows:  History of gastroesophageal reflux (GERD)  History of allergic  rhinitis  History of hypertension  History of anxiety   Orders: Orders Placed This Encounter  Procedures  . QuantiFERON-TB Gold Plus   Meds ordered this encounter  Medications  . diclofenac sodium (VOLTAREN) 1 % GEL    Sig: Apply 3 grams to 3 large joints up to 3 times daily    Dispense:  3 Tube    Refill:  3    Follow-Up Instructions: Return in about 5 months (around 02/14/2019) for Rheumatoid arthritis, Osteoarthritis.   Ofilia Neas, PA-C  Note - This record has been created using Dragon software.  Chart  creation errors have been sought, but may not always  have been located. Such creation errors do not reflect on  the standard of medical care.

## 2018-09-15 ENCOUNTER — Ambulatory Visit: Payer: Medicare Other | Admitting: Physician Assistant

## 2018-09-15 ENCOUNTER — Encounter: Payer: Self-pay | Admitting: Physician Assistant

## 2018-09-15 VITALS — BP 95/75 | HR 85 | Resp 13 | Ht 68.0 in | Wt 176.0 lb

## 2018-09-15 DIAGNOSIS — M19041 Primary osteoarthritis, right hand: Secondary | ICD-10-CM | POA: Diagnosis not present

## 2018-09-15 DIAGNOSIS — M19071 Primary osteoarthritis, right ankle and foot: Secondary | ICD-10-CM

## 2018-09-15 DIAGNOSIS — Z8719 Personal history of other diseases of the digestive system: Secondary | ICD-10-CM

## 2018-09-15 DIAGNOSIS — Z8709 Personal history of other diseases of the respiratory system: Secondary | ICD-10-CM

## 2018-09-15 DIAGNOSIS — R5383 Other fatigue: Secondary | ICD-10-CM

## 2018-09-15 DIAGNOSIS — Z79899 Other long term (current) drug therapy: Secondary | ICD-10-CM | POA: Diagnosis not present

## 2018-09-15 DIAGNOSIS — M19072 Primary osteoarthritis, left ankle and foot: Secondary | ICD-10-CM

## 2018-09-15 DIAGNOSIS — M0579 Rheumatoid arthritis with rheumatoid factor of multiple sites without organ or systems involvement: Secondary | ICD-10-CM | POA: Diagnosis not present

## 2018-09-15 DIAGNOSIS — M7701 Medial epicondylitis, right elbow: Secondary | ICD-10-CM

## 2018-09-15 DIAGNOSIS — M19042 Primary osteoarthritis, left hand: Secondary | ICD-10-CM

## 2018-09-15 DIAGNOSIS — M7061 Trochanteric bursitis, right hip: Secondary | ICD-10-CM | POA: Diagnosis not present

## 2018-09-15 DIAGNOSIS — M7062 Trochanteric bursitis, left hip: Secondary | ICD-10-CM

## 2018-09-15 DIAGNOSIS — Z8659 Personal history of other mental and behavioral disorders: Secondary | ICD-10-CM

## 2018-09-15 DIAGNOSIS — Z8679 Personal history of other diseases of the circulatory system: Secondary | ICD-10-CM

## 2018-09-15 MED ORDER — DICLOFENAC SODIUM 1 % TD GEL: TRANSDERMAL | 3 refills | Status: DC

## 2018-09-15 NOTE — Patient Instructions (Signed)
Standing Labs We placed an order today for your standing lab work.    Please come back and get your standing labs in February and every 3 months   We have open lab Monday through Friday from 8:30-11:30 AM and 1:30-4:00 PM  at the office of Dr. Shaili Deveshwar.   You may experience shorter wait times on Monday and Friday afternoons. The office is located at [address], [address], [place], [place] [address] No appointment is necessary.   Labs are drawn by [facility].  You may receive a bill from [facility] for your lab work.  If you wish to have your labs drawn at another location, please call the office 24 hours in advance to send orders.  If you have any questions regarding directions or hours of operation,  please call [phone].   Just as a reminder please drink plenty of water prior to coming for your lab work. Thanks!   

## 2018-09-16 ENCOUNTER — Ambulatory Visit: Payer: Medicare Other | Admitting: Physician Assistant

## 2018-09-24 NOTE — Telephone Encounter (Signed)
Received fax from [employer], patient's application has been APPROVED. Coverage dates are 09/24/2018 to 09/17/2019.   Will send document to scan center.   Phone# [phone] Fax# [phone]  10:37 AM Beatriz Chancellor, CPhT

## 2018-09-29 DIAGNOSIS — Z0001 Encounter for general adult medical examination with abnormal findings: Secondary | ICD-10-CM | POA: Diagnosis not present

## 2018-09-29 DIAGNOSIS — Z1389 Encounter for screening for other disorder: Secondary | ICD-10-CM | POA: Diagnosis not present

## 2018-11-13 ENCOUNTER — Other Ambulatory Visit: Payer: Self-pay

## 2018-11-13 DIAGNOSIS — Z79899 Other long term (current) drug therapy: Secondary | ICD-10-CM | POA: Diagnosis not present

## 2018-11-14 NOTE — Progress Notes (Signed)
WNLs

## 2018-11-15 LAB — CBC WITH DIFFERENTIAL/PLATELET
Absolute Monocytes: 503 cells/uL (ref 200–950)
BASOS ABS: 53 {cells}/uL (ref 0–200)
BASOS PCT: 0.7 %
EOS PCT: 1.6 %
Eosinophils Absolute: 120 cells/uL (ref 15–500)
HCT: 32.9 % — ABNORMAL LOW (ref 35.0–45.0)
Hemoglobin: 11.5 g/dL — ABNORMAL LOW (ref 11.7–15.5)
LYMPHS ABS: 3180 {cells}/uL (ref 850–3900)
MCH: 31.7 pg (ref 27.0–33.0)
MCHC: 35 g/dL (ref 32.0–36.0)
MCV: 90.6 fL (ref 80.0–100.0)
MPV: 11.5 fL (ref 7.5–12.5)
Monocytes Relative: 6.7 %
NEUTROS ABS: 3645 {cells}/uL (ref 1500–7800)
Neutrophils Relative %: 48.6 %
Platelets: 219 10*3/uL (ref 140–400)
RBC: 3.63 10*6/uL — AB (ref 3.80–5.10)
RDW: 12.3 % (ref 11.0–15.0)
Total Lymphocyte: 42.4 %
WBC: 7.5 10*3/uL (ref 3.8–10.8)

## 2018-11-15 LAB — COMPLETE METABOLIC PANEL WITH GFR
AG Ratio: 1.6 (calc) (ref 1.0–2.5)
ALT: 12 U/L (ref 6–29)
AST: 14 U/L (ref 10–35)
Albumin: 3.9 g/dL (ref 3.6–5.1)
Alkaline phosphatase (APISO): 63 U/L (ref 37–153)
BILIRUBIN TOTAL: 0.3 mg/dL (ref 0.2–1.2)
BUN: 15 mg/dL (ref 7–25)
CHLORIDE: 103 mmol/L (ref 98–110)
CO2: 25 mmol/L (ref 20–32)
Calcium: 9.4 mg/dL (ref 8.6–10.4)
Creat: 0.9 mg/dL (ref 0.50–1.05)
GFR, EST AFRICAN AMERICAN: 81 mL/min/{1.73_m2} (ref >=60)
GFR, Est Non African American: 70 mL/min/{1.73_m2} (ref >=60)
GLUCOSE: 110 mg/dL — AB (ref 65–99)
Globulin: 2.4 g/dL (calc) (ref 1.9–3.7)
Potassium: 4.1 mmol/L (ref 3.5–5.3)
Sodium: 136 mmol/L (ref 135–146)
TOTAL PROTEIN: 6.3 g/dL (ref 6.1–8.1)

## 2018-11-15 LAB — QUANTIFERON-TB GOLD PLUS
Mitogen-NIL: >10 IU/mL
NIL: 0.39 [IU]/mL
QUANTIFERON-TB GOLD PLUS: NEGATIVE

## 2018-11-17 NOTE — Progress Notes (Signed)
TB gold negative

## 2018-11-24 ENCOUNTER — Telehealth: Payer: Self-pay | Admitting: Rheumatology

## 2018-11-24 NOTE — Telephone Encounter (Signed)
Patient advised per Dr. Estanislado Pandy to contact PCP. Patient verbalized understanding .

## 2018-11-24 NOTE — Telephone Encounter (Signed)
Patient called stating "her tongue is painful and burning."  Patient states "she thinks it has something to do with yeast in her mouth."  Patient is requesting a prescription to be sent to [facility] in [place].

## 2018-11-24 NOTE — Telephone Encounter (Signed)
Per Dr.Deveshwar patient should see PCP.   Attempted to contact patient and left message for patient to call the office.

## 2018-12-25 ENCOUNTER — Telehealth: Payer: Self-pay | Admitting: Rheumatology

## 2018-12-25 MED ORDER — ABATACEPT 125 MG/ML ~~LOC~~ SOAJ: 125.0000 mg | SUBCUTANEOUS | 2 refills | Status: DC

## 2018-12-25 NOTE — Telephone Encounter (Signed)
Last Visit: 09/15/18 Next Visit: 02/02/19 Labs: 11/13/18 WNL TB Gold: 11/13/18  Okay to refill per Dr. Estanislado Pandy

## 2018-12-25 NOTE — Telephone Encounter (Signed)
Patient left a voicemail requesting prescription refill of Orencia.

## 2019-01-26 NOTE — Progress Notes (Signed)
Office Visit Note  Patient: Samantha Yates             Date of Birth: 1960-11-20           MRN: [id number]             PCP: Sharilyn Sites, MD Referring: Sharilyn Sites, MD Visit Date: 02/02/2019 Occupation: @GUAROCC @  Subjective:  Medication monitoring.    History of Present Illness: Samantha Yates is a 58 y.o. female with history of seropositive rheumatoid arthritis and osteoarthritis.  According to patient she has been doing fairly well as regards to her rheumatoid arthritis.  She has been active in the yard.  She has been taking Orencia weekly.  She has noticed some puffiness in her right elbow.  Activities of Daily Living:  Patient reports morning stiffness for 15 minutes.   Patient Reports nocturnal pain.  Difficulty dressing/grooming: Denies Difficulty climbing stairs: Reports Difficulty getting out of chair: Reports Difficulty using hands for taps, buttons, cutlery, and/or writing: Denies  Review of Systems  Constitutional: Positive for fatigue. Negative for night sweats, weight gain and weight loss.  HENT: Positive for mouth dryness. Negative for mouth sores, trouble swallowing, trouble swallowing and nose dryness.   Eyes: Positive for dryness. Negative for pain, redness and visual disturbance.  Respiratory: Negative for cough, shortness of breath and difficulty breathing.   Cardiovascular: Negative for chest pain, palpitations, hypertension, irregular heartbeat and swelling in legs/feet.  Gastrointestinal: Negative for blood in stool, constipation and diarrhea.  Endocrine: Negative for increased urination.  Genitourinary: Negative for vaginal dryness.  Musculoskeletal: Positive for arthralgias, joint pain and morning stiffness. Negative for joint swelling, myalgias, muscle weakness, muscle tenderness and myalgias.  Skin: Negative for color change, rash, hair loss, skin tightness, ulcers and sensitivity to sunlight.  Allergic/Immunologic: Negative for susceptible to  infections.  Neurological: Negative for dizziness, memory loss, night sweats and weakness.  Hematological: Negative for swollen glands.  Psychiatric/Behavioral: Negative for depressed mood and sleep disturbance. The patient is not nervous/anxious.     PMFS History:  Patient Active Problem List   Diagnosis Date Noted  . Special screening for malignant neoplasms, colon   . High risk medication use 11/08/2016  . Primary osteoarthritis of both hands 11/08/2016  . Primary osteoarthritis of both feet 11/08/2016  . History of gastroesophageal reflux (GERD) 11/08/2016  . History of hypertension 11/08/2016  . History of anxiety 11/08/2016  . History of allergic rhinitis 11/08/2016  . Former smoker 11/08/2016  . GERD (gastroesophageal reflux disease) 09/17/2008  . Rheumatoid arthritis ([place]) 09/17/2006  . Anxiety 09/17/2006  . Hypertension 09/17/2005  . Allergic rhinitis 09/17/1996    Past Medical History:  Diagnosis Date  . Arthritis   . Hypertension   . Rheumatoid arthritis ([place])     Family History  Problem Relation Age of Onset  . Colon polyps Mother   . Diabetes Brother   . Diabetes Brother   . Hypertension Daughter   . Diabetes Daughter   . Healthy Son   . Colon cancer Cousin    Past Surgical History:  Procedure Laterality Date  . COLONOSCOPY N/A 04/09/2018   Procedure: COLONOSCOPY;  Surgeon: Danie Binder, MD;  [facility]: [facility];  Service: Endoscopy;  Laterality: N/A;  9:30  . POLYPECTOMY  04/09/2018   Procedure: POLYPECTOMY;  Surgeon: Danie Binder, MD;  Location: [facility];  Service: Endoscopy;;  ascending, descending colon,rectal, transverse,  . 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   Immunization History  Administered Date(s) Administered  . Tdap 04/03/2015     Objective: Vital Signs: BP 104/67 (BP Location: Left Arm, Patient Position: Sitting, Cuff Size: Normal)   Pulse 66    Resp 13   Ht 5' 7.5" (1.715 m)   Wt 172 lb 6.4 oz (78.2 kg)   BMI 26.60 kg/m    Physical Exam Vitals signs and nursing note reviewed.  Constitutional:      Appearance: She is well-developed.  HENT:     Head: Normocephalic and atraumatic.  Eyes:     Conjunctiva/sclera: Conjunctivae normal.  Neck:     Musculoskeletal: Normal range of motion.  Cardiovascular:     Rate and Rhythm: Normal rate and regular rhythm.     Heart sounds: Normal heart sounds.  Pulmonary:     Effort: Pulmonary effort is normal.     Breath sounds: Normal breath sounds.  Abdominal:     General: Bowel sounds are normal.     Palpations: Abdomen is soft.  Lymphadenopathy:     Cervical: No cervical adenopathy.  Skin:    General: Skin is warm and dry.     Capillary Refill: Capillary refill takes less than 2 seconds.  Neurological:     Mental Status: She is alert and oriented to person, place, and time.  Psychiatric:        Behavior: Behavior normal.      Musculoskeletal Exam: Plastic lumbar spine good range of motion.  Shoulder joints elbow joints wrist joint MCPs PIPs DIPs been good range of motion with no synovitis.  She is some DIP and PIP thickening in her hands.  Hip joints knee joints ankles MTPs PIPs been good range of motion.  She is tenderness on palpation over trochanteric bursa bilaterally.  She also had tenderness over right medial epicondyle area.  CDAI Exam: CDAI Score: 0.4  Patient Global Assessment: 2 (mm); Provider Global Assessment: 2 (mm) Swollen: 0 ; Tender: 0  Joint Exam   Not documented   There is currently no information documented on the homunculus. Go to the Rheumatology activity and complete the homunculus joint exam.  Investigation: No additional findings.  Imaging: No results found.  Recent Labs: Lab Results  Component Value Date   WBC 7.5 11/13/2018   HGB 11.5 (L) 11/13/2018   PLT 219 11/13/2018   NA 136 11/13/2018   K 4.1 11/13/2018   CL 103 11/13/2018   CO2 25  11/13/2018   GLUCOSE 110 (H) 11/13/2018   BUN 15 11/13/2018   CREATININE 0.90 11/13/2018   BILITOT 0.3 11/13/2018   ALKPHOS 57 04/04/2017   AST 14 11/13/2018   ALT 12 11/13/2018   PROT 6.3 11/13/2018   ALBUMIN 4.0 04/04/2017   CALCIUM 9.4 11/13/2018   GFRAA 81 11/13/2018   QFTBGOLDPLUS NEGATIVE 11/13/2018    Speciality Comments: No specialty comments available.  Procedures:  No procedures performed Allergies: Sulfamethoxazole   Assessment / Plan:     Visit Diagnoses: Rheumatoid arthritis involving multiple sites with positive rheumatoid factor (HCC) - +RF,+anti-CCP with nodulosis.  Patient is clinically doing well with no synovitis on examination.  She is responded very well to [place].  High risk medication use - She is currently on Orencia 125 mg weekly.  Most recent TB Gold negative on 11/13/2018 and will monitor yearly.  Most recent CBC/CMP within normal limits on 11/13/2018.  Due for CBC/CMP today and will monitor every 3 months.  Standing orders are in place.  Recommend annual influenza, Pneumovax 23, Prevnar 13, and Shingrix as indicated.. -I will obtain labs today and then every 3 months.  Plan: CBC with Differential/Platelet, COMPLETE METABOLIC PANEL WITH GFR  Primary osteoarthritis of both hands-joint protection muscle strengthening was discussed.  Primary osteoarthritis of both feet-doing better.  Trochanteric bursitis of both hips-she continues to have some discomfort in bilateral IT band area.  Have given her IT band exercises.  Medial epicondylitis of right elbow-her medial epicondylitis is exacerbated by gardening.  Stretching exercises were discussed.  Other medical problems are listed as follows:  Other fatigue  History of anxiety  History of allergic rhinitis  History of gastroesophageal reflux (GERD)  History of hypertension  Former smoker   Orders: Orders Placed This Encounter  Procedures  . CBC with Differential/Platelet  . COMPLETE METABOLIC  PANEL WITH GFR   No orders of the defined types were placed in this encounter.     Follow-Up Instructions: Return in about 5 months (around 07/05/2019) for Rheumatoid arthritis, Osteoarthritis.   Bo Merino, MD  Note - This record has been created using Editor, commissioning.  Chart creation errors have been sought, but may not always  have been located. Such creation errors do not reflect on  the standard of medical care.

## 2019-02-02 ENCOUNTER — Other Ambulatory Visit: Payer: Self-pay

## 2019-02-02 ENCOUNTER — Encounter: Payer: Self-pay | Admitting: Physician Assistant

## 2019-02-02 ENCOUNTER — Ambulatory Visit: Payer: Medicare Other | Admitting: Rheumatology

## 2019-02-02 VITALS — BP 104/67 | HR 66 | Resp 13 | Ht 67.5 in | Wt 172.4 lb

## 2019-02-02 DIAGNOSIS — M7061 Trochanteric bursitis, right hip: Secondary | ICD-10-CM | POA: Diagnosis not present

## 2019-02-02 DIAGNOSIS — Z8679 Personal history of other diseases of the circulatory system: Secondary | ICD-10-CM

## 2019-02-02 DIAGNOSIS — Z8659 Personal history of other mental and behavioral disorders: Secondary | ICD-10-CM

## 2019-02-02 DIAGNOSIS — M19072 Primary osteoarthritis, left ankle and foot: Secondary | ICD-10-CM

## 2019-02-02 DIAGNOSIS — M7062 Trochanteric bursitis, left hip: Secondary | ICD-10-CM

## 2019-02-02 DIAGNOSIS — M19041 Primary osteoarthritis, right hand: Secondary | ICD-10-CM

## 2019-02-02 DIAGNOSIS — M19071 Primary osteoarthritis, right ankle and foot: Secondary | ICD-10-CM

## 2019-02-02 DIAGNOSIS — Z79899 Other long term (current) drug therapy: Secondary | ICD-10-CM | POA: Diagnosis not present

## 2019-02-02 DIAGNOSIS — M0579 Rheumatoid arthritis with rheumatoid factor of multiple sites without organ or systems involvement: Secondary | ICD-10-CM | POA: Diagnosis not present

## 2019-02-02 DIAGNOSIS — Z8709 Personal history of other diseases of the respiratory system: Secondary | ICD-10-CM

## 2019-02-02 DIAGNOSIS — Z87891 Personal history of nicotine dependence: Secondary | ICD-10-CM

## 2019-02-02 DIAGNOSIS — Z8719 Personal history of other diseases of the digestive system: Secondary | ICD-10-CM

## 2019-02-02 DIAGNOSIS — M7701 Medial epicondylitis, right elbow: Secondary | ICD-10-CM

## 2019-02-02 DIAGNOSIS — R5383 Other fatigue: Secondary | ICD-10-CM

## 2019-02-02 DIAGNOSIS — M19042 Primary osteoarthritis, left hand: Secondary | ICD-10-CM

## 2019-02-02 NOTE — Patient Instructions (Signed)
Iliotibial Band Syndrome Rehab  Ask your health care provider which exercises are safe for you. Do exercises exactly as told by your health care provider and adjust them as directed. It is normal to feel mild stretching, pulling, tightness, or discomfort as you do these exercises, but you should stop right away if you feel sudden pain or your pain gets worse. Do not begin these exercises until told by your health care provider.  Stretching and range of motion exercises  These exercises warm up your muscles and joints and improve the movement and flexibility of your hip and pelvis.  Exercise A: Quadriceps, prone    1. Lie on your abdomen on a firm surface, such as a bed or padded floor.  2. Bend your left / right knee and hold your ankle. If you cannot reach your ankle or pant leg, loop a belt around your foot and grab the belt instead.  3. Gently pull your heel toward your buttocks. Your knee should not slide out to the side. You should feel a stretch in the front of your thigh and knee.  4. Hold this position for __________ seconds.  Repeat __________ times. Complete this stretch __________ times a day.  Exercise B: Iliotibial band    1. Lie on your side with your left / right leg in the top position.  2. Bend both of your knees and grab your left / right ankle. Stretch out your bottom arm to help you balance.  3. Slowly bring your top knee back so your thigh goes behind your trunk.  4. Slowly lower your top leg toward the floor until you feel a gentle stretch on the outside of your left / right hip and thigh. If you do not feel a stretch and your knee will not fall farther, place the heel of your other foot on top of your knee and pull your knee down toward the floor with your foot.  5. Hold this position for __________ seconds.  Repeat __________ times. Complete this stretch __________ times a day.  Strengthening exercises  These exercises build strength and endurance in your hip and pelvis. Endurance is the  ability to use your muscles for a long time, even after they get tired.  Exercise C: Straight leg raises (hip abductors)    1. Lie on your side with your left / right leg in the top position. Lie so your head, shoulder, knee, and hip line up. You may bend your bottom knee to help you balance.  2. Roll your hips slightly forward so your hips are stacked directly over each other and your left / right knee is facing forward.  3. Tense the muscles in your outer thigh and lift your top leg 4-6 inches (10-15 cm).  4. Hold this position for __________ seconds.  5. Slowly return to the starting position. Let your muscles relax completely before doing another repetition.  Repeat __________ times. Complete this exercise __________ times a day.  Exercise D: Straight leg raises (hip extensors)  1. Lie on your abdomen on your bed or a firm surface. You can put a pillow under your hips if that is more comfortable.  2. Bend your left / right knee so your foot is straight up in the air.  3. Squeeze your buttock muscles and lift your left / right thigh off the bed. Do not let your back arch.  4. Tense this muscle as hard as you can without increasing any knee pain.  5. Hold   this position for __________ seconds.  6. Slowly lower your leg to the starting position and allow it to relax completely.  Repeat __________ times. Complete this exercise __________ times a day.  Exercise E: Hip hike  1. Stand sideways on a bottom step. Stand on your left / right leg with your other foot unsupported next to the step. You can hold onto the railing or wall if needed for balance.  2. Keep your knees straight and your torso square. Then, lift your left / right hip up toward the ceiling.  3. Slowly let your left / right hip lower toward the floor, past the starting position. Your foot should get closer to the floor. Do not lean or bend your knees.  Repeat __________ times. Complete this exercise __________ times a day.  This information is not  intended to replace advice given to you by your health care provider. Make sure you discuss any questions you have with your health care provider.  Document Released: 09/03/2005 Document Revised: 05/08/2016 Document Reviewed: 08/05/2015  Elsevier Interactive Patient Education © 2019 [employer]

## 2019-02-03 LAB — CBC WITH DIFFERENTIAL/PLATELET
Absolute Monocytes: 418 cells/uL (ref 200–950)
Basophils Absolute: 38 cells/uL (ref 0–200)
Basophils Relative: 0.5 %
Eosinophils Absolute: 228 cells/uL (ref 15–500)
Eosinophils Relative: 3 %
HCT: 36.3 % (ref 35.0–45.0)
Hemoglobin: 12.4 g/dL (ref 11.7–15.5)
Lymphs Abs: 3086 cells/uL (ref 850–3900)
MCH: 32 pg (ref 27.0–33.0)
MCHC: 34.2 g/dL (ref 32.0–36.0)
MCV: 93.6 fL (ref 80.0–100.0)
MPV: 11.3 fL (ref 7.5–12.5)
Monocytes Relative: 5.5 %
Neutro Abs: 3830 cells/uL (ref 1500–7800)
Neutrophils Relative %: 50.4 %
Platelets: 221 10*3/uL (ref 140–400)
RBC: 3.88 10*6/uL (ref 3.80–5.10)
RDW: 12.9 % (ref 11.0–15.0)
Total Lymphocyte: 40.6 %
WBC: 7.6 10*3/uL (ref 3.8–10.8)

## 2019-02-03 LAB — COMPLETE METABOLIC PANEL WITH GFR
AG Ratio: 1.6 (calc) (ref 1.0–2.5)
ALT: 11 U/L (ref 6–29)
AST: 15 U/L (ref 10–35)
Albumin: 3.9 g/dL (ref 3.6–5.1)
Alkaline phosphatase (APISO): 62 U/L (ref 37–153)
BUN: 10 mg/dL (ref 7–25)
CO2: 26 mmol/L (ref 20–32)
Calcium: 9.6 mg/dL (ref 8.6–10.4)
Chloride: 106 mmol/L (ref 98–110)
Creat: 0.81 mg/dL (ref 0.50–1.05)
GFR, Est African American: 92 mL/min/{1.73_m2} (ref >=60)
GFR, Est Non African American: 79 mL/min/{1.73_m2} (ref >=60)
Globulin: 2.4 g/dL (calc) (ref 1.9–3.7)
Glucose, Bld: 84 mg/dL (ref 65–99)
Potassium: 4.4 mmol/L (ref 3.5–5.3)
Sodium: 140 mmol/L (ref 135–146)
Total Bilirubin: 0.4 mg/dL (ref 0.2–1.2)
Total Protein: 6.3 g/dL (ref 6.1–8.1)

## 2019-02-03 NOTE — Progress Notes (Signed)
Within normal limits

## 2019-03-18 ENCOUNTER — Telehealth: Payer: Self-pay | Admitting: *Deleted

## 2019-03-18 MED ORDER — ORENCIA CLICKJECT 125 MG/ML ~~LOC~~ SOAJ: 125.0000 mg | SUBCUTANEOUS | Status: DC

## 2019-03-18 NOTE — Telephone Encounter (Signed)
Please schedule patient a follow up visit. Patient due October 2020. Thanks!

## 2019-03-18 NOTE — Telephone Encounter (Signed)
Last Visit: 02/02/19 Next Visit: due October 2020. Message sent to the front to schedule patient  Labs: 02/02/19 WNL TB Gold: 11/13/18 Neg   Okay to refill per Dr. Estanislado Pandy

## 2019-04-06 DIAGNOSIS — I10 Essential (primary) hypertension: Secondary | ICD-10-CM | POA: Diagnosis not present

## 2019-04-16 ENCOUNTER — Telehealth: Payer: Self-pay | Admitting: Rheumatology

## 2019-04-16 MED ORDER — PREDNISONE 5 MG PO TABS: ORAL_TABLET | ORAL | 0 refills | Status: DC

## 2019-04-16 NOTE — Telephone Encounter (Signed)
Patient advised a prescription for prednisone has been sent to the pharmacy. Patient advised to contact the office if symptoms persist after prednisone taper.

## 2019-04-16 NOTE — Telephone Encounter (Signed)
Ok to send in prednisone taper starting at 20 mg tapering by 5 mg every 4 days.

## 2019-04-16 NOTE — Telephone Encounter (Signed)
Patient called stating she was doing yardwork using a weedeater a couple of days ago and began experiencing pain in her left wrist.   Patient states her wrist is red, swollen, painful, and warm to touch.  Patient is requesting a prescription of Prednisone to be sent to [facility] in [place].

## 2019-04-27 ENCOUNTER — Other Ambulatory Visit (HOSPITAL_COMMUNITY): Payer: Self-pay | Admitting: Family Medicine

## 2019-04-27 DIAGNOSIS — Z1231 Encounter for screening mammogram for malignant neoplasm of breast: Secondary | ICD-10-CM

## 2019-06-09 NOTE — Progress Notes (Signed)
Office Visit Note  Patient: Samantha Yates             Date of Birth: June 19, 1961           MRN: [id number]             PCP: Sharilyn Sites, MD Referring: Sharilyn Sites, MD Visit Date: 06/23/2019 Occupation: @GUAROCC @  Subjective:  Left shoulder joint pain     History of Present Illness: Samantha Yates is a 58 y.o. female with history of seropositive rheumatoid arthritis and osteoarthritis.  She is on Orencia 125 mg sq weekly injections.  She has not missed any doses recently.  She has not had any recent infections.  She states that for the past 2 weeks she has been experiencing increased pain in the left shoulder joint.  She states that she has been more active recently and has been performing yard work which means it exacerbated the left shoulder discomfort.  She states it is not interfering with her normal activities.  She denies any other joint pain or joint swelling at this time.  She states that the trochanter bursitis bilaterally has resolved.  She states that the medial epicondylitis of the right elbow has also resolved.   Activities of Daily Living:  Patient reports morning stiffness for 30-45 minutes.   Patient Reports nocturnal pain.  Difficulty dressing/grooming: Denies Difficulty climbing stairs: Reports Difficulty getting out of chair: Denies Difficulty using hands for taps, buttons, cutlery, and/or writing: Denies  Review of Systems  Constitutional: Positive for fatigue.  HENT: Negative for mouth sores, mouth dryness and nose dryness.   Eyes: Positive for dryness. Negative for pain, itching and visual disturbance.  Respiratory: Negative for cough, hemoptysis, shortness of breath and difficulty breathing.   Cardiovascular: Negative for chest pain, palpitations, hypertension and swelling in legs/feet.  Gastrointestinal: Negative for abdominal pain, blood in stool, constipation and diarrhea.  Endocrine: Negative for increased urination.  Genitourinary: Negative for  difficulty urinating and painful urination.  Musculoskeletal: Positive for arthralgias, joint pain and morning stiffness. Negative for joint swelling, myalgias, muscle weakness, muscle tenderness and myalgias.  Skin: Negative for color change, pallor, rash, hair loss, nodules/bumps, skin tightness, ulcers and sensitivity to sunlight.  Allergic/Immunologic: Negative for susceptible to infections.  Neurological: Positive for headaches. Negative for dizziness, light-headedness, numbness, memory loss and weakness.  Hematological: Negative for swollen glands.  Psychiatric/Behavioral: Positive for sleep disturbance. Negative for depressed mood and confusion. The patient is not nervous/anxious.     PMFS History:  Patient Active Problem List   Diagnosis Date Noted  . Special screening for malignant neoplasms, colon   . High risk medication use 11/08/2016  . Primary osteoarthritis of both hands 11/08/2016  . Primary osteoarthritis of both feet 11/08/2016  . History of gastroesophageal reflux (GERD) 11/08/2016  . History of hypertension 11/08/2016  . History of anxiety 11/08/2016  . History of allergic rhinitis 11/08/2016  . Former smoker 11/08/2016  . GERD (gastroesophageal reflux disease) 09/17/2008  . Rheumatoid arthritis ([place]) 09/17/2006  . Anxiety 09/17/2006  . Hypertension 09/17/2005  . Allergic rhinitis 09/17/1996    Past Medical History:  Diagnosis Date  . Arthritis   . Hypertension   . Rheumatoid arthritis ([place])     Family History  Problem Relation Age of Onset  . Colon polyps Mother   . Diabetes Brother   . Diabetes Brother   . Hypertension Daughter   . Diabetes Daughter   . Healthy Son   . Colon cancer Cousin  Past Surgical History:  Procedure Laterality Date  . COLONOSCOPY N/A 04/09/2018   Procedure: COLONOSCOPY;  Surgeon: Danie Binder, MD;  Location: [facility];  Service: Endoscopy;  Laterality: N/A;  9:30  . POLYPECTOMY  04/09/2018   Procedure: POLYPECTOMY;   Surgeon: Danie Binder, MD;  Location: [facility];  Service: Endoscopy;;  ascending, descending colon,rectal, transverse,  . 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   Immunization History  Administered Date(s) Administered  . Tdap 04/03/2015     Objective: Vital Signs: BP 96/66 (BP Location: Left Arm, Patient Position: Sitting, Cuff Size: Normal)   Pulse 87   Resp 14   Ht 5' 7.5" (1.715 m)   Wt 171 lb 6.4 oz (77.7 kg)   BMI 26.45 kg/m    Physical Exam Vitals signs and nursing note reviewed.  Constitutional:      Appearance: She is well-developed.  HENT:     Head: Normocephalic and atraumatic.  Eyes:     Conjunctiva/sclera: Conjunctivae normal.  Neck:     Musculoskeletal: Normal range of motion.  Cardiovascular:     Rate and Rhythm: Normal rate and regular rhythm.     Heart sounds: Normal heart sounds.  Pulmonary:     Effort: Pulmonary effort is normal.     Breath sounds: Normal breath sounds.  Abdominal:     General: Bowel sounds are normal.     Palpations: Abdomen is soft.  Lymphadenopathy:     Cervical: No cervical adenopathy.  Skin:    General: Skin is warm and dry.     Capillary Refill: Capillary refill takes less than 2 seconds.  Neurological:     Mental Status: She is alert and oriented to person, place, and time.  Psychiatric:        Behavior: Behavior normal.      Musculoskeletal Exam: C-spine, thoracic spine, and lumbar spine good ROM.  No midline spinal tenderness.  No SI joint tenderness.  Left AC joint tenderness.  Left shoulder discomfort with abduction.  Right shoulder good ROM with no discomfort.  Elbow joints, wrist joints, MCPs, PIPs ,and DIPs good ROM with no synovitis.  Mucinous cyst present on right 2nd DIP.  DIP synovial thickening.  Hip joints, knee joints, ankle joints, MTPs, PIPs, and DIPs good ROM with no synovitis.  No warmth or effusion of knee joints.  No  tenderness or swelling of ankle joints.   CDAI Exam: CDAI Score: 0.2  Patient Global: 1 mm; Provider Global: 1 mm Swollen: 0 ; Tender: 0  Joint Exam   No joint exam has been documented for this visit   There is currently no information documented on the homunculus. Go to the Rheumatology activity and complete the homunculus joint exam.  Investigation: No additional findings.  Imaging: No results found.  Recent Labs: Lab Results  Component Value Date   WBC 9.7 06/17/2019   HGB 12.9 06/17/2019   PLT 225 06/17/2019   NA 136 06/17/2019   K 4.3 06/17/2019   CL 101 06/17/2019   CO2 27 06/17/2019   GLUCOSE 94 06/17/2019   BUN 14 06/17/2019   CREATININE 0.81 06/17/2019   BILITOT 0.3 06/17/2019   ALKPHOS 57 04/04/2017   AST 15 06/17/2019   ALT 12 06/17/2019   PROT 6.5 06/17/2019   ALBUMIN 4.0 04/04/2017   CALCIUM 9.4 06/17/2019   GFRAA 92 06/17/2019   QFTBGOLDPLUS NEGATIVE 11/13/2018  Speciality Comments: No specialty comments available.  Procedures:  No procedures performed Allergies: Sulfamethoxazole   Assessment / Plan:     Visit Diagnoses: Rheumatoid arthritis involving multiple sites with positive rheumatoid factor (HCC) - +RF,+anti-CCP with nodulosis: She has no synovitis on exam.  She has not had any recent rheumatoid arthritis flares.  She is clinically doing well on Orencia 125 mg subcutaneous injections every week.  She has not missed any doses recently.  She presents today with left acromioclavicular joint discomfort.  She declined x-rays and a cortisone injection today.  She was given a handout of shoulder exercises to perform and was encouraged use Voltaren gel topically as needed for pain relief.  She has no other joint pain or joint swelling at this time.  She will continue on Orencia as prescribed.  She does not need any refills at this time.  She was advised to notify us if she develops increased joint pain or joint swelling.  She will follow-up in the  office in 5 months.  High risk medication use - Orencia Clickject 0000000 mg every 7 days.  Last TB gold negative on 11/13/2018 and will monitor yearly.  CMP within normal limits and CBC stable on 06/17/2019.  She will return for lab work in December and every 3 months to monitor for drug toxicity.  She has not had any recent infections.  Arthralgia of left acromioclavicular joint: She has tenderness of the left AC joint on exam.  She has discomfort with abduction but has good range of motion overall.  She is been experiencing increased pain for the past 2 weeks.  The discomfort has not been interfering with her ADLs.  She declined an x-ray and a cortisone injection today.  She was advised to notify us if her symptoms persist or worsen.  She was given a handout of shoulder exercises to perform.  She was also advised to use voltaren gel topically as needed for pain relief.  Primary osteoarthritis of both hands: She has DIP synovial thickening consistent with osteoarthritis of both hands.  She has a mucinous cyst on the right second DIP.  She has no synovitis or tenderness at this time.  She has complete fist formation bilaterally.  Joint protection and muscle strengthening were discussed.  Primary osteoarthritis of both feet: She has no discomfort in her feet at this time. She wears proper fitting shoes.   Trochanteric bursitis of both hips: Resolved   Medial epicondylitis of right elbow: Resolved   Other fatigue: She has chronic fatigue related to insomnia.   Other medical conditions are listed as follows:   History of hypertension  History of allergic rhinitis  History of anxiety  History of gastroesophageal reflux (GERD)  Former smoker  Orders: No orders of the defined types were placed in this encounter.  No orders of the defined types were placed in this encounter.     Follow-Up Instructions: Return in about 5 months (around 11/21/2019) for Rheumatoid arthritis, Osteoarthritis.    Ofilia Neas, PA-C   I examined and evaluated the patient with Hazel Sams PA.  Patient had no synovitis on examination.  She is doing well on Orencia.  Her labs are stable.  The plan of care was discussed as noted above.  Bo Merino, MD  Note - This record has been created using Editor, commissioning.  Chart creation errors have been sought, but may not always  have been located. Such creation errors do not reflect on  the standard of  medical care.

## 2019-06-11 ENCOUNTER — Other Ambulatory Visit: Payer: Self-pay | Admitting: Rheumatology

## 2019-06-12 NOTE — Telephone Encounter (Signed)
Last Visit: 02/02/19 Next visit: 06/23/19 Labs: 02/02/19 WNL TB Gold: 11/13/18 neg   Patient advised she is due to update labs. Patient will update next week.   Okay to refill 30 day supply per Dr. Estanislado Pandy

## 2019-06-17 ENCOUNTER — Other Ambulatory Visit: Payer: Self-pay

## 2019-06-17 DIAGNOSIS — Z79899 Other long term (current) drug therapy: Secondary | ICD-10-CM

## 2019-06-18 LAB — COMPLETE METABOLIC PANEL WITH GFR
AG Ratio: 1.8 (calc) (ref 1.0–2.5)
ALT: 12 U/L (ref 6–29)
AST: 15 U/L (ref 10–35)
Albumin: 4.2 g/dL (ref 3.6–5.1)
Alkaline phosphatase (APISO): 64 U/L (ref 37–153)
BUN: 14 mg/dL (ref 7–25)
CO2: 27 mmol/L (ref 20–32)
Calcium: 9.4 mg/dL (ref 8.6–10.4)
Chloride: 101 mmol/L (ref 98–110)
Creat: 0.81 mg/dL (ref 0.50–1.05)
GFR, Est African American: 92 mL/min/{1.73_m2} (ref >=60)
GFR, Est Non African American: 79 mL/min/{1.73_m2} (ref >=60)
Globulin: 2.3 g/dL (calc) (ref 1.9–3.7)
Glucose, Bld: 94 mg/dL (ref 65–99)
Potassium: 4.3 mmol/L (ref 3.5–5.3)
Sodium: 136 mmol/L (ref 135–146)
Total Bilirubin: 0.3 mg/dL (ref 0.2–1.2)
Total Protein: 6.5 g/dL (ref 6.1–8.1)

## 2019-06-18 LAB — CBC WITH DIFFERENTIAL/PLATELET
Absolute Monocytes: 689 cells/uL (ref 200–950)
Basophils Absolute: 78 cells/uL (ref 0–200)
Basophils Relative: 0.8 %
Eosinophils Absolute: 204 cells/uL (ref 15–500)
Eosinophils Relative: 2.1 %
HCT: 38.5 % (ref 35.0–45.0)
Hemoglobin: 12.9 g/dL (ref 11.7–15.5)
Lymphs Abs: 4093 cells/uL — ABNORMAL HIGH (ref 850–3900)
MCH: 31.1 pg (ref 27.0–33.0)
MCHC: 33.5 g/dL (ref 32.0–36.0)
MCV: 92.8 fL (ref 80.0–100.0)
MPV: 11.4 fL (ref 7.5–12.5)
Monocytes Relative: 7.1 %
Neutro Abs: 4637 cells/uL (ref 1500–7800)
Neutrophils Relative %: 47.8 %
Platelets: 225 10*3/uL (ref 140–400)
RBC: 4.15 10*6/uL (ref 3.80–5.10)
RDW: 12.5 % (ref 11.0–15.0)
Total Lymphocyte: 42.2 %
WBC: 9.7 10*3/uL (ref 3.8–10.8)

## 2019-06-18 NOTE — Progress Notes (Signed)
labs are stable

## 2019-06-23 ENCOUNTER — Ambulatory Visit (INDEPENDENT_AMBULATORY_CARE_PROVIDER_SITE_OTHER): Payer: Medicare Other | Admitting: Rheumatology

## 2019-06-23 ENCOUNTER — Other Ambulatory Visit: Payer: Self-pay

## 2019-06-23 ENCOUNTER — Encounter: Payer: Self-pay | Admitting: Rheumatology

## 2019-06-23 VITALS — BP 112/73 | HR 76 | Resp 14 | Ht 67.5 in | Wt 171.4 lb

## 2019-06-23 DIAGNOSIS — M7061 Trochanteric bursitis, right hip: Secondary | ICD-10-CM

## 2019-06-23 DIAGNOSIS — Z8709 Personal history of other diseases of the respiratory system: Secondary | ICD-10-CM

## 2019-06-23 DIAGNOSIS — Z8679 Personal history of other diseases of the circulatory system: Secondary | ICD-10-CM

## 2019-06-23 DIAGNOSIS — M0579 Rheumatoid arthritis with rheumatoid factor of multiple sites without organ or systems involvement: Secondary | ICD-10-CM

## 2019-06-23 DIAGNOSIS — M19072 Primary osteoarthritis, left ankle and foot: Secondary | ICD-10-CM

## 2019-06-23 DIAGNOSIS — Z79899 Other long term (current) drug therapy: Secondary | ICD-10-CM

## 2019-06-23 DIAGNOSIS — M19041 Primary osteoarthritis, right hand: Secondary | ICD-10-CM

## 2019-06-23 DIAGNOSIS — M7062 Trochanteric bursitis, left hip: Secondary | ICD-10-CM

## 2019-06-23 DIAGNOSIS — M19042 Primary osteoarthritis, left hand: Secondary | ICD-10-CM

## 2019-06-23 DIAGNOSIS — M19071 Primary osteoarthritis, right ankle and foot: Secondary | ICD-10-CM

## 2019-06-23 DIAGNOSIS — Z8659 Personal history of other mental and behavioral disorders: Secondary | ICD-10-CM

## 2019-06-23 DIAGNOSIS — M7701 Medial epicondylitis, right elbow: Secondary | ICD-10-CM

## 2019-06-23 DIAGNOSIS — R5383 Other fatigue: Secondary | ICD-10-CM

## 2019-06-23 DIAGNOSIS — M25512 Pain in left shoulder: Secondary | ICD-10-CM

## 2019-06-23 DIAGNOSIS — Z87891 Personal history of nicotine dependence: Secondary | ICD-10-CM

## 2019-06-23 DIAGNOSIS — Z8719 Personal history of other diseases of the digestive system: Secondary | ICD-10-CM

## 2019-06-23 NOTE — Patient Instructions (Addendum)
Standing Labs We placed an order today for your standing lab work.    Please come back and get your standing labs in December and every 3 months   We have open lab daily Monday through Thursday from 8:30-12:30 PM and 1:30-4:30 PM and Friday from 8:30-12:30 PM and 1:30-4:00 PM at the office of Dr. Bo Merino.   You may experience shorter wait times on Monday and Friday afternoons. The office is located at [address], [place], [place], [place] [address] No appointment is necessary.   Labs are drawn by [facility].  You may receive a bill from [facility] for your lab work.  If you wish to have your labs drawn at another location, please call the office 24 hours in advance to send orders.  If you have any questions regarding directions or hours of operation,  please call [phone].   Just as a reminder please drink plenty of water prior to coming for your lab work. Thanks!   Shoulder Exercises Ask your health care provider which exercises are safe for you. Do exercises exactly as told by your health care provider and adjust them as directed. It is normal to feel mild stretching, pulling, tightness, or discomfort as you do these exercises. Stop right away if you feel sudden pain or your pain gets worse. Do not begin these exercises until told by your health care provider. Stretching exercises External rotation and abduction This exercise is sometimes called corner stretch. This exercise rotates your arm outward (external rotation) and moves your arm out from your body (abduction). 1. Stand in a doorway with one of your feet slightly in front of the other. This is called a staggered stance. If you cannot reach your forearms to the door frame, stand facing a corner of a room. 2. Choose one of the following positions as told by your health care provider: ? Place your hands and forearms on the door frame above your head. ? Place your hands and forearms on the door frame at the height of  your head. ? Place your hands on the door frame at the height of your elbows. 3. Slowly move your weight onto your front foot until you feel a stretch across your chest and in the front of your shoulders. Keep your head and chest upright and keep your abdominal muscles tight. 4. Hold for __________ seconds. 5. To release the stretch, shift your weight to your back foot. Repeat __________ times. Complete this exercise __________ times a day. Extension, standing 1. Stand and hold a broomstick, a cane, or a similar object behind your back. ? Your hands should be a little wider than shoulder width apart. ? Your palms should face away from your back. 2. Keeping your elbows straight and your shoulder muscles relaxed, move the stick away from your body until you feel a stretch in your shoulders (extension). ? Avoid shrugging your shoulders while you move the stick. Keep your shoulder blades tucked down toward the middle of your back. 3. Hold for __________ seconds. 4. Slowly return to the starting position. Repeat __________ times. Complete this exercise __________ times a day. Range-of-motion exercises Pendulum  1. Stand near a wall or a surface that you can hold onto for balance. 2. Bend at the waist and let your left / right arm hang straight down. Use your other arm to support you. Keep your back straight and do not lock your knees. 3. Relax your left / right arm and shoulder muscles, and move your  hips and your trunk so your left / right arm swings freely. Your arm should swing because of the motion of your body, not because you are using your arm or shoulder muscles. 4. Keep moving your hips and trunk so your arm swings in the following directions, as told by your health care provider: ? Side to side. ? Forward and backward. ? In clockwise and counterclockwise circles. 5. Continue each motion for __________ seconds, or for as long as told by your health care provider. 6. Slowly return to the  starting position. Repeat __________ times. Complete this exercise __________ times a day. Shoulder flexion, standing  1. Stand and hold a broomstick, a cane, or a similar object. Place your hands a little more than shoulder width apart on the object. Your left / right hand should be palm up, and your other hand should be palm down. 2. Keep your elbow straight and your shoulder muscles relaxed. Push the stick up with your healthy arm to raise your left / right arm in front of your body, and then over your head until you feel a stretch in your shoulder (flexion). ? Avoid shrugging your shoulder while you raise your arm. Keep your shoulder blade tucked down toward the middle of your back. 3. Hold for __________ seconds. 4. Slowly return to the starting position. Repeat __________ times. Complete this exercise __________ times a day. Shoulder abduction, standing 1. Stand and hold a broomstick, a cane, or a similar object. Place your hands a little more than shoulder width apart on the object. Your left / right hand should be palm up, and your other hand should be palm down. 2. Keep your elbow straight and your shoulder muscles relaxed. Push the object across your body toward your left / right side. Raise your left / right arm to the side of your body (abduction) until you feel a stretch in your shoulder. ? Do not raise your arm above shoulder height unless your health care provider tells you to do that. ? If directed, raise your arm over your head. ? Avoid shrugging your shoulder while you raise your arm. Keep your shoulder blade tucked down toward the middle of your back. 3. Hold for __________ seconds. 4. Slowly return to the starting position. Repeat __________ times. Complete this exercise __________ times a day. Internal rotation  1. Place your left / right hand behind your back, palm up. 2. Use your other hand to dangle an exercise band, a towel, or a similar object over your shoulder. Grasp  the band with your left / right hand so you are holding on to both ends. 3. Gently pull up on the band until you feel a stretch in the front of your left / right shoulder. The movement of your arm toward the center of your body is called internal rotation. ? Avoid shrugging your shoulder while you raise your arm. Keep your shoulder blade tucked down toward the middle of your back. 4. Hold for __________ seconds. 5. Release the stretch by letting go of the band and lowering your hands. Repeat __________ times. Complete this exercise __________ times a day. Strengthening exercises External rotation  1. Sit in a stable chair without armrests. 2. Secure an exercise band to a stable object at elbow height on your left / right side. 3. Place a soft object, such as a folded towel or a small pillow, between your left / right upper arm and your body to move your elbow about 4 inches (  10 cm) away from your side. 4. Hold the end of the exercise band so it is tight and there is no slack. 5. Keeping your elbow pressed against the soft object, slowly move your forearm out, away from your abdomen (external rotation). Keep your body steady so only your forearm moves. 6. Hold for __________ seconds. 7. Slowly return to the starting position. Repeat __________ times. Complete this exercise __________ times a day. Shoulder abduction  1. Sit in a stable chair without armrests, or stand up. 2. Hold a __________ weight in your left / right hand, or hold an exercise band with both hands. 3. Start with your arms straight down and your left / right palm facing in, toward your body. 4. Slowly lift your left / right hand out to your side (abduction). Do not lift your hand above shoulder height unless your health care provider tells you that this is safe. ? Keep your arms straight. ? Avoid shrugging your shoulder while you do this movement. Keep your shoulder blade tucked down toward the middle of your back. 5. Hold  for __________ seconds. 6. Slowly lower your arm, and return to the starting position. Repeat __________ times. Complete this exercise __________ times a day. Shoulder extension 1. Sit in a stable chair without armrests, or stand up. 2. Secure an exercise band to a stable object in front of you so it is at shoulder height. 3. Hold one end of the exercise band in each hand. Your palms should face each other. 4. Straighten your elbows and lift your hands up to shoulder height. 5. Step back, away from the secured end of the exercise band, until the band is tight and there is no slack. 6. Squeeze your shoulder blades together as you pull your hands down to the sides of your thighs (extension). Stop when your hands are straight down by your sides. Do not let your hands go behind your body. 7. Hold for __________ seconds. 8. Slowly return to the starting position. Repeat __________ times. Complete this exercise __________ times a day. Shoulder row 1. Sit in a stable chair without armrests, or stand up. 2. Secure an exercise band to a stable object in front of you so it is at waist height. 3. Hold one end of the exercise band in each hand. Position your palms so that your thumbs are facing the ceiling (neutral position). 4. Bend each of your elbows to a 90-degree angle (right angle) and keep your upper arms at your sides. 5. Step back until the band is tight and there is no slack. 6. Slowly pull your elbows back behind you. 7. Hold for __________ seconds. 8. Slowly return to the starting position. Repeat __________ times. Complete this exercise __________ times a day. Shoulder press-ups  1. Sit in a stable chair that has armrests. Sit upright, with your feet flat on the floor. 2. Put your hands on the armrests so your elbows are bent and your fingers are pointing forward. Your hands should be about even with the sides of your body. 3. Push down on the armrests and use your arms to lift yourself  off the chair. Straighten your elbows and lift yourself up as much as you comfortably can. ? Move your shoulder blades down, and avoid letting your shoulders move up toward your ears. ? Keep your feet on the ground. As you get stronger, your feet should support less of your body weight as you lift yourself up. 4. Hold for __________ seconds.  5. Slowly lower yourself back into the chair. Repeat __________ times. Complete this exercise __________ times a day. Wall push-ups  1. Stand so you are facing a stable wall. Your feet should be about one arm-length away from the wall. 2. Lean forward and place your palms on the wall at shoulder height. 3. Keep your feet flat on the floor as you bend your elbows and lean forward toward the wall. 4. Hold for __________ seconds. 5. Straighten your elbows to push yourself back to the starting position. Repeat __________ times. Complete this exercise __________ times a day. This information is not intended to replace advice given to you by your health care provider. Make sure you discuss any questions you have with your health care provider. Document Released: 07/18/2005 Document Revised: 12/26/2018 Document Reviewed: 10/03/2018 Elsevier Patient Education  2020 [employer].

## 2019-07-09 ENCOUNTER — Other Ambulatory Visit: Payer: Self-pay | Admitting: Rheumatology

## 2019-07-10 NOTE — Telephone Encounter (Signed)
Last Visit: 06/23/19 Next Visit: 11/19/19 Labs: 06/17/19 stable TB Gold: 11/13/18 Neg   Okay to refill per Dr. Estanislado Pandy

## 2019-07-27 ENCOUNTER — Telehealth: Payer: Self-pay | Admitting: Pharmacy Technician

## 2019-07-27 NOTE — Telephone Encounter (Signed)
Mailed patient BMS renewal application for Orencia. Will fax once patient completes and returns.  12:10 PM Beatriz Chancellor, CPhT

## 2019-08-17 DIAGNOSIS — I10 Essential (primary) hypertension: Secondary | ICD-10-CM | POA: Diagnosis not present

## 2019-08-17 DIAGNOSIS — E7849 Other hyperlipidemia: Secondary | ICD-10-CM | POA: Diagnosis not present

## 2019-08-17 DIAGNOSIS — M0689 Other specified rheumatoid arthritis, multiple sites: Secondary | ICD-10-CM | POA: Diagnosis not present

## 2019-08-27 ENCOUNTER — Telehealth: Payer: Self-pay | Admitting: Rheumatology

## 2019-08-27 NOTE — Telephone Encounter (Signed)
They received the practices' form, but have not received patient form. Is patient sending in separately? Please call to advise. (call back number is in callers portion of message)

## 2019-08-27 NOTE — Telephone Encounter (Signed)
Yes, Patient portion was mailed to patient to complete and submit to BMS directly.

## 2019-09-17 DIAGNOSIS — E7849 Other hyperlipidemia: Secondary | ICD-10-CM | POA: Diagnosis not present

## 2019-09-17 DIAGNOSIS — I10 Essential (primary) hypertension: Secondary | ICD-10-CM | POA: Diagnosis not present

## 2019-09-17 DIAGNOSIS — M0689 Other specified rheumatoid arthritis, multiple sites: Secondary | ICD-10-CM | POA: Diagnosis not present

## 2019-09-29 ENCOUNTER — Ambulatory Visit: Payer: Medicare Other | Attending: Internal Medicine

## 2019-09-29 ENCOUNTER — Other Ambulatory Visit: Payer: Self-pay

## 2019-09-29 DIAGNOSIS — Z20822 Contact with and (suspected) exposure to covid-19: Secondary | ICD-10-CM

## 2019-10-01 ENCOUNTER — Other Ambulatory Visit: Payer: Self-pay | Admitting: *Deleted

## 2019-10-01 DIAGNOSIS — Z79899 Other long term (current) drug therapy: Secondary | ICD-10-CM

## 2019-10-01 LAB — NOVEL CORONAVIRUS, NAA: SARS-CoV-2, NAA: NOT DETECTED

## 2019-10-01 MED ORDER — ORENCIA CLICKJECT 125 MG/ML ~~LOC~~ SOAJ: 125.0000 mg | SUBCUTANEOUS | 0 refills | Status: DC

## 2019-10-01 NOTE — Telephone Encounter (Signed)
Last Visit: 06/23/19 Next Visit: 11/19/19 Labs: 06/17/19 stable TB Gold: 11/13/18 Neg   Patient advised she is due to update her labs and will update on Monday or Tuesday next week.   Okay to refill 30 day supply per Dr. Estanislado Pandy

## 2019-10-02 DIAGNOSIS — J329 Chronic sinusitis, unspecified: Secondary | ICD-10-CM | POA: Diagnosis not present

## 2019-10-12 DIAGNOSIS — E7849 Other hyperlipidemia: Secondary | ICD-10-CM | POA: Diagnosis not present

## 2019-10-12 DIAGNOSIS — Z1389 Encounter for screening for other disorder: Secondary | ICD-10-CM | POA: Diagnosis not present

## 2019-10-12 DIAGNOSIS — Z0000 Encounter for general adult medical examination without abnormal findings: Secondary | ICD-10-CM | POA: Diagnosis not present

## 2019-10-12 DIAGNOSIS — E559 Vitamin D deficiency, unspecified: Secondary | ICD-10-CM | POA: Diagnosis not present

## 2019-10-12 DIAGNOSIS — E782 Mixed hyperlipidemia: Secondary | ICD-10-CM | POA: Diagnosis not present

## 2019-10-18 DIAGNOSIS — I10 Essential (primary) hypertension: Secondary | ICD-10-CM | POA: Diagnosis not present

## 2019-10-18 DIAGNOSIS — M0689 Other specified rheumatoid arthritis, multiple sites: Secondary | ICD-10-CM | POA: Diagnosis not present

## 2019-10-18 DIAGNOSIS — E7849 Other hyperlipidemia: Secondary | ICD-10-CM | POA: Diagnosis not present

## 2019-10-20 ENCOUNTER — Telehealth: Payer: Self-pay | Admitting: Rheumatology

## 2019-10-20 NOTE — Telephone Encounter (Signed)
Patient called stating she had her labwork last week with her PCP.  Patient states they send their labwork through [facility] and is checking if we received a copy of the results.

## 2019-10-20 NOTE — Telephone Encounter (Signed)
Patient advised we have not received her lab results. Patient has correct fax number and will call PCP to have them re-fax the results.

## 2019-10-23 ENCOUNTER — Telehealth: Payer: Self-pay | Admitting: *Deleted

## 2019-10-23 NOTE — Telephone Encounter (Signed)
Labs received from [facility] drawn on 10/12/19 CBC/CMP/Cholesterol Panel/Vitamin D  Reviewed by Dr. Estanislado Pandy   Lymph # 3.8 Cholesterol 226 Triglycerides 242 VLDL 43 LDL 137 Vitamin D 26.4  Patient is on Orencia weekly.

## 2019-11-13 NOTE — Progress Notes (Deleted)
Office Visit Note  Patient: Samantha Yates             Date of Birth: 1961/02/13           MRN: [id number]             PCP: Sharilyn Sites, MD Referring: Sharilyn Sites, MD Visit Date: 11/19/2019 Occupation: @GUAROCC @  Subjective:  No chief complaint on file.   History of Present Illness: Samantha Yates is a 59 y.o. female ***   Activities of Daily Living:  Patient reports morning stiffness for *** {minute/hour:19697}.   Patient {ACTIONS;DENIES/REPORTS:21021675::"Denies"} nocturnal pain.  Difficulty dressing/grooming: {ACTIONS;DENIES/REPORTS:21021675::"Denies"} Difficulty climbing stairs: {ACTIONS;DENIES/REPORTS:21021675::"Denies"} Difficulty getting out of chair: {ACTIONS;DENIES/REPORTS:21021675::"Denies"} Difficulty using hands for taps, buttons, cutlery, and/or writing: {ACTIONS;DENIES/REPORTS:21021675::"Denies"}  No Rheumatology ROS completed.   PMFS History:  Patient Active Problem List   Diagnosis Date Noted  . Special screening for malignant neoplasms, colon   . High risk medication use 11/08/2016  . Primary osteoarthritis of both hands 11/08/2016  . Primary osteoarthritis of both feet 11/08/2016  . History of gastroesophageal reflux (GERD) 11/08/2016  . History of hypertension 11/08/2016  . History of anxiety 11/08/2016  . History of allergic rhinitis 11/08/2016  . Former smoker 11/08/2016  . GERD (gastroesophageal reflux disease) 09/17/2008  . Rheumatoid arthritis ([place]) 09/17/2006  . Anxiety 09/17/2006  . Hypertension 09/17/2005  . Allergic rhinitis 09/17/1996    Past Medical History:  Diagnosis Date  . Arthritis   . Hypertension   . Rheumatoid arthritis ([place])     Family History  Problem Relation Age of Onset  . Colon polyps Mother   . Diabetes Brother   . Diabetes Brother   . Hypertension Daughter   . Diabetes Daughter   . Healthy Son   . Colon cancer Cousin    Past Surgical History:  Procedure Laterality Date  . COLONOSCOPY N/A 04/09/2018     Procedure: COLONOSCOPY;  Surgeon: Danie Binder, MD;  [facility];  Service: Endoscopy;  Laterality: N/A;  9:30  . POLYPECTOMY  04/09/2018   Procedure: POLYPECTOMY;  Surgeon: Danie Binder, MD;  Location: [facility];  Service: Endoscopy;;  ascending, descending colon,rectal, transverse,  . 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   Immunization History  Administered Date(s) Administered  . Tdap 04/03/2015     Objective: Vital Signs: There were no vitals taken for this visit.   Physical Exam   Musculoskeletal Exam: ***  CDAI Exam: CDAI Score: -- Patient Global: --; Provider Global: -- Swollen: --; Tender: -- Joint Exam 11/19/2019   No joint exam has been documented for this visit   There is currently no information documented on the homunculus. Go to the Rheumatology activity and complete the homunculus joint exam.  Investigation: No additional findings.  Imaging: No results found.  Recent Labs: Lab Results  Component Value Date   WBC 9.7 06/17/2019   HGB 12.9 06/17/2019   PLT 225 06/17/2019   NA 136 06/17/2019   K 4.3 06/17/2019   CL 101 06/17/2019   CO2 27 06/17/2019   GLUCOSE 94 06/17/2019   BUN 14 06/17/2019   CREATININE 0.81 06/17/2019   BILITOT 0.3 06/17/2019   ALKPHOS 57 04/04/2017   AST 15 06/17/2019   ALT 12 06/17/2019   PROT 6.5 06/17/2019   ALBUMIN 4.0 04/04/2017   CALCIUM 9.4 06/17/2019   GFRAA 92 06/17/2019   QFTBGOLDPLUS NEGATIVE 11/13/2018  Speciality Comments: No specialty comments available.  Procedures:  No procedures performed Allergies: Sulfamethoxazole   Assessment / Plan:     Visit Diagnoses: No diagnosis found.  Orders: No orders of the defined types were placed in this encounter.  No orders of the defined types were placed in this encounter.   Face-to-face time spent with patient was *** minutes. Greater than 50% of time was  spent in counseling and coordination of care.  Follow-Up Instructions: No follow-ups on file.   Earnestine Mealing, CMA  Note - This record has been created using Editor, commissioning.  Chart creation errors have been sought, but may not always  have been located. Such creation errors do not reflect on  the standard of medical care.

## 2019-11-15 DIAGNOSIS — M0689 Other specified rheumatoid arthritis, multiple sites: Secondary | ICD-10-CM | POA: Diagnosis not present

## 2019-11-15 DIAGNOSIS — E7849 Other hyperlipidemia: Secondary | ICD-10-CM | POA: Diagnosis not present

## 2019-11-15 DIAGNOSIS — I10 Essential (primary) hypertension: Secondary | ICD-10-CM | POA: Diagnosis not present

## 2019-11-19 ENCOUNTER — Ambulatory Visit: Payer: Medicare Other | Admitting: Rheumatology

## 2019-11-21 ENCOUNTER — Ambulatory Visit: Payer: Medicare Other

## 2019-11-27 ENCOUNTER — Telehealth: Payer: Self-pay

## 2019-11-27 NOTE — Telephone Encounter (Signed)
Per initial encounter, "Pt has her first vaccine dose scheduled on 3/19. She was informed to stop take prednisone two weeks prior. She is have a rheumatoid arthritis flare and wants to know what she can take to be safe with the vaccine. Please call pt back to discuss. Thank you!"; contacted pt and explained to pt that she should contact the MD that prescribed prednisone regarding medication and taking COVID vaccine; she verbalized understanding.

## 2019-11-27 NOTE — Telephone Encounter (Signed)
Pt has her first vaccine dose scheduled on 3/19. She was informed to stop take prednisone two weeks prior. She is have a rheumatoid arthritis flare and wants to know what she can take to be safe with the vaccine. Will have a nurse call her back.   [place]

## 2019-11-30 NOTE — Progress Notes (Signed)
Office Visit Note  Patient: Samantha Yates             Date of Birth: 1961-08-20           MRN: [id number]             PCP: Sharilyn Sites, MD Referring: Sharilyn Sites, MD Visit Date: 12/09/2019 Occupation: @GUAROCC @  Subjective:  Left shoulder joint pain   History of Present Illness: Samantha Yates is a 59 y.o. female with history of seropositive rheumatoid arthritis and osteoarthritis.  Patient is on Orencia 125 mg subcutaneous injections once weekly.  She denies missing any doses of Orencia recently.  She states that 1 month ago she was started on vitamin D supplement by her PCP for vitamin D deficiency.  She states that she took vitamin D for 1 week and developed a flare in multiple joints.  She states that she was having swelling and discomfort in the right hand, right wrist, and left shoulder.  She continues to have persistent left shoulder discomfort.  She has been experiencing nocturnal pain and difficulty with range of motion.  She states that the discomfort seems to progressively been getting better but still has tenderness.  She has received her first covid 19 vaccine.   Activities of Daily Living:  Patient reports morning stiffness for 2-3 hours.   Patient Denies nocturnal pain.  Difficulty dressing/grooming: Reports Difficulty climbing stairs: Reports Difficulty getting out of chair: Reports Difficulty using hands for taps, buttons, cutlery, and/or writing: Reports  Review of Systems  Constitutional: Positive for fatigue.  HENT: Positive for mouth dryness. Negative for mouth sores and nose dryness.   Eyes: Positive for dryness. Negative for pain and visual disturbance.  Respiratory: Negative for cough, hemoptysis, shortness of breath and difficulty breathing.   Cardiovascular: Negative for chest pain, palpitations, hypertension and swelling in legs/feet.  Gastrointestinal: Negative for blood in stool, constipation and diarrhea.  Endocrine: Negative for increased  urination.  Genitourinary: Negative for difficulty urinating and painful urination.  Musculoskeletal: Positive for arthralgias, joint pain, joint swelling and morning stiffness. Negative for myalgias, muscle weakness, muscle tenderness and myalgias.  Skin: Negative for color change, pallor, rash, hair loss, nodules/bumps, redness, skin tightness, ulcers and sensitivity to sunlight.  Allergic/Immunologic: Negative for susceptible to infections.  Neurological: Positive for headaches. Negative for dizziness, numbness, memory loss and weakness.  Hematological: Negative for swollen glands.  Psychiatric/Behavioral: Positive for sleep disturbance. Negative for depressed mood and confusion. The patient is not nervous/anxious.     PMFS History:  Patient Active Problem List   Diagnosis Date Noted  . Special screening for malignant neoplasms, colon   . High risk medication use 11/08/2016  . Primary osteoarthritis of both hands 11/08/2016  . Primary osteoarthritis of both feet 11/08/2016  . History of gastroesophageal reflux (GERD) 11/08/2016  . History of hypertension 11/08/2016  . History of anxiety 11/08/2016  . History of allergic rhinitis 11/08/2016  . Former smoker 11/08/2016  . GERD (gastroesophageal reflux disease) 09/17/2008  . Rheumatoid arthritis ([place]) 09/17/2006  . Anxiety 09/17/2006  . Hypertension 09/17/2005  . Allergic rhinitis 09/17/1996    Past Medical History:  Diagnosis Date  . Arthritis   . Hypertension   . Rheumatoid arthritis ([place])     Family History  Problem Relation Age of Onset  . Colon polyps Mother   . Diabetes Brother   . Diabetes Brother   . Hypertension Daughter   . Diabetes Daughter   . Healthy Son   .  Colon cancer Cousin    Past Surgical History:  Procedure Laterality Date  . COLONOSCOPY N/A 04/09/2018   Procedure: COLONOSCOPY;  Surgeon: Danie Binder, MD;  [facility];  Service: Endoscopy;  Laterality: N/A;  9:30  . POLYPECTOMY   04/09/2018   Procedure: POLYPECTOMY;  Surgeon: Danie Binder, MD;  [facility];  Service: Endoscopy;;  ascending, descending colon,rectal, transverse,  . 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   Immunization History  Administered Date(s) Administered  . Moderna SARS-COVID-2 Vaccination 12/04/2019  . Tdap 04/03/2015     Objective: Vital Signs: BP 102/66 (BP Location: Left Arm, Patient Position: Sitting, Cuff Size: Normal)   Pulse 90   Resp 14   Ht 5' 7.5" (1.715 m)   Wt 181 lb 12.8 oz (82.5 kg)   BMI 28.05 kg/m    Physical Exam Vitals and nursing note reviewed.  Constitutional:      Appearance: She is well-developed.  HENT:     Head: Normocephalic and atraumatic.  Eyes:     Conjunctiva/sclera: Conjunctivae normal.  Pulmonary:     Effort: Pulmonary effort is normal.  Abdominal:     General: Bowel sounds are normal.     Palpations: Abdomen is soft.  Musculoskeletal:     Cervical back: Normal range of motion.  Lymphadenopathy:     Cervical: No cervical adenopathy.  Skin:    General: Skin is warm and dry.     Capillary Refill: Capillary refill takes less than 2 seconds.  Neurological:     Mental Status: She is alert and oriented to person, place, and time.  Psychiatric:        Behavior: Behavior normal.      Musculoskeletal Exam: C-spine limited lateral rotation.  Good flexion and extension of C-spine.  Thoracic and lumbar spine good ROM.  Left shoulder positive painful arc.  Tenderness over the left AC joint and subacromial bursa.  Right shoulder has full ROM with no discomfort.  Elbow joints good ROM.  Mild tenderness and inflammation over the ulnar aspect of the right wrist joint.  Left wrist has good ROM with no tenderness or inflammation.  MCPs, PIPs, and DIPs good ROM with no synovitis.  Hip joints, knee joints, ankle joints, MTPs, PIPs, and DIPs good ROM with no synovitis.  No  warmth or effusion of knee joints.  No tenderness or swelling of ankle joints.    CDAI Exam: CDAI Score: -- Patient Global: --; Provider Global: -- Swollen: 0 ; Tender: 2  Joint Exam 12/09/2019      Right  Left  Glenohumeral      Tender  Wrist   Tender        Investigation: No additional findings.  Imaging: XR Shoulder Left  Result Date: 12/09/2019 No glenohumeral joint space narrowing was noted.  Acromioclavicular arthritis was noted.  No chondrocalcinosis was noted. Impression: These findings are consistent with acromioclavicular arthritis.   Recent Labs: Lab Results  Component Value Date   WBC 9.7 06/17/2019   HGB 12.9 06/17/2019   PLT 225 06/17/2019   NA 136 06/17/2019   K 4.3 06/17/2019   CL 101 06/17/2019   CO2 27 06/17/2019   GLUCOSE 94 06/17/2019   BUN 14 06/17/2019   CREATININE 0.81 06/17/2019   BILITOT 0.3 06/17/2019   ALKPHOS 57 04/04/2017   AST 15 06/17/2019   ALT 12 06/17/2019   PROT 6.5  06/17/2019   ALBUMIN 4.0 04/04/2017   CALCIUM 9.4 06/17/2019   GFRAA 92 06/17/2019   QFTBGOLDPLUS NEGATIVE 11/13/2018    Speciality Comments: No specialty comments available.  Procedures:  Large Joint Inj: L subacromial bursa on 12/09/2019 11:46 AM Indications: pain Details: 27 G 1.5 in needle, posterior approach  Arthrogram: No  Medications: 40 mg triamcinolone acetonide 40 MG/ML; 1 mL lidocaine 1 % Aspirate: 0 mL Outcome: tolerated well, no immediate complications Procedure, treatment alternatives, risks and benefits explained, specific risks discussed. Consent was given by the patient. Immediately prior to procedure a time out was called to verify the correct patient, procedure, equipment, support staff and site/side marked as required. Patient was prepped and draped in the usual sterile fashion.     Allergies: Sulfamethoxazole   Assessment / Plan:     Visit Diagnoses: Rheumatoid arthritis involving multiple sites with positive rheumatoid factor (HCC) -  +RF,+anti-CCP with nodulosis: She has tenderness and mild inflammation over the ulnar aspect of the right wrist.  She presents today with left shoulder joint pain, which started 1 month ago.  She has been experiencing nocturnal pain and has positive painful arc on exam.  X-rays of the left shoulder were obtained today, which revealed AC joint arthritis.  She requested a left subacromial cortisone injection.  She tolerated the procedure well.  The procedure note was completed above.  According to the patient she had a flare 1 month ago in the left shoulder, right wrist, and right hand, which she attributes to taking vitamin D supplement prescribed by her PCP.  She discontinued the supplement after 1 week and her symptoms improved.  She is on Orencia 125 mg subcutaneous injections once weekly.  She has not missed any doses of Orencia recently.  She states overall Orencia has been effective and she has not had increase in frequency of flares.  She will continue on Orencia as prescribed.  She is advised to notify us if she develops increased joint pain or joint swelling.  She will follow-up in the office in 5 months.  High risk medication use - Orencia Clickject 0000000 mg every 7 days.  CBC and CMP were drawn on 10/12/2019.  TB gold negative on 11/13/2018.  CBC, CMP, and TB gold were drawn today.  She has received her first COVID-19 vaccination and has her second vaccine scheduled.- Plan: COMPLETE METABOLIC PANEL WITH GFR, CBC with Differential/Platelet, QuantiFERON-TB Gold Plus  Primary osteoarthritis of both hands: She experiences intermittent discomfort in the right hand.  She has no synovitis on exam today.  Joint protection and muscle strengthening were discussed.  Primary osteoarthritis of both feet: She experiences intermittent discomfort in both feet.  She wears proper fitting shoes.  Trochanteric bursitis of both hips - Resolved   Medial epicondylitis of right elbow - She has tenderness to palpation on  exam.   Other fatigue: Chronic but stable.  Acute pain of left shoulder -She presents today with left shoulder discomfort.  She has been experiencing left shoulder pain for the past 1 month.  She has not had any injuries or overuse activities.  She has been experiencing nocturnal pain.  X-rays of the left shoulder were obtained today which revealed AC joint arthritis.  A left subacromial cortisone injection was performed today.  Procedure note was completed above.  Aftercare was discussed.  Plan: XR Shoulder Left  Other medical conditions are listed as follows:   History of hypertension  History of anxiety  History of gastroesophageal reflux (GERD)  History of allergic rhinitis  Former smoker    Orders: Orders Placed This Encounter  Procedures  . Large Joint Inj  . XR Shoulder Left  . COMPLETE METABOLIC PANEL WITH GFR  . CBC with Differential/Platelet  . QuantiFERON-TB Gold Plus   No orders of the defined types were placed in this encounter.   Face-to-face time spent with patient was 30 minutes. Greater than 50% of time was spent in counseling and coordination of care.  Follow-Up Instructions: Return in about 5 months (around 05/10/2020) for Rheumatoid arthritis.   Ofilia Neas, PA-C  Note - This record has been created using Dragon software.  Chart creation errors have been sought, but may not always  have been located. Such creation errors do not reflect on  the standard of medical care.

## 2019-12-04 ENCOUNTER — Ambulatory Visit: Payer: Medicare Other | Attending: Internal Medicine

## 2019-12-04 DIAGNOSIS — Z23 Encounter for immunization: Secondary | ICD-10-CM

## 2019-12-04 NOTE — Progress Notes (Signed)
   Covid-19 Vaccination Clinic  Name:  Samantha Yates    MRN: [id number] DOB: 1961-08-11  12/04/2019  [name] was observed post Covid-19 immunization for 15 minutes without incident. She was provided with Vaccine Information Sheet and instruction to access the V-Safe system.   [name] was instructed to call 911 with any severe reactions post vaccine: Marland Kitchen Difficulty breathing  . Swelling of face and throat  . A fast heartbeat  . A bad rash all over body  . Dizziness and weakness   Immunizations Administered    Name Date Dose VIS Date Route   Moderna COVID-19 Vaccine 12/04/2019  9:13 AM 0.5 mL 08/18/2019 Intramuscular   Manufacturer: Moderna   Lot: [id number]   [id number]

## 2019-12-09 ENCOUNTER — Ambulatory Visit: Payer: Medicare Other | Admitting: Physician Assistant

## 2019-12-09 ENCOUNTER — Encounter: Payer: Self-pay | Admitting: Physician Assistant

## 2019-12-09 ENCOUNTER — Ambulatory Visit: Payer: Self-pay

## 2019-12-09 ENCOUNTER — Other Ambulatory Visit: Payer: Self-pay

## 2019-12-09 VITALS — BP 102/66 | HR 90 | Resp 14 | Ht 67.5 in | Wt 181.8 lb

## 2019-12-09 DIAGNOSIS — M19041 Primary osteoarthritis, right hand: Secondary | ICD-10-CM

## 2019-12-09 DIAGNOSIS — M0579 Rheumatoid arthritis with rheumatoid factor of multiple sites without organ or systems involvement: Secondary | ICD-10-CM

## 2019-12-09 DIAGNOSIS — M19071 Primary osteoarthritis, right ankle and foot: Secondary | ICD-10-CM

## 2019-12-09 DIAGNOSIS — M7061 Trochanteric bursitis, right hip: Secondary | ICD-10-CM | POA: Diagnosis not present

## 2019-12-09 DIAGNOSIS — Z8709 Personal history of other diseases of the respiratory system: Secondary | ICD-10-CM

## 2019-12-09 DIAGNOSIS — Z8719 Personal history of other diseases of the digestive system: Secondary | ICD-10-CM

## 2019-12-09 DIAGNOSIS — Z79899 Other long term (current) drug therapy: Secondary | ICD-10-CM | POA: Diagnosis not present

## 2019-12-09 DIAGNOSIS — M25512 Pain in left shoulder: Secondary | ICD-10-CM | POA: Diagnosis not present

## 2019-12-09 DIAGNOSIS — Z87891 Personal history of nicotine dependence: Secondary | ICD-10-CM

## 2019-12-09 DIAGNOSIS — Z8659 Personal history of other mental and behavioral disorders: Secondary | ICD-10-CM

## 2019-12-09 DIAGNOSIS — M7701 Medial epicondylitis, right elbow: Secondary | ICD-10-CM

## 2019-12-09 DIAGNOSIS — R5383 Other fatigue: Secondary | ICD-10-CM

## 2019-12-09 DIAGNOSIS — M7062 Trochanteric bursitis, left hip: Secondary | ICD-10-CM

## 2019-12-09 DIAGNOSIS — M19072 Primary osteoarthritis, left ankle and foot: Secondary | ICD-10-CM

## 2019-12-09 DIAGNOSIS — M19042 Primary osteoarthritis, left hand: Secondary | ICD-10-CM

## 2019-12-09 DIAGNOSIS — Z8679 Personal history of other diseases of the circulatory system: Secondary | ICD-10-CM

## 2019-12-09 NOTE — Patient Instructions (Signed)
Shoulder Exercises Ask your health care provider which exercises are safe for you. Do exercises exactly as told by your health care provider and adjust them as directed. It is normal to feel mild stretching, pulling, tightness, or discomfort as you do these exercises. Stop right away if you feel sudden pain or your pain gets worse. Do not begin these exercises until told by your health care provider. Stretching exercises External rotation and abduction This exercise is sometimes called corner stretch. This exercise rotates your arm outward (external rotation) and moves your arm out from your body (abduction). 1. Stand in a doorway with one of your feet slightly in front of the other. This is called a staggered stance. If you cannot reach your forearms to the door frame, stand facing a corner of a room. 2. Choose one of the following positions as told by your health care provider: ? Place your hands and forearms on the door frame above your head. ? Place your hands and forearms on the door frame at the height of your head. ? Place your hands on the door frame at the height of your elbows. 3. Slowly move your weight onto your front foot until you feel a stretch across your chest and in the front of your shoulders. Keep your head and chest upright and keep your abdominal muscles tight. 4. Hold for __________ seconds. 5. To release the stretch, shift your weight to your back foot. Repeat __________ times. Complete this exercise __________ times a day. Extension, standing 1. Stand and hold a broomstick, a cane, or a similar object behind your back. ? Your hands should be a little wider than shoulder width apart. ? Your palms should face away from your back. 2. Keeping your elbows straight and your shoulder muscles relaxed, move the stick away from your body until you feel a stretch in your shoulders (extension). ? Avoid shrugging your shoulders while you move the stick. Keep your shoulder blades tucked  down toward the middle of your back. 3. Hold for __________ seconds. 4. Slowly return to the starting position. Repeat __________ times. Complete this exercise __________ times a day. Range-of-motion exercises Pendulum  1. Stand near a wall or a surface that you can hold onto for balance. 2. Bend at the waist and let your left / right arm hang straight down. Use your other arm to support you. Keep your back straight and do not lock your knees. 3. Relax your left / right arm and shoulder muscles, and move your hips and your trunk so your left / right arm swings freely. Your arm should swing because of the motion of your body, not because you are using your arm or shoulder muscles. 4. Keep moving your hips and trunk so your arm swings in the following directions, as told by your health care provider: ? Side to side. ? Forward and backward. ? In clockwise and counterclockwise circles. 5. Continue each motion for __________ seconds, or for as long as told by your health care provider. 6. Slowly return to the starting position. Repeat __________ times. Complete this exercise __________ times a day. Shoulder flexion, standing  1. Stand and hold a broomstick, a cane, or a similar object. Place your hands a little more than shoulder width apart on the object. Your left / right hand should be palm up, and your other hand should be palm down. 2. Keep your elbow straight and your shoulder muscles relaxed. Push the stick up with your healthy arm to   raise your left / right arm in front of your body, and then over your head until you feel a stretch in your shoulder (flexion). ? Avoid shrugging your shoulder while you raise your arm. Keep your shoulder blade tucked down toward the middle of your back. 3. Hold for __________ seconds. 4. Slowly return to the starting position. Repeat __________ times. Complete this exercise __________ times a day. Shoulder abduction, standing 1. Stand and hold a broomstick,  a cane, or a similar object. Place your hands a little more than shoulder width apart on the object. Your left / right hand should be palm up, and your other hand should be palm down. 2. Keep your elbow straight and your shoulder muscles relaxed. Push the object across your body toward your left / right side. Raise your left / right arm to the side of your body (abduction) until you feel a stretch in your shoulder. ? Do not raise your arm above shoulder height unless your health care provider tells you to do that. ? If directed, raise your arm over your head. ? Avoid shrugging your shoulder while you raise your arm. Keep your shoulder blade tucked down toward the middle of your back. 3. Hold for __________ seconds. 4. Slowly return to the starting position. Repeat __________ times. Complete this exercise __________ times a day. Internal rotation  1. Place your left / right hand behind your back, palm up. 2. Use your other hand to dangle an exercise band, a towel, or a similar object over your shoulder. Grasp the band with your left / right hand so you are holding on to both ends. 3. Gently pull up on the band until you feel a stretch in the front of your left / right shoulder. The movement of your arm toward the center of your body is called internal rotation. ? Avoid shrugging your shoulder while you raise your arm. Keep your shoulder blade tucked down toward the middle of your back. 4. Hold for __________ seconds. 5. Release the stretch by letting go of the band and lowering your hands. Repeat __________ times. Complete this exercise __________ times a day. Strengthening exercises External rotation  1. Sit in a stable chair without armrests. 2. Secure an exercise band to a stable object at elbow height on your left / right side. 3. Place a soft object, such as a folded towel or a small pillow, between your left / right upper arm and your body to move your elbow about 4 inches (10 cm) away  from your side. 4. Hold the end of the exercise band so it is tight and there is no slack. 5. Keeping your elbow pressed against the soft object, slowly move your forearm out, away from your abdomen (external rotation). Keep your body steady so only your forearm moves. 6. Hold for __________ seconds. 7. Slowly return to the starting position. Repeat __________ times. Complete this exercise __________ times a day. Shoulder abduction  1. Sit in a stable chair without armrests, or stand up. 2. Hold a __________ weight in your left / right hand, or hold an exercise band with both hands. 3. Start with your arms straight down and your left / right palm facing in, toward your body. 4. Slowly lift your left / right hand out to your side (abduction). Do not lift your hand above shoulder height unless your health care provider tells you that this is safe. ? Keep your arms straight. ? Avoid shrugging your shoulder while you   do this movement. Keep your shoulder blade tucked down toward the middle of your back. 5. Hold for __________ seconds. 6. Slowly lower your arm, and return to the starting position. Repeat __________ times. Complete this exercise __________ times a day. Shoulder extension 1. Sit in a stable chair without armrests, or stand up. 2. Secure an exercise band to a stable object in front of you so it is at shoulder height. 3. Hold one end of the exercise band in each hand. Your palms should face each other. 4. Straighten your elbows and lift your hands up to shoulder height. 5. Step back, away from the secured end of the exercise band, until the band is tight and there is no slack. 6. Squeeze your shoulder blades together as you pull your hands down to the sides of your thighs (extension). Stop when your hands are straight down by your sides. Do not let your hands go behind your body. 7. Hold for __________ seconds. 8. Slowly return to the starting position. Repeat __________ times.  Complete this exercise __________ times a day. Shoulder row 1. Sit in a stable chair without armrests, or stand up. 2. Secure an exercise band to a stable object in front of you so it is at waist height. 3. Hold one end of the exercise band in each hand. Position your palms so that your thumbs are facing the ceiling (neutral position). 4. Bend each of your elbows to a 90-degree angle (right angle) and keep your upper arms at your sides. 5. Step back until the band is tight and there is no slack. 6. Slowly pull your elbows back behind you. 7. Hold for __________ seconds. 8. Slowly return to the starting position. Repeat __________ times. Complete this exercise __________ times a day. Shoulder press-ups  1. Sit in a stable chair that has armrests. Sit upright, with your feet flat on the floor. 2. Put your hands on the armrests so your elbows are bent and your fingers are pointing forward. Your hands should be about even with the sides of your body. 3. Push down on the armrests and use your arms to lift yourself off the chair. Straighten your elbows and lift yourself up as much as you comfortably can. ? Move your shoulder blades down, and avoid letting your shoulders move up toward your ears. ? Keep your feet on the ground. As you get stronger, your feet should support less of your body weight as you lift yourself up. 4. Hold for __________ seconds. 5. Slowly lower yourself back into the chair. Repeat __________ times. Complete this exercise __________ times a day. Wall push-ups  1. Stand so you are facing a stable wall. Your feet should be about one arm-length away from the wall. 2. Lean forward and place your palms on the wall at shoulder height. 3. Keep your feet flat on the floor as you bend your elbows and lean forward toward the wall. 4. Hold for __________ seconds. 5. Straighten your elbows to push yourself back to the starting position. Repeat __________ times. Complete this exercise  __________ times a day. This information is not intended to replace advice given to you by your health care provider. Make sure you discuss any questions you have with your health care provider. Document Revised: 12/26/2018 Document Reviewed: 10/03/2018 Elsevier Patient Education  2020 [employer]  

## 2019-12-10 NOTE — Progress Notes (Signed)
Glucose is 106. Rest of CMP WNL.  CBC WNL

## 2019-12-11 LAB — CBC WITH DIFFERENTIAL/PLATELET
Absolute Monocytes: 501 cells/uL (ref 200–950)
Basophils Absolute: 39 cells/uL (ref 0–200)
Basophils Relative: 0.5 %
Eosinophils Absolute: 108 cells/uL (ref 15–500)
Eosinophils Relative: 1.4 %
HCT: 36.6 % (ref 35.0–45.0)
Hemoglobin: 12.2 g/dL (ref 11.7–15.5)
Lymphs Abs: 2526 cells/uL (ref 850–3900)
MCH: 31.2 pg (ref 27.0–33.0)
MCHC: 33.3 g/dL (ref 32.0–36.0)
MCV: 93.6 fL (ref 80.0–100.0)
MPV: 11.1 fL (ref 7.5–12.5)
Monocytes Relative: 6.5 %
Neutro Abs: 4528 cells/uL (ref 1500–7800)
Neutrophils Relative %: 58.8 %
Platelets: 255 10*3/uL (ref 140–400)
RBC: 3.91 10*6/uL (ref 3.80–5.10)
RDW: 12.4 % (ref 11.0–15.0)
Total Lymphocyte: 32.8 %
WBC: 7.7 10*3/uL (ref 3.8–10.8)

## 2019-12-11 LAB — QUANTIFERON-TB GOLD PLUS
Mitogen-NIL: >10 IU/mL
NIL: 0.09 IU/mL
QuantiFERON-TB Gold Plus: NEGATIVE
TB1-NIL: <0 IU/mL
TB2-NIL: <0 IU/mL

## 2019-12-11 LAB — COMPLETE METABOLIC PANEL WITH GFR
AG Ratio: 1.7 (calc) (ref 1.0–2.5)
ALT: 11 U/L (ref 6–29)
AST: 15 U/L (ref 10–35)
Albumin: 4 g/dL (ref 3.6–5.1)
Alkaline phosphatase (APISO): 72 U/L (ref 37–153)
BUN: 10 mg/dL (ref 7–25)
CO2: 26 mmol/L (ref 20–32)
Calcium: 9.1 mg/dL (ref 8.6–10.4)
Chloride: 105 mmol/L (ref 98–110)
Creat: 0.77 mg/dL (ref 0.50–0.99)
GFR, Est African American: 97 mL/min/{1.73_m2} (ref >=60)
GFR, Est Non African American: 84 mL/min/{1.73_m2} (ref >=60)
Globulin: 2.3 g/dL (calc) (ref 1.9–3.7)
Glucose, Bld: 106 mg/dL — ABNORMAL HIGH (ref 65–99)
Potassium: 4.4 mmol/L (ref 3.5–5.3)
Sodium: 139 mmol/L (ref 135–146)
Total Bilirubin: 0.3 mg/dL (ref 0.2–1.2)
Total Protein: 6.3 g/dL (ref 6.1–8.1)

## 2019-12-14 ENCOUNTER — Telehealth: Payer: Self-pay | Admitting: *Deleted

## 2019-12-14 NOTE — Progress Notes (Signed)
TB gold negative

## 2019-12-14 NOTE — Telephone Encounter (Signed)
Ok to resume [place] on 01/12/20.

## 2019-12-14 NOTE — Telephone Encounter (Signed)
Patient states she is due to have her Covid vaccine on 4/20/2. Patient states she will be due to take her Orencia on that day. Patient advised she should hold her Orencia 1 week before and 1 week after. Patient would like to know if she will be able to resume her Orencia on 01/12/20 as it will be exactly one week after Covid vaccine. Please advise.

## 2019-12-14 NOTE — Telephone Encounter (Signed)
Patient advised she will hold her injection on April 13 and 20 and the resume on January 12, 2020. Patient verbalized understanding.

## 2019-12-16 DIAGNOSIS — I10 Essential (primary) hypertension: Secondary | ICD-10-CM | POA: Diagnosis not present

## 2019-12-16 DIAGNOSIS — M0689 Other specified rheumatoid arthritis, multiple sites: Secondary | ICD-10-CM | POA: Diagnosis not present

## 2019-12-16 DIAGNOSIS — E7849 Other hyperlipidemia: Secondary | ICD-10-CM | POA: Diagnosis not present

## 2020-01-05 ENCOUNTER — Ambulatory Visit: Payer: Medicare Other | Attending: Internal Medicine

## 2020-01-05 DIAGNOSIS — Z23 Encounter for immunization: Secondary | ICD-10-CM

## 2020-01-05 NOTE — Progress Notes (Signed)
   Covid-19 Vaccination Clinic  Name:  Samantha Yates    MRN: [id number] DOB: 03-21-1961  01/05/2020  Samantha Yates was observed post Covid-19 immunization for 15 minutes without incident. She was provided with Vaccine Information Sheet and instruction to access the V-Safe system.   Samantha Yates was instructed to call 911 with any severe reactions post vaccine: Marland Kitchen Difficulty breathing  . Swelling of face and throat  . A fast heartbeat  . A bad rash all over body  . Dizziness and weakness   Immunizations Administered    Name Date Dose VIS Date Route   Moderna COVID-19 Vaccine 01/05/2020  8:43 AM 0.5 mL 08/2019 Intramuscular   Manufacturer: Moderna   Lot: [id number]   [id number]

## 2020-01-14 ENCOUNTER — Other Ambulatory Visit: Payer: Self-pay | Admitting: Rheumatology

## 2020-01-14 NOTE — Telephone Encounter (Signed)
Last Visit: 12/09/2019 Next Visit: 05/11/2020 Labs: 12/09/2019 Glucose is 106. Rest of CMP WNL. CBC WNL.  TB Gold: 12/09/2019 negative   Okay to refill per Dr. Estanislado Pandy.

## 2020-01-15 DIAGNOSIS — E7849 Other hyperlipidemia: Secondary | ICD-10-CM | POA: Diagnosis not present

## 2020-01-15 DIAGNOSIS — M0689 Other specified rheumatoid arthritis, multiple sites: Secondary | ICD-10-CM | POA: Diagnosis not present

## 2020-01-15 DIAGNOSIS — I10 Essential (primary) hypertension: Secondary | ICD-10-CM | POA: Diagnosis not present

## 2020-02-15 DIAGNOSIS — E7849 Other hyperlipidemia: Secondary | ICD-10-CM | POA: Diagnosis not present

## 2020-02-15 DIAGNOSIS — I10 Essential (primary) hypertension: Secondary | ICD-10-CM | POA: Diagnosis not present

## 2020-02-15 DIAGNOSIS — M0689 Other specified rheumatoid arthritis, multiple sites: Secondary | ICD-10-CM | POA: Diagnosis not present

## 2020-03-16 DIAGNOSIS — E7849 Other hyperlipidemia: Secondary | ICD-10-CM | POA: Diagnosis not present

## 2020-03-16 DIAGNOSIS — M0689 Other specified rheumatoid arthritis, multiple sites: Secondary | ICD-10-CM | POA: Diagnosis not present

## 2020-03-16 DIAGNOSIS — I10 Essential (primary) hypertension: Secondary | ICD-10-CM | POA: Diagnosis not present

## 2020-04-06 DIAGNOSIS — N342 Other urethritis: Secondary | ICD-10-CM | POA: Diagnosis not present

## 2020-04-22 ENCOUNTER — Other Ambulatory Visit: Payer: Self-pay | Admitting: Rheumatology

## 2020-04-22 NOTE — Telephone Encounter (Signed)
Patient was due to update lab work in June.  Please advise the patient to update lab work ASAP.

## 2020-04-22 NOTE — Telephone Encounter (Signed)
Last Visit: 12/09/2019 Next Visit: 05/11/2020 Labs: 12/09/2019 Glucose is 106. Rest of CMP WNL. CBC WNL.  TB Gold: 12/09/2019 Neg   Left message to advise patient she is due to update labs.   Current Dose per office note on 12/09/2019: Orencia 125 mg subcutaneous injections once weekly  Okay to refill 30 day supply Orencia?

## 2020-04-25 ENCOUNTER — Telehealth: Payer: Self-pay | Admitting: Rheumatology

## 2020-04-25 DIAGNOSIS — Z79899 Other long term (current) drug therapy: Secondary | ICD-10-CM

## 2020-04-25 NOTE — Telephone Encounter (Signed)
Spoke with patient and advised patient her labs need to be done as soon as possible. Patient states she would like to have orders to be released for [facility]. Orders released.

## 2020-04-25 NOTE — Telephone Encounter (Signed)
Patient left a message stating someone left her a message letting her know she is due for updated labs. Patient has an upcoming appointment on 8/25/2, and would like to know if it will be okay for her to have labs drawn them? Please call to advise.

## 2020-04-27 DIAGNOSIS — Z79899 Other long term (current) drug therapy: Secondary | ICD-10-CM | POA: Diagnosis not present

## 2020-04-27 NOTE — Progress Notes (Signed)
Office Visit Note  Patient: Samantha Yates             Date of Birth: 11/16/1960           MRN: [id number]             PCP: Sharilyn Sites, MD Referring: Sharilyn Sites, MD Visit Date: 05/11/2020 Occupation: @GUAROCC @  Subjective:  Medication monitoring   History of Present Illness: Samantha Yates is a 59 y.o. female with seropositive rheumatoid arthritis and osteoarthritis.  Patient is on Orencia 125 mg sq injections once weekly.  She skipped Orencia last week, yesterday, and is planning on holding it next week since she will be receiving her covid-19 booster dose tomorrow.  She denies any recent rheumatoid arthritis flares.  She denies any increased joint pain or joint swelling.  She has morning stiffness for a few minutes daily.     Activities of Daily Living:  Patient reports morning stiffness for  A few minutes.   Patient Reports nocturnal pain.  Difficulty dressing/grooming: Denies Difficulty climbing stairs: Reports Difficulty getting out of chair: Denies Difficulty using hands for taps, buttons, cutlery, and/or writing: Reports  Review of Systems  Constitutional: Positive for fatigue.  HENT: Positive for mouth dryness. Negative for mouth sores and nose dryness.   Eyes: Positive for dryness. Negative for pain and visual disturbance.  Respiratory: Negative for cough, hemoptysis, shortness of breath and difficulty breathing.   Cardiovascular: Negative for chest pain, palpitations, hypertension and swelling in legs/feet.  Gastrointestinal: Negative for blood in stool, constipation and diarrhea.  Endocrine: Negative for increased urination.  Genitourinary: Negative for difficulty urinating and painful urination.  Musculoskeletal: Positive for morning stiffness. Negative for arthralgias, joint pain, joint swelling, myalgias, muscle weakness, muscle tenderness and myalgias.  Skin: Negative for color change, pallor, rash, hair loss, nodules/bumps, redness, skin tightness,  ulcers and sensitivity to sunlight.  Allergic/Immunologic: Negative for susceptible to infections.  Neurological: Positive for headaches. Negative for dizziness, numbness, memory loss and weakness.  Hematological: Positive for bruising/bleeding tendency. Negative for swollen glands.  Psychiatric/Behavioral: Negative for depressed mood, confusion and sleep disturbance. The patient is not nervous/anxious.     PMFS History:  Patient Active Problem List   Diagnosis Date Noted  . Special screening for malignant neoplasms, colon   . High risk medication use 11/08/2016  . Primary osteoarthritis of both hands 11/08/2016  . Primary osteoarthritis of both feet 11/08/2016  . History of gastroesophageal reflux (GERD) 11/08/2016  . History of hypertension 11/08/2016  . History of anxiety 11/08/2016  . History of allergic rhinitis 11/08/2016  . Former smoker 11/08/2016  . GERD (gastroesophageal reflux disease) 09/17/2008  . Rheumatoid arthritis ([place]) 09/17/2006  . Anxiety 09/17/2006  . Hypertension 09/17/2005  . Allergic rhinitis 09/17/1996    Past Medical History:  Diagnosis Date  . Arthritis   . Hypertension   . Rheumatoid arthritis ([place])     Family History  Problem Relation Age of Onset  . Colon polyps Mother   . Diabetes Brother   . Diabetes Brother   . Hypertension Daughter   . Diabetes Daughter   . Healthy Son   . Colon cancer Cousin    Past Surgical History:  Procedure Laterality Date  . COLONOSCOPY N/A 04/09/2018   Procedure: COLONOSCOPY;  Surgeon: Danie Binder, MD;  Location: [facility];  Service: Endoscopy;  Laterality: N/A;  9:30  . POLYPECTOMY  04/09/2018   Procedure: POLYPECTOMY;  Surgeon: Danie Binder, MD;  Location: [facility];  Service: Endoscopy;;  ascending, descending colon,rectal, transverse,  . 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   Immunization History  Administered Date(s)  Administered  . Moderna SARS-COVID-2 Vaccination 12/04/2019, 01/05/2020  . Tdap 04/03/2015     Objective: Vital Signs: BP 95/65 (BP Location: Left Arm, Patient Position: Sitting, Cuff Size: Normal)   Pulse 71   Resp 15   Ht 5\' 8"  (1.727 m)   Wt 182 lb 9.6 oz (82.8 kg)   BMI 27.76 kg/m    Physical Exam Vitals and nursing note reviewed.  Constitutional:      Appearance: She is well-developed.  HENT:     Head: Normocephalic and atraumatic.  Eyes:     Conjunctiva/sclera: Conjunctivae normal.  Pulmonary:     Effort: Pulmonary effort is normal.  Abdominal:     Palpations: Abdomen is soft.  Musculoskeletal:     Cervical back: Normal range of motion.  Skin:    General: Skin is warm and dry.     Capillary Refill: Capillary refill takes less than 2 seconds.  Neurological:     Mental Status: She is alert and oriented to person, place, and time.  Psychiatric:        Behavior: Behavior normal.      Musculoskeletal Exam: C-spine, thoracic spine, and lumbar spine good ROM.  Shoulder joints, elbow joints, wrist joints, MCPs, PIPs, and DIPs good ROM with no synovitis.  Complete fist formation bilaterally.  Hip joints, knee joints, ankle joints, MTPs, PIPs, and DIPs good ROM with no synovitis.  No warmth or effusion of knee joints.  No tenderness or swelling of ankle joints.   CDAI Exam: CDAI Score: 0.2  Patient Global: 1 mm; Provider Global: 1 mm Swollen: 0 ; Tender: 0  Joint Exam 05/11/2020   No joint exam has been documented for this visit   There is currently no information documented on the homunculus. Go to the Rheumatology activity and complete the homunculus joint exam.  Investigation: No additional findings.  Imaging: No results found.  Recent Labs: Lab Results  Component Value Date   WBC 8.6 04/27/2020   HGB 12.3 04/27/2020   PLT 234 04/27/2020   NA 137 04/27/2020   K 4.7 04/27/2020   CL 103 04/27/2020   CO2 26 04/27/2020   GLUCOSE 94 04/27/2020   BUN 11  04/27/2020   CREATININE 0.94 04/27/2020   BILITOT 0.5 04/27/2020   ALKPHOS 57 04/04/2017   AST 16 04/27/2020   ALT 12 04/27/2020   PROT 6.5 04/27/2020   ALBUMIN 4.0 04/04/2017   CALCIUM 9.6 04/27/2020   GFRAA 76 04/27/2020   QFTBGOLDPLUS NEGATIVE 12/09/2019    Speciality Comments: No specialty comments available.  Procedures:  No procedures performed Allergies: Sulfamethoxazole    Assessment / Plan:     Visit Diagnoses: Rheumatoid arthritis involving multiple sites with positive rheumatoid factor (HCC) - +RF,+anti-CCP with nodulosis: She has no synovitis or tenderness on exam.  She has not had any recent rheumatoid arthritis flares.  She is clinically doing well on Orencia 125 mg subcutaneous injections once weekly.  She is not experiencing any joint pain or inflammation at this time.  She has a few minutes of morning stiffness every day as well as if she sits for prolonged periods of time.  She will continue on Orencia as prescribed.  She is advised to notify us if she develops increased joint pain or  joint swelling.  She will follow-up in the office in 5 months.  High risk medication use - Orencia Clickject 967 mg every 7 days. CBC and CMP WNL on 04/27/20.  TB gold negative on 12/09/19.   She has received both covid-19 vaccinations and will be receiving the booster tomorrow.  She held [place] last week, yesterday, and plans on holding it next Tuesday.  She was advised to continue to wear a mask, social distance, and practice good hand hygiene.  She was advised to notify us if she develops the covid-19 infection in order to receive the antibody infusion.  She voiced understanding.  - Plan: CBC with Differential/Platelet, COMPLETE METABOLIC PANEL WITH GFR  Primary osteoarthritis of both hands: She has no tenderness or inflammation on exam.  She has complete fist formation bilaterally.  Joint protection and muscle strengthening were discussed.  Primary osteoarthritis of both feet: She has  PIP and DIP thickening consistent with osteoarthritis of both feet. She has intermittent discomfort in both feet.  She was encouraged to wear proper fitting shoes.   Trochanteric bursitis of both hips - Resolved   Medial epicondylitis of right elbow: Resolved   Acute pain of left shoulder - AC joint arthritis-She is not having any discomfort at this time.   Other fatigue: Stable   Other medical conditions are listed as follows:   History of anxiety  History of hypertension  History of allergic rhinitis  History of gastroesophageal reflux (GERD)  Former smoker  Orders: Orders Placed This Encounter  Procedures  . CBC with Differential/Platelet  . COMPLETE METABOLIC PANEL WITH GFR   Meds ordered this encounter  Medications  . Abatacept (ORENCIA CLICKJECT) 591 MG/ML SOAJ    Sig: INJECT 125MG  (1 SYRINGE) SUBCUTANEOUSLY ONCE A WEEK.  (STORE IN REFRIGERATOR IMMEDIATELY.)    Dispense:  12 mL    Refill:  0      Follow-Up Instructions: Return in about 5 months (around 10/11/2020) for Rheumatoid arthritis, Osteoarthritis.   Ofilia Neas, PA-C  Note - This record has been created using Dragon software.  Chart creation errors have been sought, but may not always  have been located. Such creation errors do not reflect on  the standard of medical care.

## 2020-04-28 ENCOUNTER — Telehealth: Payer: Self-pay | Admitting: Rheumatology

## 2020-04-28 LAB — CBC WITH DIFFERENTIAL/PLATELET
Absolute Monocytes: 688 cells/uL (ref 200–950)
Basophils Absolute: 60 cells/uL (ref 0–200)
Basophils Relative: 0.7 %
Eosinophils Absolute: 163 cells/uL (ref 15–500)
Eosinophils Relative: 1.9 %
HCT: 36.7 % (ref 35.0–45.0)
Hemoglobin: 12.3 g/dL (ref 11.7–15.5)
Lymphs Abs: 3423 cells/uL (ref 850–3900)
MCH: 31.3 pg (ref 27.0–33.0)
MCHC: 33.5 g/dL (ref 32.0–36.0)
MCV: 93.4 fL (ref 80.0–100.0)
MPV: 11.4 fL (ref 7.5–12.5)
Monocytes Relative: 8 %
Neutro Abs: 4266 cells/uL (ref 1500–7800)
Neutrophils Relative %: 49.6 %
Platelets: 234 10*3/uL (ref 140–400)
RBC: 3.93 10*6/uL (ref 3.80–5.10)
RDW: 12.1 % (ref 11.0–15.0)
Total Lymphocyte: 39.8 %
WBC: 8.6 10*3/uL (ref 3.8–10.8)

## 2020-04-28 LAB — COMPLETE METABOLIC PANEL WITH GFR
AG Ratio: 1.6 (calc) (ref 1.0–2.5)
ALT: 12 U/L (ref 6–29)
AST: 16 U/L (ref 10–35)
Albumin: 4 g/dL (ref 3.6–5.1)
Alkaline phosphatase (APISO): 72 U/L (ref 37–153)
BUN: 11 mg/dL (ref 7–25)
CO2: 26 mmol/L (ref 20–32)
Calcium: 9.6 mg/dL (ref 8.6–10.4)
Chloride: 103 mmol/L (ref 98–110)
Creat: 0.94 mg/dL (ref 0.50–0.99)
GFR, Est African American: 76 mL/min/{1.73_m2} (ref >=60)
GFR, Est Non African American: 66 mL/min/{1.73_m2} (ref >=60)
Globulin: 2.5 g/dL (calc) (ref 1.9–3.7)
Glucose, Bld: 94 mg/dL (ref 65–99)
Potassium: 4.7 mmol/L (ref 3.5–5.3)
Sodium: 137 mmol/L (ref 135–146)
Total Bilirubin: 0.5 mg/dL (ref 0.2–1.2)
Total Protein: 6.5 g/dL (ref 6.1–8.1)

## 2020-04-28 NOTE — Telephone Encounter (Signed)
CBC and CMP are within normal limits.

## 2020-04-28 NOTE — Telephone Encounter (Signed)
Patient requesting a call back to discuss COVID vaccine, and autoimmune patients. Patient was told by a family member even though she has had both vaccines, she may not be protected. Patient was wondering if there is a test she could have done to show immunity to COVID? Please call to advise.

## 2020-04-28 NOTE — Telephone Encounter (Signed)
COVID-19 vaccine recommendations:    There is no direct evidence about the efficacy of the COVID-19 vaccine in individuals who are on medications that suppress the immune system.   Even if you are fully vaccinated, and you are on any medications that suppress your immune system, please continue to wear a mask, maintain at least six feet social distance and practice hand hygiene.   If you develop a COVID-19 infection, please contact your PCP or our office to determine if you need antibody infusion.  We anticipate that a booster vaccine will be available soon for immunosuppressed individuals. Please call our office before receiving your booster dose to make adjustments to your medication regimen.   Patient advsied she would need to contact her PCP to inquire about antibody testing.

## 2020-05-02 ENCOUNTER — Encounter: Payer: Self-pay | Admitting: *Deleted

## 2020-05-02 ENCOUNTER — Telehealth: Payer: Self-pay | Admitting: Rheumatology

## 2020-05-02 NOTE — Telephone Encounter (Signed)
COVID-19 vaccine recommendations:   COVID-19 vaccine is recommended for everyone (unless you are allergic to a vaccine component), even if you are on a medication that suppresses your immune system.   If you are on Orencia subcutaneous injection - hold medication one week prior to and one week after the first COVID-19 vaccine dose (only).   Do not take Tylenol or ant anti-inflammatory medications (NSAIDs) 24 hours prior to the COVID-19 vaccination.   There is no direct evidence about the efficacy of the COVID-19 vaccine in individuals who are on medications that suppress the immune system.   Even if you are fully vaccinated, and you are on any medications that suppress your immune system, please continue to wear a mask, maintain at least six feet social distance and practice hand hygiene.   If you develop a COVID-19 infection, please contact your PCP or our office to determine if you need antibody infusion.   The booster vaccine is now available for immunocompromised patients. It is advised that if you had Pfizer vaccine you should get Coca-Cola booster.  If you had a Moderna vaccine then you should get a Moderna booster. Johnson and Wynetta Emery does not have a booster vaccine at this time.  Please see the following web sites for updated information.   https://[email or web address]/Portals/0/Files/COVID-19-Vaccination-Patient-Resources.pdf  https://www.[email or web address].org/About-Us/Newsroom/Press-Releases/ID/1159

## 2020-05-02 NOTE — Telephone Encounter (Signed)
Patient called stating she wants to get her booster vaccine at [facility] and called their vaccine line.  Patient states they don't have a date at this time, but to check the news for updated information.  Patient states the nurse at [facility] told her if Dr. Estanislado Pandy puts a note in her chart they will be able to schedule once the vaccine is available.  Patient states she is due to take her Orencia tomorrow, but is not sure if she should take it due to the vaccine possibly becoming available.

## 2020-05-11 ENCOUNTER — Encounter: Payer: Self-pay | Admitting: Physician Assistant

## 2020-05-11 ENCOUNTER — Other Ambulatory Visit: Payer: Self-pay

## 2020-05-11 ENCOUNTER — Ambulatory Visit (INDEPENDENT_AMBULATORY_CARE_PROVIDER_SITE_OTHER): Payer: Medicare Other | Admitting: Physician Assistant

## 2020-05-11 VITALS — BP 95/65 | HR 71 | Resp 15 | Ht 68.0 in | Wt 182.6 lb

## 2020-05-11 DIAGNOSIS — Z8719 Personal history of other diseases of the digestive system: Secondary | ICD-10-CM

## 2020-05-11 DIAGNOSIS — Z79899 Other long term (current) drug therapy: Secondary | ICD-10-CM | POA: Diagnosis not present

## 2020-05-11 DIAGNOSIS — M19071 Primary osteoarthritis, right ankle and foot: Secondary | ICD-10-CM | POA: Diagnosis not present

## 2020-05-11 DIAGNOSIS — M19042 Primary osteoarthritis, left hand: Secondary | ICD-10-CM

## 2020-05-11 DIAGNOSIS — M0579 Rheumatoid arthritis with rheumatoid factor of multiple sites without organ or systems involvement: Secondary | ICD-10-CM | POA: Diagnosis not present

## 2020-05-11 DIAGNOSIS — Z8709 Personal history of other diseases of the respiratory system: Secondary | ICD-10-CM

## 2020-05-11 DIAGNOSIS — R5383 Other fatigue: Secondary | ICD-10-CM

## 2020-05-11 DIAGNOSIS — M25512 Pain in left shoulder: Secondary | ICD-10-CM

## 2020-05-11 DIAGNOSIS — M7701 Medial epicondylitis, right elbow: Secondary | ICD-10-CM

## 2020-05-11 DIAGNOSIS — M19041 Primary osteoarthritis, right hand: Secondary | ICD-10-CM | POA: Diagnosis not present

## 2020-05-11 DIAGNOSIS — Z8659 Personal history of other mental and behavioral disorders: Secondary | ICD-10-CM

## 2020-05-11 DIAGNOSIS — Z8679 Personal history of other diseases of the circulatory system: Secondary | ICD-10-CM

## 2020-05-11 DIAGNOSIS — M7061 Trochanteric bursitis, right hip: Secondary | ICD-10-CM | POA: Diagnosis not present

## 2020-05-11 DIAGNOSIS — M7062 Trochanteric bursitis, left hip: Secondary | ICD-10-CM

## 2020-05-11 DIAGNOSIS — Z87891 Personal history of nicotine dependence: Secondary | ICD-10-CM

## 2020-05-11 DIAGNOSIS — M19072 Primary osteoarthritis, left ankle and foot: Secondary | ICD-10-CM

## 2020-05-11 MED ORDER — ORENCIA CLICKJECT 125 MG/ML ~~LOC~~ SOAJ: SUBCUTANEOUS | 0 refills | Status: DC

## 2020-05-11 NOTE — Patient Instructions (Signed)
Standing Labs We placed an order today for your standing lab work.   Please have your standing labs drawn in November and every 3 months   If possible, please have your labs drawn 2 weeks prior to your appointment so that the provider can discuss your results at your appointment.  We have open lab daily Monday through Thursday from 8:30-12:30 PM and 1:30-4:30 PM and Friday from 8:30-12:30 PM and 1:30-4:00 PM at the office of Dr. Bo Merino, [facility].   Please be advised, patients with office appointments requiring lab work will take precedents over walk-in lab work.  If possible, please come for your lab work on Monday and Friday afternoons, as you may experience shorter wait times. The office is located at [address], [place], [place], [place] [address] No appointment is necessary.   Labs are drawn by [facility]. Please bring your co-pay at the time of your lab draw.  You may receive a bill from [facility] for your lab work.  If you wish to have your labs drawn at another location, please call the office 24 hours in advance to send orders.  If you have any questions regarding directions or hours of operation,  please call [phone].   As a reminder, please drink plenty of water prior to coming for your lab work. Thanks!   COVID-19 vaccine recommendations:   COVID-19 vaccine is recommended for everyone (unless you are allergic to a vaccine component), even if you are on a medication that suppresses your immune system.   If you are on Methotrexate, Cellcept (mycophenolate), Rinvoq, Morrie Sheldon, and Olumiant- hold the medication for 1 week after each vaccine. Hold Methotrexate for 2 weeks after the single dose COVID-19 vaccine.   If you are on Orencia subcutaneous injection - hold medication one week prior to and one week after the first COVID-19 vaccine dose (only).   If you are on Orencia IV infusions- time vaccination administration so that the first COVID-19  vaccination will occur four weeks after the infusion and postpone the subsequent infusion by one week.   If you are on Cyclophosphamide or Rituxan infusions please contact your doctor prior to receiving the COVID-19 vaccine.   Do not take Tylenol or any anti-inflammatory medications (NSAIDs) 24 hours prior to the COVID-19 vaccination.   There is no direct evidence about the efficacy of the COVID-19 vaccine in individuals who are on medications that suppress the immune system.   Even if you are fully vaccinated, and you are on any medications that suppress your immune system, please continue to wear a mask, maintain at least six feet social distance and practice hand hygiene.   If you develop a COVID-19 infection, please contact your PCP or our office to determine if you need antibody infusion.  The booster vaccine is now available for immunocompromised patients. It is advised that if you had Pfizer vaccine you should get Coca-Cola booster.  If you had a Moderna vaccine then you should get a Moderna booster. Johnson and Wynetta Emery does not have a booster vaccine at this time.  Please see the following web sites for updated information.   https://www.[email or web address].org/Portals/0/Files/COVID-19-Vaccination-Patient-Resources.pdf  https://www.rheumatology.org/About-Us/Newsroom/Press-Releases/ID/1159

## 2020-05-12 ENCOUNTER — Ambulatory Visit: Payer: Medicare Other | Attending: Internal Medicine

## 2020-05-12 DIAGNOSIS — Z23 Encounter for immunization: Secondary | ICD-10-CM

## 2020-05-12 NOTE — Progress Notes (Signed)
   Covid-19 Vaccination Clinic  Name:  Samantha Yates    MRN: [id number] DOB: 1961/01/10  05/12/2020  [name] was observed post Covid-19 immunization for 15 minutes without incident. She was provided with Vaccine Information Sheet and instruction to access the V-Safe system.   [name] was instructed to call 911 with any severe reactions post vaccine: Marland Kitchen Difficulty breathing  . Swelling of face and throat  . A fast heartbeat  . A bad rash all over body  . Dizziness and weakness

## 2020-05-17 DIAGNOSIS — I10 Essential (primary) hypertension: Secondary | ICD-10-CM | POA: Diagnosis not present

## 2020-05-17 DIAGNOSIS — M0689 Other specified rheumatoid arthritis, multiple sites: Secondary | ICD-10-CM | POA: Diagnosis not present

## 2020-05-17 DIAGNOSIS — E7849 Other hyperlipidemia: Secondary | ICD-10-CM | POA: Diagnosis not present

## 2020-06-10 DIAGNOSIS — R591 Generalized enlarged lymph nodes: Secondary | ICD-10-CM | POA: Diagnosis not present

## 2020-06-16 DIAGNOSIS — M1711 Unilateral primary osteoarthritis, right knee: Secondary | ICD-10-CM | POA: Diagnosis not present

## 2020-06-16 DIAGNOSIS — M797 Fibromyalgia: Secondary | ICD-10-CM | POA: Diagnosis not present

## 2020-06-16 DIAGNOSIS — E063 Autoimmune thyroiditis: Secondary | ICD-10-CM | POA: Diagnosis not present

## 2020-06-16 DIAGNOSIS — E7849 Other hyperlipidemia: Secondary | ICD-10-CM | POA: Diagnosis not present

## 2020-07-28 ENCOUNTER — Telehealth: Payer: Self-pay

## 2020-07-28 DIAGNOSIS — Z79899 Other long term (current) drug therapy: Secondary | ICD-10-CM | POA: Diagnosis not present

## 2020-07-28 NOTE — Telephone Encounter (Signed)
Lab Orders released.  

## 2020-07-28 NOTE — Telephone Encounter (Signed)
Patient called requesting labwork orders be sent to [facility] in [place].   Patient is going this morning 07/28/20.

## 2020-07-29 LAB — CBC WITH DIFFERENTIAL/PLATELET
Basophils Absolute: 0.1 10*3/uL (ref 0.0–0.2)
Basos: 1 %
EOS (ABSOLUTE): 0.2 10*3/uL (ref 0.0–0.4)
Eos: 3 %
Hematocrit: 38.8 % (ref 34.0–46.6)
Hemoglobin: 13.2 g/dL (ref 11.1–15.9)
Immature Grans (Abs): 0 10*3/uL (ref 0.0–0.1)
Immature Granulocytes: 1 %
Lymphocytes Absolute: 3.1 10*3/uL (ref 0.7–3.1)
Lymphs: 38 %
MCH: 31.4 pg (ref 26.6–33.0)
MCHC: 34 g/dL (ref 31.5–35.7)
MCV: 92 fL (ref 79–97)
Monocytes Absolute: 0.6 10*3/uL (ref 0.1–0.9)
Monocytes: 7 %
Neutrophils Absolute: 4.1 10*3/uL (ref 1.4–7.0)
Neutrophils: 50 %
Platelets: 264 10*3/uL (ref 150–450)
RBC: 4.2 x10E6/uL (ref 3.77–5.28)
RDW: 12.3 % (ref 11.7–15.4)
WBC: 8.1 10*3/uL (ref 3.4–10.8)

## 2020-07-29 LAB — CMP14+EGFR
ALT: 12 IU/L (ref 0–32)
AST: 16 IU/L (ref 0–40)
Albumin/Globulin Ratio: 1.8 (ref 1.2–2.2)
Albumin: 4.3 g/dL (ref 3.8–4.9)
Alkaline Phosphatase: 86 IU/L (ref 44–121)
BUN/Creatinine Ratio: 11 — ABNORMAL LOW (ref 12–28)
BUN: 10 mg/dL (ref 8–27)
Bilirubin Total: 0.3 mg/dL (ref 0.0–1.2)
CO2: 24 mmol/L (ref 20–29)
Calcium: 9.5 mg/dL (ref 8.7–10.3)
Chloride: 101 mmol/L (ref 96–106)
Creatinine, Ser: 0.89 mg/dL (ref 0.57–1.00)
GFR calc Af Amer: 81 mL/min/{1.73_m2} (ref >59)
GFR calc non Af Amer: 71 mL/min/{1.73_m2} (ref >59)
Globulin, Total: 2.4 g/dL (ref 1.5–4.5)
Glucose: 97 mg/dL (ref 65–99)
Potassium: 4.8 mmol/L (ref 3.5–5.2)
Sodium: 138 mmol/L (ref 134–144)
Total Protein: 6.7 g/dL (ref 6.0–8.5)

## 2020-07-29 NOTE — Telephone Encounter (Signed)
BUN/creatinine ratio is borderline low. Rest of CMP WNL.  CBC WNL.

## 2020-08-05 ENCOUNTER — Other Ambulatory Visit: Payer: Self-pay

## 2020-08-05 MED ORDER — PREDNISONE 5 MG PO TABS: ORAL_TABLET | ORAL | 0 refills | Status: DC

## 2020-08-05 NOTE — Telephone Encounter (Signed)
Ok to send in prednisone taper starting at 20 mg tapering by 5 mg every 4 days. Please advise the patient to avoid taking NSAIDs while on prednisone.  She should also take the prednisone in the morning with breakfast.

## 2020-08-05 NOTE — Telephone Encounter (Signed)
Patient called stating for the past 2 weeks her left hand and fingers have been painful and swollen.  Patient states Advil has been helping, but doesn't want to continue taking it.  Patient is requesting a prescription of Prednisone to be sent to [facility] [place].  Patient states she didn't want to request the Prednisone prescription from her PCP because they always prescribe a higher dosage and she prefers the lower dose that Dr. Estanislado Pandy prescribes.

## 2020-08-05 NOTE — Telephone Encounter (Signed)
Patient advised to avoid taking NSAIDs while on prednisone.  She should also take the prednisone in the morning with breakfast. Patient expressed understanding.

## 2020-08-16 ENCOUNTER — Other Ambulatory Visit (HOSPITAL_COMMUNITY): Payer: Self-pay | Admitting: Family Medicine

## 2020-08-16 DIAGNOSIS — E7849 Other hyperlipidemia: Secondary | ICD-10-CM | POA: Diagnosis not present

## 2020-08-16 DIAGNOSIS — Z1231 Encounter for screening mammogram for malignant neoplasm of breast: Secondary | ICD-10-CM

## 2020-08-16 DIAGNOSIS — M0689 Other specified rheumatoid arthritis, multiple sites: Secondary | ICD-10-CM | POA: Diagnosis not present

## 2020-08-16 DIAGNOSIS — I10 Essential (primary) hypertension: Secondary | ICD-10-CM | POA: Diagnosis not present

## 2020-08-22 ENCOUNTER — Other Ambulatory Visit: Payer: Self-pay | Admitting: Physician Assistant

## 2020-08-23 NOTE — Telephone Encounter (Signed)
Last Visit: 05/11/2020 Next Visit: 10/13/2020 Labs: 07/28/2020 BUN/creatinine ratio is borderline low. Rest of CMP WNL. CBC WNL. TB Gold: 12/09/2019 Neg   Current Dose per office note 02/28/4314: Orencia Clickject 400 mg every 7 days  DX: Rheumatoid arthritis involving multiple sites with positive rheumatoid factor   Okay to refill Orencia?

## 2020-08-24 ENCOUNTER — Other Ambulatory Visit: Payer: Self-pay

## 2020-08-24 ENCOUNTER — Ambulatory Visit (HOSPITAL_COMMUNITY)
Admission: RE | Admit: 2020-08-24 | Discharge: 2020-08-24 | Disposition: A | Discharge status: Home / Self Care | Payer: Medicare Other | Source: Ambulatory Visit | Attending: Family Medicine | Admitting: Family Medicine

## 2020-08-24 DIAGNOSIS — Z1231 Encounter for screening mammogram for malignant neoplasm of breast: Secondary | ICD-10-CM

## 2020-08-26 DIAGNOSIS — Z03818 Encounter for observation for suspected exposure to other biological agents ruled out: Secondary | ICD-10-CM | POA: Diagnosis not present

## 2020-08-31 ENCOUNTER — Telehealth: Payer: Self-pay | Admitting: Pharmacist

## 2020-08-31 NOTE — Telephone Encounter (Signed)
Received renewal application from [place] for [facility] patient assistance. Faxed signed and completed office portion, patient form, insurance documents, and med list. Spoke with patient about missing documents:  - Out-of-pocket prescription expenses - Social Security statement  Faxed application as is for now, but patient plans to drop off these documents so we can re-submit.  Knox Saliva, PharmD, MPH Clinical Pharmacist (Rheumatology and Pulmonology)

## 2020-09-08 DIAGNOSIS — J22 Unspecified acute lower respiratory infection: Secondary | ICD-10-CM | POA: Diagnosis not present

## 2020-09-08 DIAGNOSIS — Z03818 Encounter for observation for suspected exposure to other biological agents ruled out: Secondary | ICD-10-CM | POA: Diagnosis not present

## 2020-09-21 DIAGNOSIS — Z03818 Encounter for observation for suspected exposure to other biological agents ruled out: Secondary | ICD-10-CM | POA: Diagnosis not present

## 2020-09-21 DIAGNOSIS — Z1152 Encounter for screening for COVID-19: Secondary | ICD-10-CM | POA: Diagnosis not present

## 2020-09-28 ENCOUNTER — Telehealth: Payer: Self-pay

## 2020-09-28 NOTE — Telephone Encounter (Signed)
Received patient's proof of income. Faxed to [employer]. Will update once we receive a response.

## 2020-09-28 NOTE — Telephone Encounter (Signed)
Patient left a voicemail stating she just spoke with you regarding her [facility] application.  Patient states they need verification of income and she would like to get your email so she can send the letter she received with her income.  Patient requested a return call.

## 2020-09-28 NOTE — Telephone Encounter (Signed)
Samantha Yates followed up and patient will email income verification to [place] email today for [place] to submit with her Orencia PAP re-enrollment application. Closing encounter and f/u will occur in separate encounter.

## 2020-09-28 NOTE — Telephone Encounter (Signed)
Patient called today stating she receiving a letter from [place] dated 09/22/20 that she's not eligible for Orencia through [employer] patient assistance program. However no reason on letter given. Clinic hasn't received any notification from [employer] about application update.  Rachael - could you please look into this? She said she called [phone] since there is an appeal process, but couldn't reach anyone on the other end.

## 2020-09-28 NOTE — Telephone Encounter (Signed)
Called [employer], they ran a soft credit check and pulled an income of $46,300. They said patient can submit a federal tax return or her social security statement and a note saying that she does not file taxes.   Called patient and advised. She will email me the information to fax in to [employer].

## 2020-09-29 NOTE — Progress Notes (Signed)
Office Visit Note  Patient: Samantha Yates             Date of Birth: 09/02/1961           MRN: [id number]             PCP: Sharilyn Sites, MD Referring: Sharilyn Sites, MD Visit Date: 10/13/2020 Occupation: @GUAROCC @  Subjective:  Medication management.   History of Present Illness: Samantha Yates is a 60 y.o. female with history of seropositive rheumatoid arthritis.  She states she has been doing well on Orencia weekly injections.  She states she was under a lot of stress as her mother had a stroke and she was going to the hospital frequently.  She developed some discomfort in her right knee joint without swelling.  She took Advil for a few days and the symptoms have improved.  She denies any joint swelling.  Activities of Daily Living:  Patient reports morning stiffness for 1-2 minutes.   Patient Denies nocturnal pain.  Difficulty dressing/grooming: Denies Difficulty climbing stairs: Denies Difficulty getting out of chair: Denies Difficulty using hands for taps, buttons, cutlery, and/or writing: Reports  Review of Systems  Constitutional: Positive for fatigue.  HENT: Negative for mouth sores, mouth dryness and nose dryness.   Eyes: Positive for itching. Negative for pain, visual disturbance and dryness.  Respiratory: Negative for cough, hemoptysis, shortness of breath and difficulty breathing.   Cardiovascular: Negative for chest pain, palpitations and swelling in legs/feet.  Gastrointestinal: Negative for abdominal pain, blood in stool, constipation and diarrhea.  Endocrine: Negative for increased urination.  Genitourinary: Negative for painful urination.  Musculoskeletal: Positive for arthralgias, joint pain and morning stiffness. Negative for joint swelling, myalgias, muscle weakness, muscle tenderness and myalgias.  Skin: Negative for color change, rash and redness.  Allergic/Immunologic: Negative for susceptible to infections.  Neurological: Negative for dizziness,  numbness, headaches, memory loss and weakness.  Hematological: Negative for swollen glands.  Psychiatric/Behavioral: Positive for sleep disturbance. Negative for confusion.    PMFS History:  Patient Active Problem List   Diagnosis Date Noted  . Special screening for malignant neoplasms, colon   . High risk medication use 11/08/2016  . Primary osteoarthritis of both hands 11/08/2016  . Primary osteoarthritis of both feet 11/08/2016  . History of gastroesophageal reflux (GERD) 11/08/2016  . History of hypertension 11/08/2016  . History of anxiety 11/08/2016  . History of allergic rhinitis 11/08/2016  . Former smoker 11/08/2016  . GERD (gastroesophageal reflux disease) 09/17/2008  . Rheumatoid arthritis ([place]) 09/17/2006  . Anxiety 09/17/2006  . Hypertension 09/17/2005  . Allergic rhinitis 09/17/1996    Past Medical History:  Diagnosis Date  . Arthritis   . Hypertension   . Rheumatoid arthritis ([place])     Family History  Problem Relation Age of Onset  . Colon polyps Mother   . Stroke Mother   . Diabetes Brother   . Diabetes Brother   . Hypertension Daughter   . Diabetes Daughter   . Healthy Son   . Colon cancer Cousin    Past Surgical History:  Procedure Laterality Date  . COLONOSCOPY N/A 04/09/2018   Procedure: COLONOSCOPY;  Surgeon: Danie Binder, MD;  [facility];  Service: Endoscopy;  Laterality: N/A;  9:30  . POLYPECTOMY  04/09/2018   Procedure: POLYPECTOMY;  Surgeon: Danie Binder, MD;  Location: [facility];  Service: Endoscopy;;  ascending, descending colon,rectal, transverse,  . 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   Immunization History  Administered Date(s) Administered  . Moderna Sars-Covid-2 Vaccination 12/04/2019, 01/05/2020, 05/12/2020  . Tdap 04/03/2015     Objective: Vital Signs: BP 101/71 (BP Location: Left Arm, Patient Position: Sitting, Cuff Size: Normal)    Pulse 75   Ht 5' 7.5" (1.715 m)   Wt 185 lb (83.9 kg)   BMI 28.55 kg/m    Physical Exam Vitals and nursing note reviewed.  Constitutional:      Appearance: She is well-developed and well-nourished.  HENT:     Head: Normocephalic and atraumatic.  Eyes:     Extraocular Movements: EOM normal.     Conjunctiva/sclera: Conjunctivae normal.  Cardiovascular:     Rate and Rhythm: Normal rate and regular rhythm.     Pulses: Intact distal pulses.     Heart sounds: Normal heart sounds.  Pulmonary:     Effort: Pulmonary effort is normal.     Breath sounds: Normal breath sounds.  Abdominal:     General: Bowel sounds are normal.     Palpations: Abdomen is soft.  Musculoskeletal:     Cervical back: Normal range of motion.  Lymphadenopathy:     Cervical: No cervical adenopathy.  Skin:    General: Skin is warm and dry.     Capillary Refill: Capillary refill takes less than 2 seconds.  Neurological:     Mental Status: She is alert and oriented to person, place, and time.  Psychiatric:        Mood and Affect: Mood and affect normal.        Behavior: Behavior normal.      Musculoskeletal Exam: C-spine, thoracic and lumbar spine were in good range of motion.  Shoulder joints, elbow joints, wrist joints, MCPs PIPs and DIPs with good range of motion with no synovitis.  Hip joints, knee joints, ankles were in good range of motion.  She had no tenderness over MTPs.  CDAI Exam: CDAI Score: 0.2  Patient Global: 1 mm; Provider Global: 1 mm Swollen: 0 ; Tender: 0  Joint Exam 10/13/2020   No joint exam has been documented for this visit   There is currently no information documented on the homunculus. Go to the Rheumatology activity and complete the homunculus joint exam.  Investigation: No additional findings.  Imaging: No results found.  Recent Labs: Lab Results  Component Value Date   WBC 8.1 07/28/2020   HGB 13.2 07/28/2020   PLT 264 07/28/2020   NA 138 07/28/2020   K 4.8  07/28/2020   CL 101 07/28/2020   CO2 24 07/28/2020   GLUCOSE 97 07/28/2020   BUN 10 07/28/2020   CREATININE 0.89 07/28/2020   BILITOT 0.3 07/28/2020   ALKPHOS 86 07/28/2020   AST 16 07/28/2020   ALT 12 07/28/2020   PROT 6.7 07/28/2020   ALBUMIN 4.3 07/28/2020   CALCIUM 9.5 07/28/2020   GFRAA 81 07/28/2020   QFTBGOLDPLUS NEGATIVE 12/09/2019    Speciality Comments: No specialty comments available.  Procedures:  No procedures performed Allergies: Sulfamethoxazole   Assessment / Plan:     Visit Diagnoses: Rheumatoid arthritis involving multiple sites with positive rheumatoid factor (HCC) - +RF,+anti-CCP with nodulosis.  Patient is clinically doing well with no synovitis on my examination today.  She recently had some discomfort in her right knee joint due to overuse with the symptoms are improving.  High risk medication use - Orencia Clickject 0000000 mg every 7 days.  Her  labs have been stable.  She will get labs in February and then every 3 months to monitor for drug toxicity.  She will get TB gold in February.  Patient had 3 doses of COVID-19 vaccine.  I discussed a booster 6 months after the third vaccine.  Use of mask, social distancing and hand hygiene was emphasized.  Instructions regarding [name] were also placed in the AVS.  Primary osteoarthritis of both hands-joint protection muscle strengthening was discussed.  Primary osteoarthritis of both feet-she is currently not having any discomfort in her feet.  Trochanteric bursitis of both hips - Resolved   Medial epicondylitis of right elbow - Resolved   Other fatigue-improved.  History of anxiety  History of allergic rhinitis  History of hypertension-blood pressure is normal.  History of gastroesophageal reflux (GERD)  Former smoker  Orders: Orders Placed This Encounter  Procedures  . QuantiFERON-TB Gold Plus   No orders of the defined types were placed in this encounter.    Follow-Up Instructions: Return in  about 5 months (around 03/13/2021) for Rheumatoid arthritis, Osteoarthritis.   Bo Merino, MD  Note - This record has been created using Editor, commissioning.  Chart creation errors have been sought, but may not always  have been located. Such creation errors do not reflect on  the standard of medical care.

## 2020-10-05 NOTE — Telephone Encounter (Signed)
Called [employer] to follow up on patient's application status. Patient's SS statement has been received, and is in review. Rep advised to allow 24-72 more hours for review.  Phone# [phone]

## 2020-10-10 NOTE — Telephone Encounter (Signed)
Received a fax from  [place] regarding an approval for [facility] patient assistance from 10/10/20 to 09/16/21.   Phone number: [phone]

## 2020-10-13 ENCOUNTER — Ambulatory Visit (INDEPENDENT_AMBULATORY_CARE_PROVIDER_SITE_OTHER): Payer: Medicare Other | Admitting: Rheumatology

## 2020-10-13 ENCOUNTER — Encounter: Payer: Self-pay | Admitting: Rheumatology

## 2020-10-13 ENCOUNTER — Other Ambulatory Visit: Payer: Self-pay

## 2020-10-13 VITALS — BP 101/71 | HR 75 | Ht 67.5 in | Wt 185.0 lb

## 2020-10-13 DIAGNOSIS — M19042 Primary osteoarthritis, left hand: Secondary | ICD-10-CM

## 2020-10-13 DIAGNOSIS — Z8709 Personal history of other diseases of the respiratory system: Secondary | ICD-10-CM

## 2020-10-13 DIAGNOSIS — R5383 Other fatigue: Secondary | ICD-10-CM

## 2020-10-13 DIAGNOSIS — M7061 Trochanteric bursitis, right hip: Secondary | ICD-10-CM

## 2020-10-13 DIAGNOSIS — M19041 Primary osteoarthritis, right hand: Secondary | ICD-10-CM | POA: Diagnosis not present

## 2020-10-13 DIAGNOSIS — M0579 Rheumatoid arthritis with rheumatoid factor of multiple sites without organ or systems involvement: Secondary | ICD-10-CM

## 2020-10-13 DIAGNOSIS — Z8679 Personal history of other diseases of the circulatory system: Secondary | ICD-10-CM

## 2020-10-13 DIAGNOSIS — M7701 Medial epicondylitis, right elbow: Secondary | ICD-10-CM | POA: Diagnosis not present

## 2020-10-13 DIAGNOSIS — Z8719 Personal history of other diseases of the digestive system: Secondary | ICD-10-CM | POA: Diagnosis not present

## 2020-10-13 DIAGNOSIS — Z79899 Other long term (current) drug therapy: Secondary | ICD-10-CM | POA: Diagnosis not present

## 2020-10-13 DIAGNOSIS — M25512 Pain in left shoulder: Secondary | ICD-10-CM

## 2020-10-13 DIAGNOSIS — Z87891 Personal history of nicotine dependence: Secondary | ICD-10-CM

## 2020-10-13 DIAGNOSIS — M19071 Primary osteoarthritis, right ankle and foot: Secondary | ICD-10-CM

## 2020-10-13 DIAGNOSIS — Z8659 Personal history of other mental and behavioral disorders: Secondary | ICD-10-CM

## 2020-10-13 DIAGNOSIS — M19072 Primary osteoarthritis, left ankle and foot: Secondary | ICD-10-CM

## 2020-10-13 NOTE — Patient Instructions (Signed)
Standing Labs We placed an order today for your standing lab work.   Please have your standing labs drawn in February and every 3 months   If possible, please have your labs drawn 2 weeks prior to your appointment so that the provider can discuss your results at your appointment.  We have open lab daily Monday through Thursday from 8:30-12:30 PM and 1:30-4:30 PM and Friday from 8:30-12:30 PM and 1:30-4:00 PM at the office of Dr. Machell Wirthlin, [facility].   Please be advised, patients with office appointments requiring lab work will take precedents over walk-in lab work.  If possible, please come for your lab work on Monday and Friday afternoons, as you may experience shorter wait times. The office is located at [address], [address], [place], [place] [address] No appointment is necessary.   Labs are drawn by [facility]. Please bring your co-pay at the time of your lab draw.  You may receive a bill from [facility] for your lab work.  If you wish to have your labs drawn at another location, please call the office 24 hours in advance to send orders.  If you have any questions regarding directions or hours of operation,  please call [phone].   As a reminder, please drink plenty of water prior to coming for your lab work. Thanks!  COVID-19 vaccine recommendations:   COVID-19 vaccine is recommended for everyone (unless you are allergic to a vaccine component), even if you are on a medication that suppresses your immune system.   If you are on Methotrexate, Cellcept (mycophenolate), Rinvoq, Xeljanz, and Olumiant- hold the medication for 1 week after each vaccine. Hold Methotrexate for 2 weeks after the single dose COVID-19 vaccine.   If you are on Orencia subcutaneous injection - hold medication one week prior to and one week after the first COVID-19 vaccine dose (only).   If you are on Orencia IV infusions- time vaccination administration so that the first COVID-19  vaccination will occur four weeks after the infusion and postpone the subsequent infusion by one week.   If you are on Cyclophosphamide or Rituxan infusions please contact your doctor prior to receiving the COVID-19 vaccine.   Do not take Tylenol or any anti-inflammatory medications (NSAIDs) 24 hours prior to the COVID-19 vaccination.   There is no direct evidence about the efficacy of the COVID-19 vaccine in individuals who are on medications that suppress the immune system.   Even if you are fully vaccinated, and you are on any medications that suppress your immune system, please continue to wear a mask, maintain at least six feet social distance and practice hand hygiene.   If you develop a COVID-19 infection, please contact your PCP or our office to determine if you need monoclonal antibody infusion.  The booster vaccine is now available for immunocompromised patients.   Please see the following web sites for updated information.   [email or web address]/[email or web address]-Vaccination-Patient-Resources.[email or web address]     

## 2020-10-15 DIAGNOSIS — I10 Essential (primary) hypertension: Secondary | ICD-10-CM | POA: Diagnosis not present

## 2020-10-15 DIAGNOSIS — M0689 Other specified rheumatoid arthritis, multiple sites: Secondary | ICD-10-CM | POA: Diagnosis not present

## 2020-10-15 DIAGNOSIS — E7849 Other hyperlipidemia: Secondary | ICD-10-CM | POA: Diagnosis not present

## 2020-10-21 DIAGNOSIS — Z0001 Encounter for general adult medical examination with abnormal findings: Secondary | ICD-10-CM | POA: Diagnosis not present

## 2020-11-02 ENCOUNTER — Ambulatory Visit (HOSPITAL_COMMUNITY)
Admission: RE | Admit: 2020-11-02 | Discharge: 2020-11-02 | Disposition: A | Discharge status: Home / Self Care | Payer: Medicare Other | Source: Ambulatory Visit | Attending: Family Medicine | Admitting: Family Medicine

## 2020-11-02 ENCOUNTER — Other Ambulatory Visit: Payer: Self-pay

## 2020-11-02 ENCOUNTER — Other Ambulatory Visit (HOSPITAL_COMMUNITY): Payer: Self-pay | Admitting: Family Medicine

## 2020-11-02 ENCOUNTER — Other Ambulatory Visit: Payer: Self-pay | Admitting: Family Medicine

## 2020-11-02 DIAGNOSIS — J322 Chronic ethmoidal sinusitis: Secondary | ICD-10-CM | POA: Diagnosis not present

## 2020-11-02 DIAGNOSIS — S0083XA Contusion of other part of head, initial encounter: Secondary | ICD-10-CM

## 2020-11-02 DIAGNOSIS — J320 Chronic maxillary sinusitis: Secondary | ICD-10-CM | POA: Diagnosis not present

## 2020-11-02 DIAGNOSIS — J3489 Other specified disorders of nose and nasal sinuses: Secondary | ICD-10-CM | POA: Diagnosis not present

## 2020-11-02 DIAGNOSIS — S0590XA Unspecified injury of unspecified eye and orbit, initial encounter: Secondary | ICD-10-CM | POA: Diagnosis not present

## 2020-12-06 ENCOUNTER — Other Ambulatory Visit: Payer: Self-pay | Admitting: *Deleted

## 2020-12-06 ENCOUNTER — Telehealth: Payer: Self-pay

## 2020-12-06 DIAGNOSIS — M0579 Rheumatoid arthritis with rheumatoid factor of multiple sites without organ or systems involvement: Secondary | ICD-10-CM

## 2020-12-06 DIAGNOSIS — Z79899 Other long term (current) drug therapy: Secondary | ICD-10-CM

## 2020-12-06 NOTE — Addendum Note (Signed)
Addended by: Shona Needles on: 12/06/2020 12:10 PM   Modules accepted: Orders

## 2020-12-06 NOTE — Telephone Encounter (Signed)
Lab orders changed to [facility] and released

## 2020-12-06 NOTE — Telephone Encounter (Signed)
Patient requested her labwork orders be sent to [facility] in [place].  Patient states she will be going tomorrow 12/07/20.

## 2020-12-13 ENCOUNTER — Telehealth: Payer: Self-pay

## 2020-12-13 NOTE — Telephone Encounter (Signed)
Patient left a voicemail stating she requested her labwork be sent to [facility] in [place].  Patient states she went and they didn't have her orders.  Patient requested a return call.

## 2020-12-14 DIAGNOSIS — I10 Essential (primary) hypertension: Secondary | ICD-10-CM | POA: Diagnosis not present

## 2020-12-14 DIAGNOSIS — M0689 Other specified rheumatoid arthritis, multiple sites: Secondary | ICD-10-CM | POA: Diagnosis not present

## 2020-12-14 DIAGNOSIS — E7849 Other hyperlipidemia: Secondary | ICD-10-CM | POA: Diagnosis not present

## 2020-12-14 NOTE — Telephone Encounter (Signed)
I called patient, lab orders were released on 12/06/2020, patient did not go to have labs drawn until 12/13/2020, order had already been deleted by [facility], new orders faxed

## 2020-12-16 DIAGNOSIS — Z79899 Other long term (current) drug therapy: Secondary | ICD-10-CM | POA: Diagnosis not present

## 2020-12-16 DIAGNOSIS — M0579 Rheumatoid arthritis with rheumatoid factor of multiple sites without organ or systems involvement: Secondary | ICD-10-CM | POA: Diagnosis not present

## 2020-12-19 NOTE — Progress Notes (Signed)
Absolute lymphocyte count is borderline elevated. WBC count is WNL.  Rest of CBC WNL.

## 2020-12-21 LAB — QUANTIFERON-TB GOLD PLUS
QuantiFERON Mitogen Value: >10 IU/mL
QuantiFERON Nil Value: 0.1 IU/mL
QuantiFERON TB1 Ag Value: 0.14 IU/mL
QuantiFERON TB2 Ag Value: 0.13 IU/mL
QuantiFERON-TB Gold Plus: NEGATIVE

## 2020-12-21 LAB — CBC WITH DIFFERENTIAL/PLATELET
Basophils Absolute: 0.1 10*3/uL (ref 0.0–0.2)
Basos: 1 %
EOS (ABSOLUTE): 0.2 10*3/uL (ref 0.0–0.4)
Eos: 3 %
Hematocrit: 37.3 % (ref 34.0–46.6)
Hemoglobin: 12.4 g/dL (ref 11.1–15.9)
Immature Grans (Abs): 0 10*3/uL (ref 0.0–0.1)
Immature Granulocytes: 0 %
Lymphocytes Absolute: 3.4 10*3/uL — ABNORMAL HIGH (ref 0.7–3.1)
Lymphs: 42 %
MCH: 31.4 pg (ref 26.6–33.0)
MCHC: 33.2 g/dL (ref 31.5–35.7)
MCV: 94 fL (ref 79–97)
Monocytes Absolute: 0.5 10*3/uL (ref 0.1–0.9)
Monocytes: 7 %
Neutrophils Absolute: 3.8 10*3/uL (ref 1.4–7.0)
Neutrophils: 47 %
Platelets: 224 10*3/uL (ref 150–450)
RBC: 3.95 x10E6/uL (ref 3.77–5.28)
RDW: 12.6 % (ref 11.7–15.4)
WBC: 8 10*3/uL (ref 3.4–10.8)

## 2020-12-21 LAB — CMP14+EGFR
ALT: 13 IU/L (ref 0–32)
AST: 18 IU/L (ref 0–40)
Albumin/Globulin Ratio: 1.4 (ref 1.2–2.2)
Albumin: 3.8 g/dL (ref 3.8–4.8)
Alkaline Phosphatase: 81 IU/L (ref 44–121)
BUN/Creatinine Ratio: 14 (ref 12–28)
BUN: 11 mg/dL (ref 8–27)
Bilirubin Total: 0.2 mg/dL (ref 0.0–1.2)
CO2: 22 mmol/L (ref 20–29)
Calcium: 9.4 mg/dL (ref 8.7–10.3)
Chloride: 102 mmol/L (ref 96–106)
Creatinine, Ser: 0.76 mg/dL (ref 0.57–1.00)
Globulin, Total: 2.8 g/dL (ref 1.5–4.5)
Glucose: 74 mg/dL (ref 65–99)
Potassium: 5.1 mmol/L (ref 3.5–5.2)
Sodium: 140 mmol/L (ref 134–144)
Total Protein: 6.6 g/dL (ref 6.0–8.5)
eGFR: 89 mL/min/{1.73_m2} (ref >59)

## 2021-01-02 ENCOUNTER — Other Ambulatory Visit: Payer: Self-pay | Admitting: *Deleted

## 2021-01-02 MED ORDER — ORENCIA CLICKJECT 125 MG/ML ~~LOC~~ SOAJ: SUBCUTANEOUS | 0 refills | Status: DC

## 2021-01-02 NOTE — Telephone Encounter (Signed)
RX faxed from [facility]  Next Visit: 03/16/2021  Last Visit: 10/13/2020  Last Fill: 08/23/2020  DX: Rheumatoid arthritis involving multiple sites with positive rheumatoid factor   Current Dose per office note 0/98/1191, Orencia Clickject 478 mg every 7 days   Labs: 12/16/2020, Absolute lymphocyte count is borderline elevated. WBC count is WNL. Rest of CBC WNL.   TB Gold: 12/16/2020, negative  Okay to refill Orencia?

## 2021-01-14 DIAGNOSIS — E7849 Other hyperlipidemia: Secondary | ICD-10-CM | POA: Diagnosis not present

## 2021-01-14 DIAGNOSIS — M0689 Other specified rheumatoid arthritis, multiple sites: Secondary | ICD-10-CM | POA: Diagnosis not present

## 2021-01-14 DIAGNOSIS — I10 Essential (primary) hypertension: Secondary | ICD-10-CM | POA: Diagnosis not present

## 2021-01-21 DIAGNOSIS — Z20822 Contact with and (suspected) exposure to covid-19: Secondary | ICD-10-CM | POA: Diagnosis not present

## 2021-01-24 DIAGNOSIS — J069 Acute upper respiratory infection, unspecified: Secondary | ICD-10-CM | POA: Diagnosis not present

## 2021-01-24 DIAGNOSIS — Z681 Body mass index (BMI) 19 or less, adult: Secondary | ICD-10-CM | POA: Diagnosis not present

## 2021-02-14 DIAGNOSIS — E7849 Other hyperlipidemia: Secondary | ICD-10-CM | POA: Diagnosis not present

## 2021-02-14 DIAGNOSIS — I10 Essential (primary) hypertension: Secondary | ICD-10-CM | POA: Diagnosis not present

## 2021-02-14 DIAGNOSIS — M0689 Other specified rheumatoid arthritis, multiple sites: Secondary | ICD-10-CM | POA: Diagnosis not present

## 2021-03-02 NOTE — Progress Notes (Deleted)
Office Visit Note  Patient: Samantha Yates             Date of Birth: Oct 26, 1960           MRN: [id number]             PCP: Sharilyn Sites, MD Referring: Sharilyn Sites, MD Visit Date: 03/16/2021 Occupation: @GUAROCC @  Subjective:  No chief complaint on file.   History of Present Illness: Samantha Yates is a 60 y.o. female ***   Activities of Daily Living:  Patient reports morning stiffness for *** {minute/hour:19697}.   Patient {ACTIONS;DENIES/REPORTS:21021675::"Denies"} nocturnal pain.  Difficulty dressing/grooming: {ACTIONS;DENIES/REPORTS:21021675::"Denies"} Difficulty climbing stairs: {ACTIONS;DENIES/REPORTS:21021675::"Denies"} Difficulty getting out of chair: {ACTIONS;DENIES/REPORTS:21021675::"Denies"} Difficulty using hands for taps, buttons, cutlery, and/or writing: {ACTIONS;DENIES/REPORTS:21021675::"Denies"}  No Rheumatology ROS completed.   PMFS History:  Patient Active Problem List   Diagnosis Date Noted   Special screening for malignant neoplasms, colon    High risk medication use 11/08/2016   Primary osteoarthritis of both hands 11/08/2016   Primary osteoarthritis of both feet 11/08/2016   History of gastroesophageal reflux (GERD) 11/08/2016   History of hypertension 11/08/2016   History of anxiety 11/08/2016   History of allergic rhinitis 11/08/2016   Former smoker 11/08/2016   GERD (gastroesophageal reflux disease) 09/17/2008   Rheumatoid arthritis ([place]) 09/17/2006   Anxiety 09/17/2006   Hypertension 09/17/2005   Allergic rhinitis 09/17/1996    Past Medical History:  Diagnosis Date   Arthritis    Hypertension    Rheumatoid arthritis ([place])     Family History  Problem Relation Age of Onset   Colon polyps Mother    Stroke Mother    Diabetes Brother    Diabetes Brother    Hypertension Daughter    Diabetes Daughter    Healthy Son    Colon cancer Cousin    Past Surgical History:  Procedure Laterality Date   COLONOSCOPY N/A 04/09/2018    Procedure: COLONOSCOPY;  Surgeon: Danie Binder, MD;  [facility];  Service: Endoscopy;  Laterality: N/A;  9:30   POLYPECTOMY  04/09/2018   Procedure: POLYPECTOMY;  Surgeon: Danie Binder, MD;  [facility];  Service: Endoscopy;;  ascending, descending colon,rectal, transverse,   TONSILLECTOMY     Social History   Social History Narrative   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   Immunization History  Administered Date(s) Administered   Moderna Sars-Covid-2 Vaccination 12/04/2019, 01/05/2020, 05/12/2020   Tdap 04/03/2015     Objective: Vital Signs: There were no vitals taken for this visit.   Physical Exam   Musculoskeletal Exam:   CDAI Exam: CDAI Score: -- Patient Global: --; Provider Global: -- Swollen: --; Tender: -- Joint Exam 03/16/2021   No joint exam has been documented for this visit   There is currently no information documented on the homunculus. Go to the Rheumatology activity and complete the homunculus joint exam.  Investigation: No additional findings.  Imaging: No results found.  Recent Labs: Lab Results  Component Value Date   WBC 8.0 12/16/2020   HGB 12.4 12/16/2020   PLT 224 12/16/2020   NA 140 12/16/2020   K 5.1 12/16/2020   CL 102 12/16/2020   CO2 22 12/16/2020   GLUCOSE 74 12/16/2020   BUN 11 12/16/2020   CREATININE 0.76 12/16/2020   BILITOT 0.2 12/16/2020   ALKPHOS 81 12/16/2020   AST 18 12/16/2020   ALT 13 12/16/2020   PROT 6.6 12/16/2020   ALBUMIN 3.8 12/16/2020   CALCIUM  9.4 12/16/2020   GFRAA 81 07/28/2020   QFTBGOLDPLUS Negative 12/16/2020    Speciality Comments: No specialty comments available.  Procedures:  No procedures performed Allergies: Sulfamethoxazole   Assessment / Plan:     Visit Diagnoses: No diagnosis found.  Orders: No orders of the defined types were placed in this encounter.  No orders of the defined types were placed in this encounter.   Face-to-face time spent  with patient was *** minutes. Greater than 50% of time was spent in counseling and coordination of care.  Follow-Up Instructions: No follow-ups on file.   Earnestine Mealing, CMA  Note - This record has been created using Editor, commissioning.  Chart creation errors have been sought, but may not always  have been located. Such creation errors do not reflect on  the standard of medical care.

## 2021-03-13 ENCOUNTER — Other Ambulatory Visit: Payer: Self-pay

## 2021-03-13 MED ORDER — ORENCIA CLICKJECT 125 MG/ML ~~LOC~~ SOAJ: SUBCUTANEOUS | 0 refills | Status: DC

## 2021-03-13 NOTE — Telephone Encounter (Signed)
Refill request received via fax from [facility] for [place].   Next Visit: 03/16/2021  Last Visit: 10/13/2020  Last Fill: 01/02/2021  RU:[id number] arthritis involving multiple sites with positive rheumatoid factor  Current Dose per office note on 1/47/8295: Orencia Clickject 621 mg every 7 days.  Labs: 12/16/2020 Absolute lymphocyte count is borderline elevated. WBC count is WNL.  Rest ofCBC WNL. CMP WNL.  TB Gold: 12/16/2020 negative   Patient will update labs at scheduled appointment on 03/16/2021.   Okay to refill orencia?

## 2021-03-14 ENCOUNTER — Encounter: Payer: Self-pay | Admitting: Internal Medicine

## 2021-03-14 NOTE — Progress Notes (Deleted)
Office Visit Note  Patient: Samantha Yates             Date of Birth: 1960/11/28           MRN: [id number]             PCP: Sharilyn Sites, MD Referring: Sharilyn Sites, MD Visit Date: 03/28/2021 Occupation: @GUAROCC @  Subjective:    History of Present Illness: NIASIA LANPHEAR is a 60 y.o. female with history of seropositive rheumatoid arthritis and osteoarthritis.  Patient is on Orencia 125 mg sq injections every week.  CBC and CMP updated on 12/16/2020.  She is due to update lab work.  Orders for CBC and CMP were released.  Her next lab work will be due in October and every 3 months to monitor for drug toxicity.  Standing orders for CBC and CMP remain in place.  TB Gold negative on 12/16/2020 and will continue to be monitored on a yearly basis. Discussed the importance of holding Orencia if she develops signs or symptoms of an infection and to resume once the infection has completely cleared.  Activities of Daily Living:  Patient reports morning stiffness for *** {minute/hour:19697}.   Patient {ACTIONS;DENIES/REPORTS:21021675::"Denies"} nocturnal pain.  Difficulty dressing/grooming: {ACTIONS;DENIES/REPORTS:21021675::"Denies"} Difficulty climbing stairs: {ACTIONS;DENIES/REPORTS:21021675::"Denies"} Difficulty getting out of chair: {ACTIONS;DENIES/REPORTS:21021675::"Denies"} Difficulty using hands for taps, buttons, cutlery, and/or writing: {ACTIONS;DENIES/REPORTS:21021675::"Denies"}  No Rheumatology ROS completed.   PMFS History:  Patient Active Problem List   Diagnosis Date Noted   Special screening for malignant neoplasms, colon    High risk medication use 11/08/2016   Primary osteoarthritis of both hands 11/08/2016   Primary osteoarthritis of both feet 11/08/2016   History of gastroesophageal reflux (GERD) 11/08/2016   History of hypertension 11/08/2016   History of anxiety 11/08/2016   History of allergic rhinitis 11/08/2016   Former smoker 11/08/2016   GERD  (gastroesophageal reflux disease) 09/17/2008   Rheumatoid arthritis ([place]) 09/17/2006   Anxiety 09/17/2006   Hypertension 09/17/2005   Allergic rhinitis 09/17/1996    Past Medical History:  Diagnosis Date   Arthritis    Hypertension    Rheumatoid arthritis ([place])     Family History  Problem Relation Age of Onset   Colon polyps Mother    Stroke Mother    Diabetes Brother    Diabetes Brother    Hypertension Daughter    Diabetes Daughter    Healthy Son    Colon cancer Cousin    Past Surgical History:  Procedure Laterality Date   COLONOSCOPY N/A 04/09/2018   Procedure: COLONOSCOPY;  Surgeon: Danie Binder, MD;  [facility];  Service: Endoscopy;  Laterality: N/A;  9:30   POLYPECTOMY  04/09/2018   Procedure: POLYPECTOMY;  Surgeon: Danie Binder, MD;  [facility];  Service: Endoscopy;;  ascending, descending colon,rectal, transverse,   TONSILLECTOMY     Social History   Social History Narrative   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   Immunization History  Administered Date(s) Administered   Moderna Sars-Covid-2 Vaccination 12/04/2019, 01/05/2020, 05/12/2020   Tdap 04/03/2015     Objective: Vital Signs: There were no vitals taken for this visit.   Physical Exam   Musculoskeletal Exam: ***  CDAI Exam: CDAI Score: -- Patient Global: --; Provider Global: -- Swollen: --; Tender: -- Joint Exam 03/28/2021   No joint exam has been documented for this visit   There is currently no information documented on the homunculus. Go to the Rheumatology activity and complete the homunculus  joint exam.  Investigation: No additional findings.  Imaging: No results found.  Recent Labs: Lab Results  Component Value Date   WBC 8.0 12/16/2020   HGB 12.4 12/16/2020   PLT 224 12/16/2020   NA 140 12/16/2020   K 5.1 12/16/2020   CL 102 12/16/2020   CO2 22 12/16/2020   GLUCOSE 74 12/16/2020   BUN 11 12/16/2020   CREATININE 0.76 12/16/2020    BILITOT 0.2 12/16/2020   ALKPHOS 81 12/16/2020   AST 18 12/16/2020   ALT 13 12/16/2020   PROT 6.6 12/16/2020   ALBUMIN 3.8 12/16/2020   CALCIUM 9.4 12/16/2020   GFRAA 81 07/28/2020   QFTBGOLDPLUS Negative 12/16/2020    Speciality Comments: No specialty comments available.  Procedures:  No procedures performed Allergies: Sulfamethoxazole   Assessment / Plan:     Visit Diagnoses: No diagnosis found.  Orders: No orders of the defined types were placed in this encounter.  No orders of the defined types were placed in this encounter.   Face-to-face time spent with patient was *** minutes. Greater than 50% of time was spent in counseling and coordination of care.  Follow-Up Instructions: No follow-ups on file.   Earnestine Mealing, CMA  Note - This record has been created using Editor, commissioning.  Chart creation errors have been sought, but may not always  have been located. Such creation errors do not reflect on  the standard of medical care.

## 2021-03-16 ENCOUNTER — Ambulatory Visit: Payer: Medicare Other | Admitting: Physician Assistant

## 2021-03-16 DIAGNOSIS — I10 Essential (primary) hypertension: Secondary | ICD-10-CM | POA: Diagnosis not present

## 2021-03-16 DIAGNOSIS — E7849 Other hyperlipidemia: Secondary | ICD-10-CM | POA: Diagnosis not present

## 2021-03-16 DIAGNOSIS — M0689 Other specified rheumatoid arthritis, multiple sites: Secondary | ICD-10-CM | POA: Diagnosis not present

## 2021-03-28 ENCOUNTER — Ambulatory Visit: Payer: Medicare Other | Admitting: Physician Assistant

## 2021-03-28 NOTE — Progress Notes (Signed)
Office Visit Note  Patient: Samantha Yates             Date of Birth: Sep 05, 1961           MRN: [id number]             PCP: Sharilyn Sites, MD Referring: Sharilyn Sites, MD Visit Date: 04/11/2021 Occupation: @GUAROCC @  Subjective:  Left 4th toe pain   History of Present Illness: Samantha Yates is a 60 y.o. female with history of seropositive rheumatoid arthritis and osteoarthritis.  She is on orencia 125 mg sq injections once weekly.  Patient reports that she missed her dose of Orencia last Tuesday and plans on skipping her dose today since she will be receiving the second COVID-19 vaccine booster tomorrow.  She says she has not missed any other doses of Orencia recently.  She denies any recent rheumatoid arthritis flares.  She has been experiencing increased pain in her left fourth toe but denies any joint swelling.  She has occasional discomfort in her hands with overuse activities.  She takes Tylenol very sparingly for pain relief.    Activities of Daily Living:  Patient reports morning stiffness for 30 minutes.   Patient Reports nocturnal pain.  Difficulty dressing/grooming: Denies Difficulty climbing stairs: Denies Difficulty getting out of chair: Reports Difficulty using hands for taps, buttons, cutlery, and/or writing: Reports  Review of Systems  Constitutional:  Positive for fatigue.  HENT:  Positive for mouth dryness. Negative for mouth sores and nose dryness.   Eyes:  Positive for dryness. Negative for pain and itching.  Respiratory:  Negative for shortness of breath and difficulty breathing.   Cardiovascular:  Negative for chest pain and palpitations.  Gastrointestinal:  Negative for blood in stool, constipation and diarrhea.  Endocrine: Negative for increased urination.  Genitourinary:  Negative for difficulty urinating.  Musculoskeletal:  Positive for joint pain, joint pain and morning stiffness. Negative for joint swelling, myalgias, muscle tenderness and  myalgias.  Skin:  Negative for color change, rash and redness.  Allergic/Immunologic: Positive for susceptible to infections.  Neurological:  Negative for dizziness, numbness, headaches, memory loss and weakness.  Hematological:  Positive for bruising/bleeding tendency.  Psychiatric/Behavioral:  Negative for confusion.    PMFS History:  Patient Active Problem List   Diagnosis Date Noted   Special screening for malignant neoplasms, colon    High risk medication use 11/08/2016   Primary osteoarthritis of both hands 11/08/2016   Primary osteoarthritis of both feet 11/08/2016   History of gastroesophageal reflux (GERD) 11/08/2016   History of hypertension 11/08/2016   History of anxiety 11/08/2016   History of allergic rhinitis 11/08/2016   Former smoker 11/08/2016   GERD (gastroesophageal reflux disease) 09/17/2008   Rheumatoid arthritis ([place]) 09/17/2006   Anxiety 09/17/2006   Hypertension 09/17/2005   Allergic rhinitis 09/17/1996    Past Medical History:  Diagnosis Date   Arthritis    Hypertension    Rheumatoid arthritis ([place])     Family History  Problem Relation Age of Onset   Colon polyps Mother    Stroke Mother    Diabetes Brother    Diabetes Brother    Hypertension Daughter    Diabetes Daughter    Healthy Son    Colon cancer Cousin    Past Surgical History:  Procedure Laterality Date   COLONOSCOPY N/A 04/09/2018   Procedure: COLONOSCOPY;  Surgeon: Danie Binder, MD;  [facility];  Service: Endoscopy;  Laterality: N/A;  9:30  POLYPECTOMY  04/09/2018   Procedure: POLYPECTOMY;  Surgeon: Danie Binder, MD;  [facility];  Service: Endoscopy;;  ascending, descending colon,rectal, transverse,   TONSILLECTOMY     Social History   Social History Narrative   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   Immunization History  Administered Date(s) Administered   [employer] Vaccination 12/04/2019, 01/05/2020, 05/12/2020   Tdap  04/03/2015     Objective: Vital Signs: BP 101/70 (BP Location: Left Arm, Patient Position: Sitting, Cuff Size: Normal)   Pulse 90   Ht 5' 7.5" (1.715 m)   Wt 178 lb (80.7 kg)   BMI 27.47 kg/m    Physical Exam Vitals and nursing note reviewed.  Constitutional:      Appearance: She is well-developed.  HENT:     Head: Normocephalic and atraumatic.  Eyes:     Conjunctiva/sclera: Conjunctivae normal.  Pulmonary:     Effort: Pulmonary effort is normal.  Abdominal:     Palpations: Abdomen is soft.  Musculoskeletal:     Cervical back: Normal range of motion.  Skin:    General: Skin is warm and dry.     Capillary Refill: Capillary refill takes less than 2 seconds.  Neurological:     Mental Status: She is alert and oriented to person, place, and time.  Psychiatric:        Behavior: Behavior normal.     Musculoskeletal Exam: C-spine, thoracic spine, lumbar spine have good range of motion with no discomfort.  Shoulder joints, elbow joints, wrist joints, MCPs, PIPs, DIPs have good range of motion with no synovitis.  She has PIP and DIP thickening consistent with osteoarthritis of both hands.  Complete fist formation bilaterally.  Hip joints have good range of motion with no discomfort.  No tenderness over trochanteric bursa bilaterally.  Knee joints have good range of motion with no warmth or effusion.  Ankle joints have good range of motion with no tenderness or joint swelling.  She has tenderness over the left fourth PIP joint.  No tenderness or synovitis of MTP joints.  CDAI Exam: CDAI Score: 0.8  Patient Global: 4 mm; Provider Global: 4 mm Swollen: 0 ; Tender: 2  Joint Exam 04/11/2021      Right  Left  PIP 4 (toe)      Tender  DIP 4 (toe)      Tender     Investigation: No additional findings.  Imaging: No results found.  Recent Labs: Lab Results  Component Value Date   WBC 8.0 12/16/2020   HGB 12.4 12/16/2020   PLT 224 12/16/2020   NA 140 12/16/2020   K 5.1  12/16/2020   CL 102 12/16/2020   CO2 22 12/16/2020   GLUCOSE 74 12/16/2020   BUN 11 12/16/2020   CREATININE 0.76 12/16/2020   BILITOT 0.2 12/16/2020   ALKPHOS 81 12/16/2020   AST 18 12/16/2020   ALT 13 12/16/2020   PROT 6.6 12/16/2020   ALBUMIN 3.8 12/16/2020   CALCIUM 9.4 12/16/2020   GFRAA 81 07/28/2020   QFTBGOLDPLUS Negative 12/16/2020    Speciality Comments: No specialty comments available.  Procedures:  No procedures performed Allergies: Sulfamethoxazole   Assessment / Plan:     Visit Diagnoses: Rheumatoid arthritis involving multiple sites with positive rheumatoid factor (HCC) - +RF,+anti-CCP with nodulosis: She has no synovitis on examination today.  She has not had any recent rheumatoid arthritis flares.  She is clinically been doing well taking Orencia 125 mg subcutaneous injections once weekly.  She has been experiencing increased pain in the left fourth toe but no synovitis or dactylitis was noted on examination today.  X-rays of both hands and feet were obtained on 09/18/2017 and the images and interpretation was reviewed today in the office with the patient.  X-rays of both hands and feet will be updated today to assess for radiographic progression.  She will remain on Orencia as prescribed.  She was advised to notify us if she develops increased joint pain or joint swelling.  She will follow-up in the office in 5 months.- Plan: XR Hand 2 View Right, XR Hand 2 View Left, XR Foot 2 Views Right, XR Foot 2 Views Left  High risk medication use - Orencia Clickject 841 mg subcutaneous injection every 7 days.   CBC and CMP updated on 12/16/20.  She is due to update lab work today.  Her next labs are going to be due in October and every 3 months.  Standing orders for CBC and CMP remain in place. TB gold negative on 12/16/20. - Plan: CBC with Differential/Platelet, COMPLETE METABOLIC PANEL WITH GFR Discussed the importance of holding Orencia if she develops signs or symptoms of an  infection and to resume once infection has completely cleared.  Primary osteoarthritis of both hands: She has PIP and DIP thickening consistent with osteoarthritis of both hands.  No tenderness or inflammation was noted.  She experiences occasional discomfort and stiffness in her hands which is exacerbated by overuse activities.  We discussed the importance of joint protection and muscle strengthening.  Primary osteoarthritis of both feet: She has been experiencing increased pain in the left fourth toe over the past several months.  She has not noticed any joint swelling.  She did not have any injury prior to the onset of symptoms.  On examination she has tenderness over the left fourth PIP and DIP joint.  No synovitis or dactylitis was noted.  X-rays of both feet from 1-19 were reviewed with the patient today in the office.  X-rays of both feet were updated today.  Trochanteric bursitis of both hips - Resolved.   Medial epicondylitis of right elbow - Resolved.   Other fatigue: Stable  Other medical conditions are listed as follows:  History of anxiety  History of hypertension: Blood pressure was 101/70 today in the office.  History of gastroesophageal reflux (GERD)  History of allergic rhinitis  Former smoker  Orders: Orders Placed This Encounter  Procedures   XR Hand 2 View Right   XR Hand 2 View Left   XR Foot 2 Views Right   XR Foot 2 Views Left   CBC with Differential/Platelet   COMPLETE METABOLIC PANEL WITH GFR   No orders of the defined types were placed in this encounter.    Follow-Up Instructions: Return in about 5 months (around 09/11/2021) for Rheumatoid arthritis.   Ofilia Neas, PA-C  Note - This record has been created using Dragon software.  Chart creation errors have been sought, but may not always  have been located. Such creation errors do not reflect on  the standard of medical care.

## 2021-04-04 ENCOUNTER — Telehealth: Payer: Self-pay

## 2021-04-04 NOTE — Telephone Encounter (Signed)
Patient left a voicemail stating she is planning to get her Covid booster and requested a return call to let her know if she can continue taking her Orencia medication.

## 2021-04-04 NOTE — Telephone Encounter (Signed)
Patient advised Dr. Estanislado Pandy has been recommending to hold Orencia for the booster as well.

## 2021-04-04 NOTE — Telephone Encounter (Signed)
Attempted to contact the patient to review the following. Left message for patient to call the office.   COVID-19 vaccine recommendations:   COVID-19 vaccine is recommended for everyone (unless you are allergic to a vaccine component), even if you are on a medication that suppresses your immune system.   If you are on Methotrexate, Cellcept (mycophenolate), Rinvoq, Morrie Sheldon, and Olumiant- hold the medication for 1 week after each vaccine. Hold Methotrexate for 2 weeks after the single dose COVID-19 vaccine.   If you are on Orencia subcutaneous injection - hold medication one week prior to and one week after the first COVID-19 vaccine dose (only).   If you are on Orencia IV infusions- time vaccination administration so that the first COVID-19 vaccination will occur four weeks after the infusion and postpone the subsequent infusion by one week.   If you are on Cyclophosphamide or Rituxan infusions please contact your doctor prior to receiving the COVID-19 vaccine.   Do not take Tylenol or any anti-inflammatory medications (NSAIDs) 24 hours prior to the COVID-19 vaccination.

## 2021-04-04 NOTE — Telephone Encounter (Signed)
We have been recommending to hold Orencia for the booster as well.

## 2021-04-11 ENCOUNTER — Ambulatory Visit: Payer: Medicare Other | Admitting: Physician Assistant

## 2021-04-11 ENCOUNTER — Ambulatory Visit: Payer: Self-pay

## 2021-04-11 ENCOUNTER — Other Ambulatory Visit: Payer: Self-pay

## 2021-04-11 ENCOUNTER — Encounter: Payer: Self-pay | Admitting: Physician Assistant

## 2021-04-11 VITALS — BP 101/70 | HR 90 | Ht 67.5 in | Wt 178.0 lb

## 2021-04-11 DIAGNOSIS — M19041 Primary osteoarthritis, right hand: Secondary | ICD-10-CM

## 2021-04-11 DIAGNOSIS — M7701 Medial epicondylitis, right elbow: Secondary | ICD-10-CM | POA: Diagnosis not present

## 2021-04-11 DIAGNOSIS — Z8719 Personal history of other diseases of the digestive system: Secondary | ICD-10-CM

## 2021-04-11 DIAGNOSIS — M7062 Trochanteric bursitis, left hip: Secondary | ICD-10-CM

## 2021-04-11 DIAGNOSIS — R5383 Other fatigue: Secondary | ICD-10-CM

## 2021-04-11 DIAGNOSIS — M19071 Primary osteoarthritis, right ankle and foot: Secondary | ICD-10-CM

## 2021-04-11 DIAGNOSIS — M19072 Primary osteoarthritis, left ankle and foot: Secondary | ICD-10-CM | POA: Diagnosis not present

## 2021-04-11 DIAGNOSIS — Z87891 Personal history of nicotine dependence: Secondary | ICD-10-CM

## 2021-04-11 DIAGNOSIS — M0579 Rheumatoid arthritis with rheumatoid factor of multiple sites without organ or systems involvement: Secondary | ICD-10-CM

## 2021-04-11 DIAGNOSIS — M7061 Trochanteric bursitis, right hip: Secondary | ICD-10-CM | POA: Diagnosis not present

## 2021-04-11 DIAGNOSIS — Z8659 Personal history of other mental and behavioral disorders: Secondary | ICD-10-CM

## 2021-04-11 DIAGNOSIS — Z79899 Other long term (current) drug therapy: Secondary | ICD-10-CM

## 2021-04-11 DIAGNOSIS — M19042 Primary osteoarthritis, left hand: Secondary | ICD-10-CM | POA: Diagnosis not present

## 2021-04-11 DIAGNOSIS — Z8679 Personal history of other diseases of the circulatory system: Secondary | ICD-10-CM | POA: Diagnosis not present

## 2021-04-11 DIAGNOSIS — Z8709 Personal history of other diseases of the respiratory system: Secondary | ICD-10-CM | POA: Diagnosis not present

## 2021-04-11 NOTE — Patient Instructions (Signed)
Standing Labs We placed an order today for your standing lab work.   Please have your standing labs drawn in October and every 3 months   If possible, please have your labs drawn 2 weeks prior to your appointment so that the provider can discuss your results at your appointment.  Please note that you may see your imaging and lab results in MyChart before we have reviewed them. We may be awaiting multiple results to interpret others before contacting you. Please allow our office up to 72 hours to thoroughly review all of the results before contacting the office for clarification of your results.  We have open lab daily: Monday through Thursday from 1:30-4:30 PM and Friday from 1:30-4:00 PM at the office of Dr. Shaili Deveshwar, [facility].   Please be advised, all patients with office appointments requiring lab work will take precedent over walk-in lab work.  If possible, please come for your lab work on Monday and Friday afternoons, as you may experience shorter wait times. The office is located at [address], [address], [place], [place] [address] No appointment is necessary.   Labs are drawn by [facility]. Please bring your co-pay at the time of your lab draw.  You may receive a bill from [facility] for your lab work.  If you wish to have your labs drawn at another location, please call the office 24 hours in advance to send orders.  If you have any questions regarding directions or hours of operation,  please call [phone].   As a reminder, please drink plenty of water prior to coming for your lab work. Thanks!  

## 2021-04-11 NOTE — Progress Notes (Signed)
I called the patient to review x-ray results of both hands and both feet.  Answered all questions.  Advised patient to notify us if she develops any new or worsening symptoms.

## 2021-04-12 LAB — COMPLETE METABOLIC PANEL WITH GFR
AG Ratio: 1.6 (calc) (ref 1.0–2.5)
ALT: 13 U/L (ref 6–29)
AST: 16 U/L (ref 10–35)
Albumin: 3.9 g/dL (ref 3.6–5.1)
Alkaline phosphatase (APISO): 74 U/L (ref 37–153)
BUN: 9 mg/dL (ref 7–25)
CO2: 27 mmol/L (ref 20–32)
Calcium: 9.1 mg/dL (ref 8.6–10.4)
Chloride: 103 mmol/L (ref 98–110)
Creat: 0.88 mg/dL (ref 0.50–1.05)
Globulin: 2.4 g/dL (calc) (ref 1.9–3.7)
Glucose, Bld: 97 mg/dL (ref 65–99)
Potassium: 4.6 mmol/L (ref 3.5–5.3)
Sodium: 138 mmol/L (ref 135–146)
Total Bilirubin: 0.3 mg/dL (ref 0.2–1.2)
Total Protein: 6.3 g/dL (ref 6.1–8.1)
eGFR: 75 mL/min/{1.73_m2} (ref >=60)

## 2021-04-12 LAB — CBC WITH DIFFERENTIAL/PLATELET
Absolute Monocytes: 681 cells/uL (ref 200–950)
Basophils Absolute: 52 cells/uL (ref 0–200)
Basophils Relative: 0.7 %
Eosinophils Absolute: 118 cells/uL (ref 15–500)
Eosinophils Relative: 1.6 %
HCT: 37.7 % (ref 35.0–45.0)
Hemoglobin: 12.5 g/dL (ref 11.7–15.5)
Lymphs Abs: 3256 cells/uL (ref 850–3900)
MCH: 31.3 pg (ref 27.0–33.0)
MCHC: 33.2 g/dL (ref 32.0–36.0)
MCV: 94.5 fL (ref 80.0–100.0)
MPV: 11 fL (ref 7.5–12.5)
Monocytes Relative: 9.2 %
Neutro Abs: 3293 cells/uL (ref 1500–7800)
Neutrophils Relative %: 44.5 %
Platelets: 219 10*3/uL (ref 140–400)
RBC: 3.99 10*6/uL (ref 3.80–5.10)
RDW: 12.5 % (ref 11.0–15.0)
Total Lymphocyte: 44 %
WBC: 7.4 10*3/uL (ref 3.8–10.8)

## 2021-04-12 NOTE — Progress Notes (Signed)
CBC and CMP WNL

## 2021-04-14 ENCOUNTER — Ambulatory Visit: Payer: Medicare Other | Attending: Internal Medicine

## 2021-04-14 ENCOUNTER — Other Ambulatory Visit: Payer: Self-pay

## 2021-04-14 DIAGNOSIS — Z23 Encounter for immunization: Secondary | ICD-10-CM

## 2021-04-14 MED ORDER — MODERNA COVID-19 VACCINE 100 MCG/0.5ML IM SUSP
INTRAMUSCULAR | 0 refills | Status: DC
  Filled 2021-04-14: qty 0.25, 1d supply, fill #0

## 2021-04-14 NOTE — Progress Notes (Signed)
   Covid-19 Vaccination Clinic  Name:  Samantha Yates    MRN: [id number] DOB: [birth year]  04/14/2021  [name] was observed post Covid-19 immunization for 15 minutes without incident. She was provided with Vaccine Information Sheet and instruction to access the V-Safe system.   [name] was instructed to call 911 with any severe reactions post vaccine: Difficulty breathing  Swelling of face and throat  A fast heartbeat  A bad rash all over body  Dizziness and weakness   Immunizations Administered     Name Date Dose VIS Date Route   Moderna Covid-19 Booster Vaccine 04/14/2021  1:48 PM 0.25 mL 07/06/2020 Intramuscular   Manufacturer: Levan Hurst   Lot: [id number]   [id number]      Lu Duffel, PharmD, MBA Clinical Acute Care Pharmacist

## 2021-05-16 ENCOUNTER — Telehealth: Payer: Self-pay

## 2021-05-16 DIAGNOSIS — Z20822 Contact with and (suspected) exposure to covid-19: Secondary | ICD-10-CM | POA: Diagnosis not present

## 2021-05-16 DIAGNOSIS — Z03818 Encounter for observation for suspected exposure to other biological agents ruled out: Secondary | ICD-10-CM | POA: Diagnosis not present

## 2021-05-16 NOTE — Telephone Encounter (Signed)
Patient's son tested postive for Covid on Sunday. Patient states she has a headache and throat is scratchy. Patient states she is due to take her Orencia tonight. Please advise.

## 2021-05-16 NOTE — Telephone Encounter (Signed)
Patient advised to hold her orencia dose tonight.  Patient advised she should be evaluated by her PCP and will require covid-19 testing.  Patient advised if she is positive and symptomatic she should hold orencia for 2-3 weeks after symptoms have resolved. Patient expressed understanding.

## 2021-05-16 NOTE — Telephone Encounter (Signed)
Patient called stating she is scheduled to take her Orencia injection tonight, but was in direct contact with someone who tested positive for Covid over the weekend.  Patient states she has a headache.  Patient requested a return call.

## 2021-05-16 NOTE — Telephone Encounter (Signed)
Please advise the patient to hold her orencia dose tonight.  She should be evaluated by her PCP and will require covid-19 testing.  If she is positive and symptomatic she should hold orencia for 2-3 weeks after symptoms have resolved.

## 2021-05-16 NOTE — Telephone Encounter (Signed)
Patient advised since she has tested positive and symptomatic she should hold orencia for 2-3 weeks after symptoms have resolved. Patient expressed understanding.

## 2021-05-16 NOTE — Telephone Encounter (Signed)
Patient called to let the office know that she took a rapid test and tested positive for Covid.  Patient was confirming that she should hold her Orencia until her symptoms are gone.

## 2021-05-19 DIAGNOSIS — Z03818 Encounter for observation for suspected exposure to other biological agents ruled out: Secondary | ICD-10-CM | POA: Diagnosis not present

## 2021-05-19 DIAGNOSIS — Z20822 Contact with and (suspected) exposure to covid-19: Secondary | ICD-10-CM | POA: Diagnosis not present

## 2021-05-28 DIAGNOSIS — Z20822 Contact with and (suspected) exposure to covid-19: Secondary | ICD-10-CM | POA: Diagnosis not present

## 2021-06-23 ENCOUNTER — Other Ambulatory Visit: Payer: Self-pay | Admitting: *Deleted

## 2021-06-23 MED ORDER — ORENCIA CLICKJECT 125 MG/ML ~~LOC~~ SOAJ: SUBCUTANEOUS | 0 refills | Status: DC

## 2021-06-23 NOTE — Telephone Encounter (Signed)
Refill request received via fax  Next Visit: 09/06/2021  Last Visit: 04/11/2021  Last Fill: 03/13/2021  [id number] arthritis involving multiple sites with positive rheumatoid factor   Current Dose per office note 1/82/9937: Orencia Clickject 169 mg subcutaneous injection every 7 days.   Labs: 04/11/2021 CBC and CMP WNL  TB Gold: 12/16/2020 Neg    Okay to refill Orencia?

## 2021-07-14 DIAGNOSIS — Z23 Encounter for immunization: Secondary | ICD-10-CM | POA: Diagnosis not present

## 2021-07-19 ENCOUNTER — Telehealth: Payer: Self-pay | Admitting: Pharmacist

## 2021-07-19 DIAGNOSIS — I10 Essential (primary) hypertension: Secondary | ICD-10-CM | POA: Diagnosis not present

## 2021-07-19 DIAGNOSIS — M0689 Other specified rheumatoid arthritis, multiple sites: Secondary | ICD-10-CM | POA: Diagnosis not present

## 2021-07-19 NOTE — Telephone Encounter (Signed)
Received Orencia PAP re-enrollment application from [employer]   Will place provider portion in Hazel Sams, PA-C's, folder to be signed (along with med list, insurance card copy, PA approval letter).  Mailed patient her portion today to be completed and returned to pharmacy team with letter noting that income docs are required as well as out-of-pocket rx cost proof     Knox Saliva, PharmD, MPH, BCPS Clinical Pharmacist (Rheumatology and Pulmonology)

## 2021-07-20 NOTE — Telephone Encounter (Signed)
Received signed provider form for [employer] PAP re-enrollment application for Orencia.  Will place signed provider portion, med list, insurance card copy in "PAP pending info" folder in pharmacy office  Awaiting patient portion which is mailed out to home yesterday, 07/19/21  Knox Saliva, PharmD, MPH, BCPS Clinical Pharmacist (Rheumatology and Pulmonology)

## 2021-08-09 NOTE — Telephone Encounter (Signed)
Spoke with patient - she states she has not received application at address on file but will confirm with her mother if she received application there since that is where she receives her medication.  She will come have labs completed on 08/21/21 and will plan to bring income documents and complete Orencia PAP then if she doesn't find application at home  Knox Saliva, PharmD, MPH, BCPS Clinical Pharmacist (Rheumatology and Pulmonology)

## 2021-08-23 NOTE — Progress Notes (Deleted)
Office Visit Note  Patient: Samantha Yates             Date of Birth: 05-Dec-1960           MRN: [id number]             PCP: Sharilyn Sites, MD Referring: Sharilyn Sites, MD Visit Date: 09/06/2021 Occupation: @GUAROCC @  Subjective:  No chief complaint on file.   History of Present Illness: Samantha Yates is a 60 y.o. female ***   Activities of Daily Living:  Patient reports morning stiffness for *** {minute/hour:19697}.   Patient {ACTIONS;DENIES/REPORTS:21021675::"Denies"} nocturnal pain.  Difficulty dressing/grooming: {ACTIONS;DENIES/REPORTS:21021675::"Denies"} Difficulty climbing stairs: {ACTIONS;DENIES/REPORTS:21021675::"Denies"} Difficulty getting out of chair: {ACTIONS;DENIES/REPORTS:21021675::"Denies"} Difficulty using hands for taps, buttons, cutlery, and/or writing: {ACTIONS;DENIES/REPORTS:21021675::"Denies"}  No Rheumatology ROS completed.   PMFS History:  Patient Active Problem List   Diagnosis Date Noted   Special screening for malignant neoplasms, colon    High risk medication use 11/08/2016   Primary osteoarthritis of both hands 11/08/2016   Primary osteoarthritis of both feet 11/08/2016   History of gastroesophageal reflux (GERD) 11/08/2016   History of hypertension 11/08/2016   History of anxiety 11/08/2016   History of allergic rhinitis 11/08/2016   Former smoker 11/08/2016   GERD (gastroesophageal reflux disease) 09/17/2008   Rheumatoid arthritis ([place]) 09/17/2006   Anxiety 09/17/2006   Hypertension 09/17/2005   Allergic rhinitis 09/17/1996    Past Medical History:  Diagnosis Date   Arthritis    Hypertension    Rheumatoid arthritis ([place])     Family History  Problem Relation Age of Onset   Colon polyps Mother    Stroke Mother    Diabetes Brother    Diabetes Brother    Hypertension Daughter    Diabetes Daughter    Healthy Son    Colon cancer Cousin    Past Surgical History:  Procedure Laterality Date   COLONOSCOPY N/A 04/09/2018    Procedure: COLONOSCOPY;  Surgeon: Danie Binder, MD;  [facility];  Service: Endoscopy;  Laterality: N/A;  9:30   POLYPECTOMY  04/09/2018   Procedure: POLYPECTOMY;  Surgeon: Danie Binder, MD;  [facility];  Service: Endoscopy;;  ascending, descending colon,rectal, transverse,   TONSILLECTOMY     Social History   Social History Narrative   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   Immunization History  Administered Date(s) Administered   Medtronic Booster Vaccination 04/14/2021   Moderna Sars-Covid-2 Vaccination 12/04/2019, 01/05/2020, 05/12/2020   Tdap 04/03/2015     Objective: Vital Signs: There were no vitals taken for this visit.   Physical Exam   Musculoskeletal Exam: ***  CDAI Exam: CDAI Score: -- Patient Global: --; Provider Global: -- Swollen: --; Tender: -- Joint Exam 09/06/2021   No joint exam has been documented for this visit   There is currently no information documented on the homunculus. Go to the Rheumatology activity and complete the homunculus joint exam.  Investigation: No additional findings.  Imaging: No results found.  Recent Labs: Lab Results  Component Value Date   WBC 7.4 04/11/2021   HGB 12.5 04/11/2021   PLT 219 04/11/2021   NA 138 04/11/2021   K 4.6 04/11/2021   CL 103 04/11/2021   CO2 27 04/11/2021   GLUCOSE 97 04/11/2021   BUN 9 04/11/2021   CREATININE 0.88 04/11/2021   BILITOT 0.3 04/11/2021   ALKPHOS 81 12/16/2020   AST 16 04/11/2021   ALT 13 04/11/2021   PROT 6.3 04/11/2021  ALBUMIN 3.8 12/16/2020   CALCIUM 9.1 04/11/2021   GFRAA 81 07/28/2020   QFTBGOLDPLUS Negative 12/16/2020    Speciality Comments: No specialty comments available.  Procedures:  No procedures performed Allergies: Sulfamethoxazole   Assessment / Plan:     Visit Diagnoses: No diagnosis found.  Orders: No orders of the defined types were placed in this encounter.  No orders of the defined types were  placed in this encounter.   Face-to-face time spent with patient was *** minutes. Greater than 50% of time was spent in counseling and coordination of care.  Follow-Up Instructions: No follow-ups on file.   Earnestine Mealing, CMA  Note - This record has been created using Editor, commissioning.  Chart creation errors have been sought, but may not always  have been located. Such creation errors do not reflect on  the standard of medical care.

## 2021-08-24 NOTE — Telephone Encounter (Signed)
ATC patient to determine status of Orencia PAP renewal application - unable to reach. Left VM. Advised that she is also due for updated CBC/CMP.  Knox Saliva, PharmD, MPH, BCPS Clinical Pharmacist (Rheumatology and Pulmonology)

## 2021-08-24 NOTE — Telephone Encounter (Signed)
Patient returned call. States that she mailed her portion of application to [employer] along with income documents.  Will fax PAP provider portion to [employer] upcoming week'  Patient has also been advised to bring her portion of application to appt on 61/54/88 so that we can make a copy and place in her chart  Knox Saliva, PharmD, MPH, BCPS Clinical Pharmacist (Rheumatology and Pulmonology)

## 2021-08-28 NOTE — Telephone Encounter (Signed)
Submitted Patient Assistance RENEWAL Application to [employer] for [facility] along with provider portion, insurance card copy, and med list.  Noted on cover sheet that patient has mailed her portion of application with income documents.  Will update patient when we receive a response.  Fax# [phone] Phone# [phone]  Knox Saliva, PharmD, MPH, BCPS Clinical Pharmacist (Rheumatology and Pulmonology)

## 2021-09-04 DIAGNOSIS — Z20822 Contact with and (suspected) exposure to covid-19: Secondary | ICD-10-CM | POA: Diagnosis not present

## 2021-09-04 DIAGNOSIS — Z03818 Encounter for observation for suspected exposure to other biological agents ruled out: Secondary | ICD-10-CM | POA: Diagnosis not present

## 2021-09-05 ENCOUNTER — Telehealth: Payer: Self-pay | Admitting: *Deleted

## 2021-09-05 DIAGNOSIS — J22 Unspecified acute lower respiratory infection: Secondary | ICD-10-CM | POA: Diagnosis not present

## 2021-09-05 DIAGNOSIS — U071 COVID-19: Secondary | ICD-10-CM | POA: Diagnosis not present

## 2021-09-05 NOTE — Telephone Encounter (Signed)
Patient contacted the office stating she had a positive Covid test yesterday. Patient is on [place]. Reviewed the following with patient.   If you test POSITIVE for COVID19 and have MILD to MODERATE symptoms: First, call your PCP if you would like to receive COVID19 treatment AND Hold your medications during the infection and for at least 1 week after your symptoms have resolved: Injectable medication (Benlysta, Cimzia, Cosentyx, Enbrel, Humira, Orencia, Remicade, Simponi, Stelara, Taltz, Tremfya) Methotrexate Leflunomide (Arava) Azathioprine Mycophenolate (Cellcept) Roma Kayser, or Rinvoq Otezla If you take Actemra or Kevzara, you DO NOT need to hold these for COVID19 infection.  Patient expressed understanding.

## 2021-09-06 ENCOUNTER — Ambulatory Visit: Payer: Medicare Other | Admitting: Rheumatology

## 2021-09-06 DIAGNOSIS — Z8709 Personal history of other diseases of the respiratory system: Secondary | ICD-10-CM

## 2021-09-06 DIAGNOSIS — Z8659 Personal history of other mental and behavioral disorders: Secondary | ICD-10-CM

## 2021-09-06 DIAGNOSIS — M7701 Medial epicondylitis, right elbow: Secondary | ICD-10-CM

## 2021-09-06 DIAGNOSIS — Z79899 Other long term (current) drug therapy: Secondary | ICD-10-CM

## 2021-09-06 DIAGNOSIS — M0579 Rheumatoid arthritis with rheumatoid factor of multiple sites without organ or systems involvement: Secondary | ICD-10-CM

## 2021-09-06 DIAGNOSIS — Z8679 Personal history of other diseases of the circulatory system: Secondary | ICD-10-CM

## 2021-09-06 DIAGNOSIS — Z87891 Personal history of nicotine dependence: Secondary | ICD-10-CM

## 2021-09-06 DIAGNOSIS — M19041 Primary osteoarthritis, right hand: Secondary | ICD-10-CM

## 2021-09-06 DIAGNOSIS — Z8719 Personal history of other diseases of the digestive system: Secondary | ICD-10-CM

## 2021-09-06 DIAGNOSIS — R5383 Other fatigue: Secondary | ICD-10-CM

## 2021-09-06 DIAGNOSIS — M7061 Trochanteric bursitis, right hip: Secondary | ICD-10-CM

## 2021-09-06 DIAGNOSIS — M19071 Primary osteoarthritis, right ankle and foot: Secondary | ICD-10-CM

## 2021-09-06 NOTE — Progress Notes (Signed)
Office Visit Note  Patient: Samantha Yates             Date of Birth: 12-02-1960           MRN: [id number]             PCP: Sharilyn Sites, MD Referring: Sharilyn Sites, MD Visit Date: 09/20/2021 Occupation: '@GUAROCC' @  Subjective:  Medication monitoring   History of Present Illness: Samantha Yates is a 60 y.o. female with history of seropositive rheumatoid arthritis and osteoarthritis.  Patient is prescribed Orencia 125 mg subcutaneous injections once weekly.  She has been holding [place] for the past 3 weeks due to being diagnosed with COVID-19 on 09/03/2021.  She denies any signs or symptoms of a flare while off of Orencia.  She experiences intermittent pain in the right first MCP and both feet she denies any joint swelling.  She states that she was evaluated by her PCP after being diagnosed with COVID-19 at which time she was prescribed Paxlovid followed by Z-Pak and a prednisone taper which she has completed.  She continues to have some residual fatigue and headaches intermittently.    Activities of Daily Living:  Patient reports morning stiffness for 0 minutes.   Patient Reports nocturnal pain.  Difficulty dressing/grooming: Denies Difficulty climbing stairs: Reports Difficulty getting out of chair: Reports Difficulty using hands for taps, buttons, cutlery, and/or writing: Denies  Review of Systems  Constitutional:  Positive for fatigue.  HENT:  Positive for mouth dryness, nose dryness and sore tongue. Negative for mouth sores.   Eyes:  Positive for itching. Negative for pain and dryness.  Respiratory:  Negative for shortness of breath and difficulty breathing.   Cardiovascular:  Negative for chest pain and palpitations.  Gastrointestinal:  Negative for blood in stool, constipation and diarrhea.  Endocrine: Negative for increased urination.  Genitourinary:  Negative for difficulty urinating.  Musculoskeletal:  Positive for joint pain and joint pain. Negative for joint  swelling, myalgias, morning stiffness, muscle tenderness and myalgias.  Skin:  Negative for color change, rash and redness.  Allergic/Immunologic: Positive for susceptible to infections.  Neurological:  Positive for headaches. Negative for dizziness, numbness and memory loss.  Hematological:  Positive for bruising/bleeding tendency.  Psychiatric/Behavioral:  Negative for confusion.    PMFS History:  Patient Active Problem List   Diagnosis Date Noted   Special screening for malignant neoplasms, colon    High risk medication use 11/08/2016   Primary osteoarthritis of both hands 11/08/2016   Primary osteoarthritis of both feet 11/08/2016   History of gastroesophageal reflux (GERD) 11/08/2016   History of hypertension 11/08/2016   History of anxiety 11/08/2016   History of allergic rhinitis 11/08/2016   Former smoker 11/08/2016   GERD (gastroesophageal reflux disease) 09/17/2008   Rheumatoid arthritis ([place]) 09/17/2006   Anxiety 09/17/2006   Hypertension 09/17/2005   Allergic rhinitis 09/17/1996    Past Medical History:  Diagnosis Date   Arthritis    Hypertension    Rheumatoid arthritis ([place])     Family History  Problem Relation Age of Onset   Colon polyps Mother    Stroke Mother    Diabetes Brother    Diabetes Brother    Hypertension Daughter    Diabetes Daughter    Healthy Son    Colon cancer Cousin    Past Surgical History:  Procedure Laterality Date   COLONOSCOPY N/A 04/09/2018   Procedure: COLONOSCOPY;  Surgeon: Danie Binder, MD;  [facility];  Service:  Endoscopy;  Laterality: N/A;  9:30   POLYPECTOMY  04/09/2018   Procedure: POLYPECTOMY;  Surgeon: Danie Binder, MD;  [facility];  Service: Endoscopy;;  ascending, descending colon,rectal, transverse,   TONSILLECTOMY     Social History   Social History Narrative   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   Immunization History  Administered Date(s) Administered   Moderna  SARS-COV2 Booster Vaccination 04/14/2021   Moderna Sars-Covid-2 Vaccination 12/04/2019, 01/05/2020, 05/12/2020   Tdap 04/03/2015     Objective: Vital Signs: BP 116/78 (BP Location: Left Arm, Patient Position: Sitting, Cuff Size: Normal)    Pulse 76    Ht 5' 7.5" (1.715 m)    Wt 179 lb 9.6 oz (81.5 kg)    BMI 27.71 kg/m    Physical Exam Vitals and nursing note reviewed.  Constitutional:      Appearance: She is well-developed.  HENT:     Head: Normocephalic and atraumatic.  Eyes:     Conjunctiva/sclera: Conjunctivae normal.  Pulmonary:     Effort: Pulmonary effort is normal.  Abdominal:     Palpations: Abdomen is soft.  Musculoskeletal:     Cervical back: Normal range of motion.  Skin:    General: Skin is warm and dry.     Capillary Refill: Capillary refill takes less than 2 seconds.  Neurological:     Mental Status: She is alert and oriented to person, place, and time.  Psychiatric:        Behavior: Behavior normal.     Musculoskeletal Exam: C-spine, thoracic spine, and lumbar spine good ROM.  Shoulder joints, elbow joints, wrist joints, MCPs, PIPs, and DIPs good ROM with no synovitis.  Some synovial thickening and mild tenderness over the right 1st MCP. Complete fist formation bilaterally. Some PIP and DIP thickening consistent with OA of both hands. Hip joints, knee joints, and ankle joints good ROM with no discomfort.  No warmth or effusion of knee joints.  No tenderness or swelling of ankle joints. No tenderness over trochanteric bursa.   CDAI Exam: CDAI Score: 1.8  Patient Global: 4 mm; Provider Global: 4 mm Swollen: 0 ; Tender: 1  Joint Exam 09/20/2021      Right  Left  MCP 1   Tender        Investigation: No additional findings.  Imaging: No results found.  Recent Labs: Lab Results  Component Value Date   WBC 7.4 04/11/2021   HGB 12.5 04/11/2021   PLT 219 04/11/2021   NA 138 04/11/2021   K 4.6 04/11/2021   CL 103 04/11/2021   CO2 27 04/11/2021    GLUCOSE 97 04/11/2021   BUN 9 04/11/2021   CREATININE 0.88 04/11/2021   BILITOT 0.3 04/11/2021   ALKPHOS 81 12/16/2020   AST 16 04/11/2021   ALT 13 04/11/2021   PROT 6.3 04/11/2021   ALBUMIN 3.8 12/16/2020   CALCIUM 9.1 04/11/2021   GFRAA 81 07/28/2020   QFTBGOLDPLUS Negative 12/16/2020    Speciality Comments: No specialty comments available.  Procedures:  No procedures performed Allergies: Sulfamethoxazole   Assessment / Plan:     Visit Diagnoses: Rheumatoid arthritis involving multiple sites with positive rheumatoid factor (HCC) -  +RF,+anti-CCP with nodulosis: She has no synovitis on examination today.  She has not had any signs or symptoms of a rheumatoid arthritis flare.  She has clinically been doing well on Orencia 125 mg subcu injections once weekly.  She has been holding Orencia for the past 3 weeks due  to being diagnosed with COVID-19 on 09/03/2021.  She was prescribed Paxil bed, Z-Pak, and a prednisone taper by her PCP.  She continues to have some residual congestion, fatigue, and intermittent headaches.  She was advised to continue to hold Orencia for 1 to 2 weeks after her symptoms have completely resolved.  She voiced understanding.  She has not noticed any increased joint pain, stiffness, or inflammation since holding Orencia.  She will remain on the current treatment regimen.  No medication changes will be made at this time.  She was advised to notify us if she develops signs or symptoms of a flare.  She will follow-up in the office in 5 months.  High risk medication use - Orencia Clickject 595 mg subcutaneous injection every 7 days.  CBC and CMP updated on 04/11/2021.  She is overdue to update lab work.  Orders for CBC and CMP were released.  Her next lab work will be due in April and every 3 months to monitor for drug toxicity.  Standing orders for CBC and CMP remain in place.  TB Gold negative on 12/16/2020.  Future order for TB gold will be placed today.  - Plan: CBC with  Differential/Platelet, COMPLETE METABOLIC PANEL WITH GFR, CBC with Differential/Platelet, QuantiFERON-TB Gold Plus, CMP14+EGFR She was diagnosed with COVID-19 on 09/03/2021.  She has been holding Orencia for the past 3 weeks.  Advised to hold Orencia for 1 to 2 weeks after her symptoms have completely resolved.  She continues to have some congestion, fatigue, and intermittent headaches.  She completed a prescription for paxlovid, Z-Pak, and a prednisone taper prescribed by her PCP.   Screening for tuberculosis -Future order for TB gold was placed today.  Plan: QuantiFERON-TB Gold Plus  Primary osteoarthritis of both hands: She has some PIP and DIP thickening consistent with osteoarthritis of both hands.  No tenderness or inflammation was noted.  Complete fist formation bilaterally.  Discussed the importance of joint protection and muscle strengthening.  Primary osteoarthritis of both feet: She has good range of motion of both ankle joints with no tenderness or inflammation.  She experiences intermittent discomfort and stiffness in both feet after walking or standing for prolonged periods of time.  She wears proper fitting shoes.  Trochanteric bursitis of both hips: Resolved.  She has no tenderness palpation over the trochanteric bursa on examination today.  Medial epicondylitis of right elbow: Resolved.   Other fatigue: Exacerbated by recent covid-19 infection. Discussed the importance of gradually increasing her exercise regimen and practicing good sleep hygiene.   Other medical conditions are listed as follows:   History of anxiety  History of hypertension: BP was 116/78 today in the office.   History of gastroesophageal reflux (GERD)  History of allergic rhinitis  Former smoker    Orders: Orders Placed This Encounter  Procedures   CBC with Differential/Platelet   COMPLETE METABOLIC PANEL WITH GFR   CBC with Differential/Platelet   QuantiFERON-TB Gold Plus   CMP14+EGFR   No  orders of the defined types were placed in this encounter.  Follow-Up Instructions: Return in about 5 months (around 02/18/2022) for Rheumatoid arthritis, Osteoarthritis.   Ofilia Neas, PA-C  Note - This record has been created using Dragon software.  Chart creation errors have been sought, but may not always  have been located. Such creation errors do not reflect on  the standard of medical care.

## 2021-09-12 ENCOUNTER — Other Ambulatory Visit: Payer: Self-pay | Admitting: *Deleted

## 2021-09-12 NOTE — Telephone Encounter (Signed)
Called [employer] for update on patient's Orencia PAP renewal application - per rep they have all documents needed to process renewal but will not process until 09/17/21.  Phone: [phone]  Knox Saliva, PharmD, MPH, BCPS Clinical Pharmacist (Rheumatology and Pulmonology)

## 2021-09-12 NOTE — Telephone Encounter (Signed)
Error

## 2021-09-20 ENCOUNTER — Encounter: Payer: Self-pay | Admitting: Physician Assistant

## 2021-09-20 ENCOUNTER — Ambulatory Visit: Payer: Medicare Other | Admitting: Physician Assistant

## 2021-09-20 ENCOUNTER — Other Ambulatory Visit: Payer: Self-pay

## 2021-09-20 VITALS — BP 116/78 | HR 76 | Ht 67.5 in | Wt 179.6 lb

## 2021-09-20 DIAGNOSIS — M7701 Medial epicondylitis, right elbow: Secondary | ICD-10-CM

## 2021-09-20 DIAGNOSIS — Z87891 Personal history of nicotine dependence: Secondary | ICD-10-CM

## 2021-09-20 DIAGNOSIS — M19041 Primary osteoarthritis, right hand: Secondary | ICD-10-CM | POA: Diagnosis not present

## 2021-09-20 DIAGNOSIS — M19072 Primary osteoarthritis, left ankle and foot: Secondary | ICD-10-CM

## 2021-09-20 DIAGNOSIS — Z8659 Personal history of other mental and behavioral disorders: Secondary | ICD-10-CM

## 2021-09-20 DIAGNOSIS — Z8679 Personal history of other diseases of the circulatory system: Secondary | ICD-10-CM

## 2021-09-20 DIAGNOSIS — M7061 Trochanteric bursitis, right hip: Secondary | ICD-10-CM

## 2021-09-20 DIAGNOSIS — M19071 Primary osteoarthritis, right ankle and foot: Secondary | ICD-10-CM | POA: Diagnosis not present

## 2021-09-20 DIAGNOSIS — R5383 Other fatigue: Secondary | ICD-10-CM

## 2021-09-20 DIAGNOSIS — Z8719 Personal history of other diseases of the digestive system: Secondary | ICD-10-CM

## 2021-09-20 DIAGNOSIS — M0579 Rheumatoid arthritis with rheumatoid factor of multiple sites without organ or systems involvement: Secondary | ICD-10-CM | POA: Diagnosis not present

## 2021-09-20 DIAGNOSIS — Z79899 Other long term (current) drug therapy: Secondary | ICD-10-CM

## 2021-09-20 DIAGNOSIS — Z111 Encounter for screening for respiratory tuberculosis: Secondary | ICD-10-CM

## 2021-09-20 DIAGNOSIS — M7062 Trochanteric bursitis, left hip: Secondary | ICD-10-CM

## 2021-09-20 DIAGNOSIS — Z8709 Personal history of other diseases of the respiratory system: Secondary | ICD-10-CM

## 2021-09-20 DIAGNOSIS — M19042 Primary osteoarthritis, left hand: Secondary | ICD-10-CM

## 2021-09-20 NOTE — Patient Instructions (Signed)
Standing Labs We placed an order today for your standing lab work.   Please have your standing labs drawn in April and every 3 months   If possible, please have your labs drawn 2 weeks prior to your appointment so that the provider can discuss your results at your appointment.  Please note that you may see your imaging and lab results in [place] before we have reviewed them. We may be awaiting multiple results to interpret others before contacting you. Please allow our office up to 72 hours to thoroughly review all of the results before contacting the office for clarification of your results.  We have open lab daily: Monday through Thursday from 1:30-4:30 PM and Friday from 1:30-4:00 PM at the office of Dr. Bo Merino, [facility].   Please be advised, all patients with office appointments requiring lab work will take precedent over walk-in lab work.  If possible, please come for your lab work on Monday and Friday afternoons, as you may experience shorter wait times. The office is located at [address], [place], [place], [place] [address] No appointment is necessary.   Labs are drawn by [facility]. Please bring your co-pay at the time of your lab draw.  You may receive a bill from [facility] for your lab work.  If you wish to have your labs drawn at another location, please call the office 24 hours in advance to send orders.  If you have any questions regarding directions or hours of operation,  please call [phone].   As a reminder, please drink plenty of water prior to coming for your lab work. Thanks!  If you have signs or symptoms of an infection or start antibiotics: First, call your PCP for workup of your infection. Hold your medication through the infection, until you complete your antibiotics, and until symptoms resolve if you take the following: Injectable medication (Actemra, Benlysta, Cimzia, Cosentyx, Enbrel, Humira, Kevzara, Orencia, Remicade, Simponi,  Stelara, Taltz, Tremfya) Methotrexate Leflunomide (Arava) Mycophenolate (Cellcept) Morrie Sheldon, Olumiant, or Rinvoq

## 2021-09-21 DIAGNOSIS — H2513 Age-related nuclear cataract, bilateral: Secondary | ICD-10-CM | POA: Diagnosis not present

## 2021-09-21 LAB — CBC WITH DIFFERENTIAL/PLATELET
Absolute Monocytes: 743 cells/uL (ref 200–950)
Basophils Absolute: 28 cells/uL (ref 0–200)
Basophils Relative: 0.3 %
Eosinophils Absolute: 141 cells/uL (ref 15–500)
Eosinophils Relative: 1.5 %
HCT: 38.1 % (ref 35.0–45.0)
Hemoglobin: 12.5 g/dL (ref 11.7–15.5)
Lymphs Abs: 3111 cells/uL (ref 850–3900)
MCH: 31.1 pg (ref 27.0–33.0)
MCHC: 32.8 g/dL (ref 32.0–36.0)
MCV: 94.8 fL (ref 80.0–100.0)
MPV: 11.3 fL (ref 7.5–12.5)
Monocytes Relative: 7.9 %
Neutro Abs: 5377 cells/uL (ref 1500–7800)
Neutrophils Relative %: 57.2 %
Platelets: 209 10*3/uL (ref 140–400)
RBC: 4.02 10*6/uL (ref 3.80–5.10)
RDW: 13.6 % (ref 11.0–15.0)
Total Lymphocyte: 33.1 %
WBC: 9.4 10*3/uL (ref 3.8–10.8)

## 2021-09-21 LAB — COMPLETE METABOLIC PANEL WITH GFR
AG Ratio: 1.7 (calc) (ref 1.0–2.5)
ALT: 14 U/L (ref 6–29)
AST: 14 U/L (ref 10–35)
Albumin: 3.9 g/dL (ref 3.6–5.1)
Alkaline phosphatase (APISO): 60 U/L (ref 37–153)
BUN: 21 mg/dL (ref 7–25)
CO2: 28 mmol/L (ref 20–32)
Calcium: 9 mg/dL (ref 8.6–10.4)
Chloride: 101 mmol/L (ref 98–110)
Creat: 0.88 mg/dL (ref 0.50–1.05)
Globulin: 2.3 g/dL (calc) (ref 1.9–3.7)
Glucose, Bld: 91 mg/dL (ref 65–99)
Potassium: 4.7 mmol/L (ref 3.5–5.3)
Sodium: 136 mmol/L (ref 135–146)
Total Bilirubin: 0.4 mg/dL (ref 0.2–1.2)
Total Protein: 6.2 g/dL (ref 6.1–8.1)
eGFR: 74 mL/min/{1.73_m2} (ref >=60)

## 2021-09-21 NOTE — Progress Notes (Signed)
CMP WNL

## 2021-09-21 NOTE — Progress Notes (Signed)
CBC WNL

## 2021-10-02 NOTE — Telephone Encounter (Signed)
Received a fax from  [place] regarding an approval for [facility] patient assistance from 10/01/21 to 09/16/22.   Phone: [phone]  Knox Saliva, PharmD, MPH, BCPS Clinical Pharmacist (Rheumatology and Pulmonology)

## 2021-12-29 ENCOUNTER — Other Ambulatory Visit: Payer: Self-pay

## 2021-12-29 MED ORDER — PREDNISONE 5 MG PO TABS: ORAL_TABLET | ORAL | 0 refills | Status: DC

## 2021-12-29 NOTE — Telephone Encounter (Signed)
Patient called stating her right elbow is swollen, warm to touch, and painful when she tries to bend or straighten it.  Patient states her right thumb and middle finger are also swollen.  Patient requested a prescription of Prednisone to be sent to: ?[facility] ?[place], [place]  ?Phone [phone] ?

## 2021-12-29 NOTE — Telephone Encounter (Signed)
I returned patient's call.  Patient states that she had no injury to her elbow.  She is unable to extend her elbow fully.  Her right thumb and right middle finger are also swollen.  She denies any history of fever.  She requested prednisone taper.  Please send a prescription for prednisone 5 mg tablets starting at 20 mg p.o. daily and taper by 5 mg every 4 days.

## 2022-01-01 ENCOUNTER — Other Ambulatory Visit: Payer: Self-pay | Admitting: *Deleted

## 2022-01-01 DIAGNOSIS — Z111 Encounter for screening for respiratory tuberculosis: Secondary | ICD-10-CM

## 2022-01-01 DIAGNOSIS — Z79899 Other long term (current) drug therapy: Secondary | ICD-10-CM

## 2022-01-01 MED ORDER — ORENCIA CLICKJECT 125 MG/ML ~~LOC~~ SOAJ: SUBCUTANEOUS | 0 refills | Status: DC

## 2022-01-01 NOTE — Telephone Encounter (Signed)
Refill request received via fax ? ?Next Visit: 02/19/2022 ? ?Last Visit: 09/20/2021 ? ?Last Fill: 06/23/2021 ? ?[id number] arthritis involving multiple sites with positive rheumatoid factor  ? ?Current Dose per office note 04/18/5188: Orencia Clickject 842 mg subcutaneous injection every 7 days. ? ?Labs: 09/20/2021 CMP WNL CBC WNL  ? ?TB Gold: 12/16/2020 Neg   ? ?Patient advised she is due to update labs. Lab orders released and patient states she will go tomorrow.  ? ?Okay to refill Orencia?  ?

## 2022-01-02 DIAGNOSIS — Z79899 Other long term (current) drug therapy: Secondary | ICD-10-CM | POA: Diagnosis not present

## 2022-01-03 NOTE — Progress Notes (Signed)
CBC WNL.  Glucose is 116. Rest of CMP WNL.  TB gold pending.

## 2022-01-07 LAB — CMP14+EGFR
ALT: 14 IU/L (ref 0–32)
AST: 16 IU/L (ref 0–40)
Albumin/Globulin Ratio: 2 (ref 1.2–2.2)
Albumin: 4.3 g/dL (ref 3.8–4.8)
Alkaline Phosphatase: 70 IU/L (ref 44–121)
BUN/Creatinine Ratio: 21 (ref 12–28)
BUN: 18 mg/dL (ref 8–27)
Bilirubin Total: 0.2 mg/dL (ref 0.0–1.2)
CO2: 20 mmol/L (ref 20–29)
Calcium: 9.2 mg/dL (ref 8.7–10.3)
Chloride: 103 mmol/L (ref 96–106)
Creatinine, Ser: 0.84 mg/dL (ref 0.57–1.00)
Globulin, Total: 2.2 g/dL (ref 1.5–4.5)
Glucose: 116 mg/dL — ABNORMAL HIGH (ref 70–99)
Potassium: 4.8 mmol/L (ref 3.5–5.2)
Sodium: 139 mmol/L (ref 134–144)
Total Protein: 6.5 g/dL (ref 6.0–8.5)
eGFR: 79 mL/min/{1.73_m2} (ref >59)

## 2022-01-07 LAB — CBC WITH DIFFERENTIAL/PLATELET
Basophils Absolute: 0 10*3/uL (ref 0.0–0.2)
Basos: 0 %
EOS (ABSOLUTE): 0 10*3/uL (ref 0.0–0.4)
Eos: 0 %
Hematocrit: 35.5 % (ref 34.0–46.6)
Hemoglobin: 12.1 g/dL (ref 11.1–15.9)
Immature Grans (Abs): 0 10*3/uL (ref 0.0–0.1)
Immature Granulocytes: 1 %
Lymphocytes Absolute: 1.9 10*3/uL (ref 0.7–3.1)
Lymphs: 26 %
MCH: 31.2 pg (ref 26.6–33.0)
MCHC: 34.1 g/dL (ref 31.5–35.7)
MCV: 92 fL (ref 79–97)
Monocytes Absolute: 0.1 10*3/uL (ref 0.1–0.9)
Monocytes: 2 %
Neutrophils Absolute: 5.4 10*3/uL (ref 1.4–7.0)
Neutrophils: 71 %
RBC: 3.88 x10E6/uL (ref 3.77–5.28)
RDW: 12.3 % (ref 11.7–15.4)
WBC: 7.5 10*3/uL (ref 3.4–10.8)

## 2022-01-07 LAB — QUANTIFERON-TB GOLD PLUS
QuantiFERON Mitogen Value: >10 IU/mL
QuantiFERON Nil Value: 0.05 IU/mL
QuantiFERON TB1 Ag Value: 0.03 IU/mL
QuantiFERON TB2 Ag Value: 0.04 IU/mL
QuantiFERON-TB Gold Plus: NEGATIVE

## 2022-01-08 NOTE — Progress Notes (Signed)
TB gold negative

## 2022-02-01 ENCOUNTER — Ambulatory Visit
Admission: EM | Admit: 2022-02-01 | Discharge: 2022-02-01 | Disposition: A | Discharge status: Home / Self Care | Payer: Medicare Other | Attending: Emergency Medicine | Admitting: Emergency Medicine

## 2022-02-01 ENCOUNTER — Encounter: Payer: Self-pay | Admitting: Emergency Medicine

## 2022-02-01 DIAGNOSIS — R350 Frequency of micturition: Secondary | ICD-10-CM | POA: Insufficient documentation

## 2022-02-01 LAB — POCT URINALYSIS DIP (MANUAL ENTRY)
Bilirubin, UA: NEGATIVE
Glucose, UA: NEGATIVE mg/dL
Ketones, POC UA: NEGATIVE mg/dL
Nitrite, UA: NEGATIVE
Spec Grav, UA: 1.02 (ref 1.010–1.025)
Urobilinogen, UA: 1 E.U./dL
pH, UA: 6.5 (ref 5.0–8.0)

## 2022-02-01 MED ORDER — NITROFURANTOIN MONOHYD MACRO 100 MG PO CAPS: 100.0000 mg | ORAL_CAPSULE | Freq: Two times a day (BID) | ORAL | 0 refills | Status: DC

## 2022-02-01 NOTE — ED Triage Notes (Signed)
Pt presents with dysuria and urinary urgency started today.

## 2022-02-01 NOTE — Discharge Instructions (Signed)
Your urinalysis shows Samantha Yates blood cells but did not show bacteria therefore your urine will be sent to the lab to determine exactly which bacteria is present, if any changes need to be made to your medications you will be notified  Begin use of nitrofurantoin twice daily for 5 days  You may use over-the-counter Azo to help minimize your symptoms until antibiotic removes bacteria, this medication will turn your urine orange  Increase your fluid intake through use of water  As always practice good hygiene, wiping front to back and avoidance of scented vaginal products to prevent further irritation  If symptoms continue to persist after use of medication or recur please follow-up with urgent care or your primary doctor as needed

## 2022-02-04 LAB — URINE CULTURE: Culture: >=100000 — AB

## 2022-02-05 NOTE — ED Provider Notes (Signed)
[facility]    CSN: [id number] Arrival date & time: 02/01/22  1907      History   Chief Complaint Chief Complaint  Patient presents with   Dysuria   Urinary Frequency    HPI Samantha Yates is a 61 y.o. female.   Presents with dysuria, urinary frequency and urgency beginning 1 day ago.  Has not attempted treatment of symptoms.  Denies fever, chills, hematuria, lower abdominal pain or pressure, flank pain, vaginal discharge, itching or odor.  Past Medical History:  Diagnosis Date   Arthritis    Hypertension    Rheumatoid arthritis [facility])     Patient Active Problem List   Diagnosis Date Noted   Special screening for malignant neoplasms, colon    High risk medication use 11/08/2016   Primary osteoarthritis of both hands 11/08/2016   Primary osteoarthritis of both feet 11/08/2016   History of gastroesophageal reflux (GERD) 11/08/2016   History of hypertension 11/08/2016   History of anxiety 11/08/2016   History of allergic rhinitis 11/08/2016   Former smoker 11/08/2016   GERD (gastroesophageal reflux disease) 09/17/2008   Rheumatoid arthritis ([place]) 09/17/2006   Anxiety 09/17/2006   Hypertension 09/17/2005   Allergic rhinitis 09/17/1996    Past Surgical History:  Procedure Laterality Date   COLONOSCOPY N/A 04/09/2018   Procedure: COLONOSCOPY;  Surgeon: Danie Binder, MD;  [facility];  Service: Endoscopy;  Laterality: N/A;  9:30   POLYPECTOMY  04/09/2018   Procedure: POLYPECTOMY;  Surgeon: Danie Binder, MD;  [facility];  Service: Endoscopy;;  ascending, descending colon,rectal, transverse,   TONSILLECTOMY      OB History   No obstetric history on file.      Home Medications    Prior to Admission medications   Medication Sig Start Date End Date Taking? Authorizing Provider  nitrofurantoin, macrocrystal-monohydrate, (MACROBID) 100 MG capsule Take 1 capsule (100 mg total) by mouth 2 (two) times daily. 02/01/22  Yes  Zyon Grout R, NP  predniSONE (DELTASONE) 5 MG tablet Take 4 tabs po x 4 days, 3  tabs po x 4 days, 2  tabs po x 4 days, 1  tab po x 4 days 12/29/21   Bo Merino, MD  Abatacept (ORENCIA CLICKJECT) 867 MG/ML SOAJ Inject 125 mg subcutaneously every 7 days. 01/01/22   Ofilia Neas, PA-C  acetaminophen (TYLENOL) 650 MG CR tablet Take 650 mg by mouth every 8 (eight) hours as needed for pain.    [provider]  Ascorbic Acid (VITAMIN C PO) Take by mouth daily. IMMUNE BOOSTER    [provider]  B Complex Vitamins (B COMPLEX PO) Take by mouth daily. Patient not taking: Reported on 09/20/2021    [provider]  citalopram (CELEXA) 40 MG tablet Take 1 tablet (40 mg total) by mouth at bedtime. 06/20/12   Zenia Resides, MD  COVID-19 mRNA vaccine, Moderna, (MODERNA COVID-19 VACCINE) 100 MCG/0.5ML injection Inject into the muscle. Patient not taking: Reported on 09/20/2021 04/14/21   Carlyle Basques, MD  enalapril (VASOTEC) 10 MG tablet Take 10 mg by mouth daily.    [provider]  fluticasone (FLONASE) 50 MCG/ACT nasal spray Place 2 sprays into both nostrils as needed.     [provider]  folic acid (FOLVITE) 1 MG tablet Take 1 mg by mouth daily.    [provider]  montelukast (SINGULAIR) 10 MG tablet Take 10 mg by mouth as needed.     [provider]  omeprazole (PRILOSEC) 40 MG capsule Take 40 mg by mouth daily.    [provider]    Family History Family History  Problem Relation Age of Onset   Colon polyps Mother    Stroke Mother    Diabetes Brother    Diabetes Brother    Hypertension Daughter    Diabetes Daughter    Healthy Son    Colon cancer Cousin     Social History Social History   Tobacco Use   Smoking status: Former    Packs/day: 0.20    Years: 12.00    Pack years: 2.40    Types: Cigarettes    Start date: 09/17/1986    Quit date: 09/17/1998    Years since quitting: 23.4   Smokeless tobacco:  Never  Vaping Use   Vaping Use: Never used  Substance Use Topics   Alcohol use: Yes    Comment: rarely   Drug use: No     Allergies   Sulfamethoxazole   Review of Systems Review of Systems  Constitutional: Negative.   Respiratory: Negative.    Cardiovascular: Negative.   Genitourinary:  Positive for dysuria, frequency and urgency. Negative for decreased urine volume, difficulty urinating, dyspareunia, enuresis, flank pain, genital sores, hematuria, menstrual problem, pelvic pain, vaginal bleeding, vaginal discharge and vaginal pain.  Skin: Negative.     Physical Exam Triage Vital Signs ED Triage Vitals  Enc Vitals Group     BP 02/01/22 1927 121/70     Pulse Rate 02/01/22 1927 80     Resp 02/01/22 1927 18     Temp 02/01/22 1927 98.2 F (36.8 C)     Temp Source 02/01/22 1927 Oral     SpO2 02/01/22 1927 100 %     Weight --      Height --      Head Circumference --      Peak Flow --      Pain Score 02/01/22 1926 0     Pain Loc --      Pain Edu? --      Excl. in [place]? --    No data found.  Updated Vital Signs BP 121/70 (BP Location: Left Arm)   Pulse 80   Temp 98.2 F (36.8 C) (Oral)   Resp 18   SpO2 100%   Visual Acuity Right Eye Distance:   Left Eye Distance:   Bilateral Distance:    Right Eye Near:   Left Eye Near:    Bilateral Near:     Physical Exam Constitutional:      Appearance: Normal appearance.  Eyes:     Extraocular Movements: Extraocular movements intact.  Pulmonary:     Effort: Pulmonary effort is normal.  Abdominal:     General: Abdomen is flat. Bowel sounds are normal. There is no distension.     Palpations: Abdomen is soft.     Tenderness: There is no abdominal tenderness. There is no right CVA tenderness or left CVA tenderness.  Neurological:     Mental Status: She is alert and oriented to person, place, and time. Mental status is at baseline.  Psychiatric:        Mood and Affect: Mood normal.        Behavior: Behavior normal.      UC Treatments / Results  Labs (all labs ordered are listed, but only abnormal results are displayed) Labs Reviewed  URINE CULTURE - Abnormal; Notable for the following components:  Result Value   Culture >=100,000 COLONIES/mL CITROBACTER BRAAKII (*)    Organism ID, Bacteria CITROBACTER BRAAKII (*)    All other components within normal limits  POCT URINALYSIS DIP (MANUAL ENTRY) - Abnormal; Notable for the following components:   Samantha, UA cloudy (*)    Blood, UA moderate (*)    Protein Ur, POC trace (*)    Leukocytes, UA Moderate (2+) (*)    All other components within normal limits    EKG   Radiology No results found.  Procedures Procedures (including critical care time)  Medications Ordered in UC Medications - No data to display  Initial Impression / Assessment and Plan / UC Course  I have reviewed the triage vital signs and the nursing notes.  Pertinent labs & imaging results that were available during my care of the patient were reviewed by me and considered in my medical decision making (see chart for details).  Urinary frequency  Urinalysis showing Willella Harding blood cells, negative for nitrates, sent for culture, discussed with patient, declined testing for yeast and BV, will prophylactically begin treatment, Macrobid 5-day course prescribed, confirmed allergy to sulfa, recommended over-the-counter Azo, increase fluid intake and good hygiene as additional support, given strict precautions for persisting symptoms to follow-up with urgent care or PCP for reevaluation Final Clinical Impressions(s) / UC Diagnoses   Final diagnoses:  Urinary frequency     Discharge Instructions      Your urinalysis shows Trica Usery blood cells but did not show bacteria therefore your urine will be sent to the lab to determine exactly which bacteria is present, if any changes need to be made to your medications you will be notified  Begin use of nitrofurantoin twice daily for 5  days  You may use over-the-counter Azo to help minimize your symptoms until antibiotic removes bacteria, this medication will turn your urine orange  Increase your fluid intake through use of water  As always practice good hygiene, wiping front to back and avoidance of scented vaginal products to prevent further irritation  If symptoms continue to persist after use of medication or recur please follow-up with urgent care or your primary doctor as needed    ED Prescriptions     Medication Sig Dispense Auth. Provider   nitrofurantoin, macrocrystal-monohydrate, (MACROBID) 100 MG capsule Take 1 capsule (100 mg total) by mouth 2 (two) times daily. 10 capsule Hans Eden, NP      PDMP not reviewed this encounter.   Hans Eden, [place] 02/05/22 [phone]

## 2022-02-05 NOTE — Progress Notes (Signed)
Office Visit Note  Patient: Samantha Yates             Date of Birth: 1961-07-06           MRN: [id number]             PCP: Sharilyn Sites, MD Referring: Sharilyn Sites, MD Visit Date: 02/19/2022 Occupation: '@GUAROCC'$ @  Subjective:  Medication monitoring   History of Present Illness: Samantha Yates is a 61 y.o. female with history of seropositive rheumatoid arthritis and osteoarthritis.  She remains on Orencia 125 mg sq injections once weekly.  She continues to tolerate her Orencia without any side effects and has not missed any doses recently.  She states in April she had a flare in her right elbow and right hand.  She states that her symptoms resolved after taking a course of prednisone and has not recurred.  She is not currently experiencing any joint swelling.  She has joint stiffness especially after riding in the car for prolonged periods of time or rising from a seated position. She denies any new medical conditions.  She recently had a UTI which was treated with Macrobid.  She has not been experiencing recurrent UTIs recently.    Activities of Daily Living:  Patient reports morning stiffness for 30 minutes.   Patient Reports nocturnal pain.  Difficulty dressing/grooming: Denies Difficulty climbing stairs: Reports Difficulty getting out of chair: Reports Difficulty using hands for taps, buttons, cutlery, and/or writing: Denies  Review of Systems  Constitutional:  Positive for fatigue.  HENT:  Positive for mouth dryness. Negative for mouth sores and nose dryness.   Eyes:  Negative for pain, visual disturbance and dryness.  Respiratory:  Negative for cough, hemoptysis, shortness of breath and difficulty breathing.   Cardiovascular:  Positive for swelling in legs/feet. Negative for chest pain, palpitations and hypertension.  Gastrointestinal:  Negative for blood in stool, constipation and diarrhea.  Endocrine: Positive for heat intolerance. Negative for increased urination.   Genitourinary:  Negative for difficulty urinating and painful urination.  Musculoskeletal:  Positive for joint pain, joint pain, joint swelling and morning stiffness. Negative for myalgias, muscle weakness, muscle tenderness and myalgias.  Skin:  Negative for color change, pallor, rash, hair loss, nodules/bumps, skin tightness, ulcers and sensitivity to sunlight.  Allergic/Immunologic: Positive for susceptible to infections.  Neurological:  Negative for dizziness, numbness, headaches and weakness.  Hematological:  Positive for bruising/bleeding tendency. Negative for swollen glands.  Psychiatric/Behavioral:  Positive for sleep disturbance. Negative for depressed mood. The patient is not nervous/anxious.    PMFS History:  Patient Active Problem List   Diagnosis Date Noted   Special screening for malignant neoplasms, colon    High risk medication use 11/08/2016   Primary osteoarthritis of both hands 11/08/2016   Primary osteoarthritis of both feet 11/08/2016   History of gastroesophageal reflux (GERD) 11/08/2016   History of hypertension 11/08/2016   History of anxiety 11/08/2016   History of allergic rhinitis 11/08/2016   Former smoker 11/08/2016   GERD (gastroesophageal reflux disease) 09/17/2008   Rheumatoid arthritis ([place]) 09/17/2006   Anxiety 09/17/2006   Hypertension 09/17/2005   Allergic rhinitis 09/17/1996    Past Medical History:  Diagnosis Date   Arthritis    Hypertension    Rheumatoid arthritis ([place])     Family History  Problem Relation Age of Onset   Colon polyps Mother    Stroke Mother    Diabetes Brother    Diabetes Brother    Hypertension Daughter  Diabetes Daughter    Healthy Son    Colon cancer Cousin    Past Surgical History:  Procedure Laterality Date   COLONOSCOPY N/A 04/09/2018   Procedure: COLONOSCOPY;  Surgeon: Danie Binder, MD;  [facility];  Service: Endoscopy;  Laterality: N/A;  9:30   POLYPECTOMY  04/09/2018   Procedure:  POLYPECTOMY;  Surgeon: Danie Binder, MD;  [facility];  Service: Endoscopy;;  ascending, descending colon,rectal, transverse,   TONSILLECTOMY     Social History   Social History Narrative   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   Immunization History  Administered Date(s) Administered   Moderna SARS-COV2 Booster Vaccination 04/14/2021   Moderna Sars-Covid-2 Vaccination 12/04/2019, 01/05/2020, 05/12/2020   Tdap 04/03/2015     Objective: Vital Signs: BP 100/66 (BP Location: Left Arm, Patient Position: Sitting, Cuff Size: Normal)   Pulse 93   Resp 16   Ht '5\' 8"'$  (1.727 m)   Wt 184 lb (83.5 kg)   BMI 27.98 kg/m    Physical Exam Vitals and nursing note reviewed.  Constitutional:      Appearance: She is well-developed.  HENT:     Head: Normocephalic and atraumatic.  Eyes:     Conjunctiva/sclera: Conjunctivae normal.  Cardiovascular:     Rate and Rhythm: Normal rate and regular rhythm.     Heart sounds: Normal heart sounds.  Pulmonary:     Effort: Pulmonary effort is normal.     Breath sounds: Normal breath sounds.  Abdominal:     General: Bowel sounds are normal.     Palpations: Abdomen is soft.  Musculoskeletal:     Cervical back: Normal range of motion.  Skin:    General: Skin is warm and dry.     Capillary Refill: Capillary refill takes less than 2 seconds.  Neurological:     Mental Status: She is alert and oriented to person, place, and time.  Psychiatric:        Behavior: Behavior normal.     Musculoskeletal Exam: C-spine, thoracic spine, and lumbar spine good ROM.  No midline spinal tenderness or SI joint tenderness.  Shoulder joints, elbow joints, wrist joints, MCPs, PIPs, and DIPs good ROM with no synovitis.  Some tenderness over the right first MCP joint.  PIP and DIP thickening consistent with osteoarthritis of both hands.  Complete fist formation bilaterally.  Hip joints, knee joints, and ankle joints good ROM with no discomfort.  No  warmth or effusion of the joints noted.  No tenderness or swelling of ankle joints.  No tenderness over MTP joints.  No evidence of Achilles tendinitis or plantar fasciitis.  CDAI Exam: CDAI Score: 0.4  Patient Global: 2 mm; Provider Global: 2 mm Swollen: 0 ; Tender: 0  Joint Exam 02/19/2022   No joint exam has been documented for this visit   There is currently no information documented on the homunculus. Go to the Rheumatology activity and complete the homunculus joint exam.  Investigation: No additional findings.  Imaging: No results found.  Recent Labs: Lab Results  Component Value Date   WBC 7.5 01/02/2022   HGB 12.1 01/02/2022   PLT CANCELED 01/02/2022   NA 139 01/02/2022   K 4.8 01/02/2022   CL 103 01/02/2022   CO2 20 01/02/2022   GLUCOSE 116 (H) 01/02/2022   BUN 18 01/02/2022   CREATININE 0.84 01/02/2022   BILITOT 0.2 01/02/2022   ALKPHOS 70 01/02/2022   AST 16 01/02/2022   ALT 14 01/02/2022  PROT 6.5 01/02/2022   ALBUMIN 4.3 01/02/2022   CALCIUM 9.2 01/02/2022   GFRAA 81 07/28/2020   QFTBGOLDPLUS Negative 01/02/2022    Speciality Comments: No specialty comments available.  Procedures:  No procedures performed Allergies: Sulfamethoxazole   Assessment / Plan:     Visit Diagnoses: Rheumatoid arthritis involving multiple sites with positive rheumatoid factor (HCC) -  +RF,+anti-CCP with nodulosis: She has no synovitis on examination today.  She had a flare in her right elbow and right hand in April which resolved after taking a prednisone taper.  Overall her rheumatoid arthritis remains well controlled on Orencia 125 mg subcutaneous injections once weekly.  She continues to tolerate Orencia without any side effects or recurrent infections.  She continues to experience generalized joint stiffness especially after sitting for prolonged periods of time but has no active inflammation at this time.  She will remain on Orencia as prescribed.  She was advised to notify  us if she develops increased joint pain or joint swelling.  She will follow-up in the office in 5 months or sooner if needed.  High risk medication use - Orencia Clickject 063 mg subcutaneous injection every 7 days.  CBC and CMP were drawn on 01/02/2022.  Her next lab work will be due in July and every 3 months to monitor for drug toxicity.  Standing orders for CBC and CMP remain in place.   TB Gold negative on 01/02/22.  Discussed the importance of holding Orencia if she develops signs or symptoms of infection and to resume once the infection is completely cleared.  Primary osteoarthritis of both hands: She has PIP and DIP thickening consistent with osteoarthritis of both hands.  No tenderness or inflammation was noted.  Complete fist formation bilaterally.  Primary osteoarthritis of both feet: She has some PIP and DIP thickening consistent with osteoarthritis of both feet.  No tenderness or inflammation was noted.  She is wearing proper fitting shoes.  Trochanteric bursitis of both hips - Resolved.   Medial epicondylitis of right elbow: Resolved  Other fatigue: Stable  History of hypertension: Blood pressure was 100/66 today in the office.  Other medical conditions are listed as follows:  History of anxiety  History of gastroesophageal reflux (GERD)  History of allergic rhinitis  Former smoker  Orders: No orders of the defined types were placed in this encounter.  No orders of the defined types were placed in this encounter.    Follow-Up Instructions: Return in about 5 months (around 07/22/2022) for Rheumatoid arthritis, Osteoarthritis.   Ofilia Neas, PA-C  Note - This record has been created using Dragon software.  Chart creation errors have been sought, but may not always  have been located. Such creation errors do not reflect on  the standard of medical care.

## 2022-02-19 ENCOUNTER — Ambulatory Visit: Payer: Medicare Other | Admitting: Physician Assistant

## 2022-02-19 ENCOUNTER — Ambulatory Visit: Payer: Medicare Other | Admitting: Rheumatology

## 2022-02-19 ENCOUNTER — Encounter: Payer: Self-pay | Admitting: Physician Assistant

## 2022-02-19 VITALS — BP 100/66 | HR 93 | Resp 16 | Ht 68.0 in | Wt 184.0 lb

## 2022-02-19 DIAGNOSIS — Z8709 Personal history of other diseases of the respiratory system: Secondary | ICD-10-CM | POA: Diagnosis not present

## 2022-02-19 DIAGNOSIS — Z79899 Other long term (current) drug therapy: Secondary | ICD-10-CM

## 2022-02-19 DIAGNOSIS — Z8659 Personal history of other mental and behavioral disorders: Secondary | ICD-10-CM

## 2022-02-19 DIAGNOSIS — M7061 Trochanteric bursitis, right hip: Secondary | ICD-10-CM | POA: Diagnosis not present

## 2022-02-19 DIAGNOSIS — M7701 Medial epicondylitis, right elbow: Secondary | ICD-10-CM

## 2022-02-19 DIAGNOSIS — Z8719 Personal history of other diseases of the digestive system: Secondary | ICD-10-CM

## 2022-02-19 DIAGNOSIS — M7062 Trochanteric bursitis, left hip: Secondary | ICD-10-CM

## 2022-02-19 DIAGNOSIS — M19071 Primary osteoarthritis, right ankle and foot: Secondary | ICD-10-CM | POA: Diagnosis not present

## 2022-02-19 DIAGNOSIS — Z8679 Personal history of other diseases of the circulatory system: Secondary | ICD-10-CM | POA: Diagnosis not present

## 2022-02-19 DIAGNOSIS — Z87891 Personal history of nicotine dependence: Secondary | ICD-10-CM | POA: Diagnosis not present

## 2022-02-19 DIAGNOSIS — R5383 Other fatigue: Secondary | ICD-10-CM

## 2022-02-19 DIAGNOSIS — M19042 Primary osteoarthritis, left hand: Secondary | ICD-10-CM

## 2022-02-19 DIAGNOSIS — M0579 Rheumatoid arthritis with rheumatoid factor of multiple sites without organ or systems involvement: Secondary | ICD-10-CM | POA: Diagnosis not present

## 2022-02-19 DIAGNOSIS — M19072 Primary osteoarthritis, left ankle and foot: Secondary | ICD-10-CM

## 2022-02-19 DIAGNOSIS — M19041 Primary osteoarthritis, right hand: Secondary | ICD-10-CM | POA: Diagnosis not present

## 2022-02-19 NOTE — Patient Instructions (Signed)
Standing Labs ?We placed an order today for your standing lab work.  ? ?Please have your standing labs drawn in July and every 3 months  ? ?If possible, please have your labs drawn 2 weeks prior to your appointment so that the provider can discuss your results at your appointment. ? ?Please note that you may see your imaging and lab results in MyChart before we have reviewed them. ?We may be awaiting multiple results to interpret others before contacting you. ?Please allow our office up to 72 hours to thoroughly review all of the results before contacting the office for clarification of your results. ? ?We have open lab daily: ?Monday through Thursday from 1:30-4:30 PM and Friday from 1:30-4:00 PM ?at the office of Dr. Shaili Deveshwar, [facility].   ?Please be advised, all patients with office appointments requiring lab work will take precedent over walk-in lab work.  ?If possible, please come for your lab work on Monday and Friday afternoons, as you may experience shorter wait times. ?The office is located at [address], [address], [place], [place] [address] ?No appointment is necessary.   ?Labs are drawn by [facility]. Please bring your co-pay at the time of your lab draw.  You may receive a bill from [facility] for your lab work. ? ?Please note if you are on Hydroxychloroquine and and an order has been placed for a Hydroxychloroquine level, you will need to have it drawn 4 hours or more after your last dose. ? ?If you wish to have your labs drawn at another location, please call the office 24 hours in advance to send orders. ? ?If you have any questions regarding directions or hours of operation,  ?please call [phone].   ?As a reminder, please drink plenty of water prior to coming for your lab work. Thanks! ? ?

## 2022-03-12 ENCOUNTER — Other Ambulatory Visit: Payer: Self-pay | Admitting: Rheumatology

## 2022-03-12 MED ORDER — ORENCIA CLICKJECT 125 MG/ML ~~LOC~~ SOAJ: SUBCUTANEOUS | 0 refills | Status: DC

## 2022-03-12 NOTE — Telephone Encounter (Signed)
Patient called the office requesting a refill of Orencia 125mg /ml to be sent to [facility] pharmacy.

## 2022-04-02 ENCOUNTER — Other Ambulatory Visit: Payer: Self-pay | Admitting: *Deleted

## 2022-04-02 DIAGNOSIS — Z79899 Other long term (current) drug therapy: Secondary | ICD-10-CM

## 2022-04-02 MED ORDER — ORENCIA CLICKJECT 125 MG/ML ~~LOC~~ SOAJ: SUBCUTANEOUS | 0 refills | Status: DC

## 2022-04-02 NOTE — Telephone Encounter (Signed)
Next Visit: 08/23/2022   Last Visit: 02/19/2022   Last Fill: 03/12/2022 (30 day supply)   DX: Rheumatoid arthritis involving multiple sites with positive rheumatoid factor    Current Dose per office note 05/19/5055: Orencia Clickject 979 mg subcutaneous injection every 7 days.    Labs: 01/02/2022 CBC WNL.  Glucose is 116. Rest of CMP WNL.    TB Gold: 01/02/2022   Patient advised she is due to update her labs. Patient states she will update them this week and orders released.    Okay to refill Orencia?

## 2022-04-05 DIAGNOSIS — Z79899 Other long term (current) drug therapy: Secondary | ICD-10-CM | POA: Diagnosis not present

## 2022-04-06 LAB — CBC WITH DIFFERENTIAL/PLATELET
Basophils Absolute: 0 10*3/uL (ref 0.0–0.2)
Basos: 1 %
EOS (ABSOLUTE): 0.1 10*3/uL (ref 0.0–0.4)
Eos: 2 %
Hematocrit: 36.3 % (ref 34.0–46.6)
Hemoglobin: 12.6 g/dL (ref 11.1–15.9)
Immature Grans (Abs): 0 10*3/uL (ref 0.0–0.1)
Immature Granulocytes: 0 %
Lymphocytes Absolute: 2.9 10*3/uL (ref 0.7–3.1)
Lymphs: 45 %
MCH: 31.7 pg (ref 26.6–33.0)
MCHC: 34.7 g/dL (ref 31.5–35.7)
MCV: 91 fL (ref 79–97)
Monocytes Absolute: 0.6 10*3/uL (ref 0.1–0.9)
Monocytes: 9 %
Neutrophils Absolute: 2.8 10*3/uL (ref 1.4–7.0)
Neutrophils: 43 %
RBC: 3.97 x10E6/uL (ref 3.77–5.28)
RDW: 12.7 % (ref 11.7–15.4)
WBC: 6.5 10*3/uL (ref 3.4–10.8)

## 2022-04-06 LAB — CMP14+EGFR
ALT: 27 IU/L (ref 0–32)
AST: 29 IU/L (ref 0–40)
Albumin/Globulin Ratio: 1.5 (ref 1.2–2.2)
Albumin: 3.9 g/dL (ref 3.9–4.9)
Alkaline Phosphatase: 78 IU/L (ref 44–121)
BUN/Creatinine Ratio: 14 (ref 12–28)
BUN: 11 mg/dL (ref 8–27)
Bilirubin Total: 0.3 mg/dL (ref 0.0–1.2)
CO2: 21 mmol/L (ref 20–29)
Calcium: 9.1 mg/dL (ref 8.7–10.3)
Chloride: 101 mmol/L (ref 96–106)
Creatinine, Ser: 0.8 mg/dL (ref 0.57–1.00)
Globulin, Total: 2.6 g/dL (ref 1.5–4.5)
Glucose: 96 mg/dL (ref 70–99)
Potassium: 4.6 mmol/L (ref 3.5–5.2)
Sodium: 137 mmol/L (ref 134–144)
Total Protein: 6.5 g/dL (ref 6.0–8.5)
eGFR: 83 mL/min/{1.73_m2} (ref >59)

## 2022-04-06 NOTE — Progress Notes (Signed)
CBC, CMP normal

## 2022-05-01 ENCOUNTER — Other Ambulatory Visit: Payer: Self-pay | Admitting: *Deleted

## 2022-05-01 MED ORDER — ORENCIA CLICKJECT 125 MG/ML ~~LOC~~ SOAJ: SUBCUTANEOUS | 0 refills | Status: DC

## 2022-05-01 NOTE — Telephone Encounter (Signed)
Refill request received via fax from [facility] for [place].  Next Visit: 08/23/2022  Last Visit: 02/19/2022  Last Fill: 04/02/2022 (30 day supply)  [id number] arthritis involving multiple sites with positive rheumatoid factor   Current Dose per office note 4/0/3754: Orencia Clickject 360 mg subcutaneous injection every 7 days.   Labs: 04/05/2022 CBC, CMP normal  TB Gold: 01/02/2022 Neg    Okay to refill Orencia?

## 2022-06-01 ENCOUNTER — Encounter: Payer: Self-pay | Admitting: *Deleted

## 2022-06-20 DIAGNOSIS — Z03818 Encounter for observation for suspected exposure to other biological agents ruled out: Secondary | ICD-10-CM | POA: Diagnosis not present

## 2022-06-20 DIAGNOSIS — Z20822 Contact with and (suspected) exposure to covid-19: Secondary | ICD-10-CM | POA: Diagnosis not present

## 2022-06-25 ENCOUNTER — Ambulatory Visit
Admission: RE | Admit: 2022-06-25 | Discharge: 2022-06-25 | Disposition: A | Discharge status: Home / Self Care | Payer: Medicare Other | Source: Ambulatory Visit | Attending: Urgent Care | Admitting: Urgent Care

## 2022-06-25 VITALS — BP 129/82 | HR 81 | Temp 98.2°F | Resp 18

## 2022-06-25 DIAGNOSIS — R059 Cough, unspecified: Secondary | ICD-10-CM | POA: Insufficient documentation

## 2022-06-25 DIAGNOSIS — J019 Acute sinusitis, unspecified: Secondary | ICD-10-CM | POA: Insufficient documentation

## 2022-06-25 DIAGNOSIS — Z1152 Encounter for screening for COVID-19: Secondary | ICD-10-CM | POA: Diagnosis not present

## 2022-06-25 DIAGNOSIS — J029 Acute pharyngitis, unspecified: Secondary | ICD-10-CM

## 2022-06-25 DIAGNOSIS — J209 Acute bronchitis, unspecified: Secondary | ICD-10-CM | POA: Insufficient documentation

## 2022-06-25 LAB — RESP PANEL BY RT-PCR (RSV, FLU A&B, COVID)  RVPGX2
Influenza A by PCR: NEGATIVE
Influenza B by PCR: NEGATIVE
Resp Syncytial Virus by PCR: NEGATIVE
SARS Coronavirus 2 by RT PCR: NEGATIVE

## 2022-06-25 LAB — POCT RAPID STREP A (OFFICE): Rapid Strep A Screen: NEGATIVE

## 2022-06-25 MED ORDER — METHYLPREDNISOLONE 4 MG PO TBPK: ORAL_TABLET | ORAL | 0 refills | Status: DC

## 2022-06-25 NOTE — ED Triage Notes (Signed)
Pt. Is c/o sore throat, cough, chills and nasal congestion for the past few days. Pt. Was tested twice for COVID, this past Thursday which was negative.

## 2022-06-25 NOTE — Discharge Instructions (Addendum)
Go to ED for worsening respiratory symptoms. Follow up here or with your primary care provider if your symptoms are worsening or not improving with treatment.

## 2022-06-25 NOTE — ED Provider Notes (Addendum)
[facility]    CSN: [id number] Arrival date & time: 06/25/22  1151      History   Chief Complaint Chief Complaint  Patient presents with   Cough    Chills, sore throat, cough, congestion - Entered by patient   Sore Throat   Chills   Generalized Body Aches   Nasal Congestion    HPI Samantha Yates is a 61 y.o. female.    Cough Sore Throat    Presents with complaint of flu-like symptoms now x5 days.  She says she tested positive for COVID at home and then presented in clinic for confirmation testing including PCR, which was negative.  She states her symptoms include acutely sore throat, cough - mostly unproductive, chills, nasal congestion, sinus pressure.  Past Medical History:  Diagnosis Date   Arthritis    Hypertension    Rheumatoid arthritis [facility])     Patient Active Problem List   Diagnosis Date Noted   Special screening for malignant neoplasms, colon    High risk medication use 11/08/2016   Primary osteoarthritis of both hands 11/08/2016   Primary osteoarthritis of both feet 11/08/2016   History of gastroesophageal reflux (GERD) 11/08/2016   History of hypertension 11/08/2016   History of anxiety 11/08/2016   History of allergic rhinitis 11/08/2016   Former smoker 11/08/2016   GERD (gastroesophageal reflux disease) 09/17/2008   Rheumatoid arthritis ([place]) 09/17/2006   Anxiety 09/17/2006   Hypertension 09/17/2005   Allergic rhinitis 09/17/1996    Past Surgical History:  Procedure Laterality Date   COLONOSCOPY N/A 04/09/2018   Procedure: COLONOSCOPY;  Surgeon: Danie Binder, MD;  [facility];  Service: Endoscopy;  Laterality: N/A;  9:30   POLYPECTOMY  04/09/2018   Procedure: POLYPECTOMY;  Surgeon: Danie Binder, MD;  [facility];  Service: Endoscopy;;  ascending, descending colon,rectal, transverse,   TONSILLECTOMY      OB History   No obstetric history on file.      Home Medications    Prior to Admission  medications   Medication Sig Start Date End Date Taking? Authorizing Provider  Abatacept (ORENCIA CLICKJECT) 009 MG/ML SOAJ Inject 125 mg subcutaneously every 7 days. 05/01/22   Ofilia Neas, PA-C  acetaminophen (TYLENOL) 650 MG CR tablet Take 650 mg by mouth every 8 (eight) hours as needed for pain.    [provider]  Ascorbic Acid (VITAMIN C PO) Take by mouth daily. IMMUNE BOOSTER Patient not taking: Reported on 02/19/2022    [provider]  B Complex Vitamins (B COMPLEX PO) Take by mouth daily.    [provider]  citalopram (CELEXA) 40 MG tablet Take 1 tablet (40 mg total) by mouth at bedtime. 06/20/12   Zenia Resides, MD  COVID-19 mRNA vaccine, Moderna, (MODERNA COVID-19 VACCINE) 100 MCG/0.5ML injection Inject into the muscle. Patient not taking: Reported on 09/20/2021 04/14/21   Carlyle Basques, MD  enalapril (VASOTEC) 10 MG tablet Take 10 mg by mouth daily.    [provider]  fluticasone (FLONASE) 50 MCG/ACT nasal spray Place 2 sprays into both nostrils as needed.     [provider]  folic acid (FOLVITE) 1 MG tablet Take 1 mg by mouth daily.    [provider]  montelukast (SINGULAIR) 10 MG tablet Take 10 mg by mouth as needed.     [provider]  nitrofurantoin, macrocrystal-monohydrate, (MACROBID) 100 MG capsule Take 1 capsule (100 mg total) by mouth 2 (two) times  daily. Patient not taking: Reported on 02/19/2022 02/01/22   Hans Eden, NP  omeprazole (PRILOSEC) 40 MG capsule Take 40 mg by mouth daily.    [provider]  predniSONE (DELTASONE) 5 MG tablet Take 4 tabs po x 4 days, 3  tabs po x 4 days, 2  tabs po x 4 days, 1  tab po x 4 days Patient not taking: Reported on 02/19/2022 12/29/21   Bo Merino, MD    Family History Family History  Problem Relation Age of Onset   Colon polyps Mother    Stroke Mother    Diabetes Brother    Diabetes Brother    Hypertension Daughter    Diabetes Daughter     Healthy Son    Colon cancer Cousin     Social History Social History   Tobacco Use   Smoking status: Former    Packs/day: 0.20    Years: 12.00    Total pack years: 2.40    Types: Cigarettes    Start date: 09/17/1986    Quit date: 09/17/1998    Years since quitting: 23.7   Smokeless tobacco: Never  Vaping Use   Vaping Use: Never used  Substance Use Topics   Alcohol use: Yes    Comment: rarely   Drug use: No     Allergies   Sulfamethoxazole   Review of Systems Review of Systems  Respiratory:  Positive for cough.      Physical Exam Triage Vital Signs ED Triage Vitals  Enc Vitals Group     BP 06/25/22 1208 129/82     Pulse Rate 06/25/22 1208 81     Resp 06/25/22 1208 18     Temp 06/25/22 1208 98.2 F (36.8 C)     Temp Source 06/25/22 1208 Oral     SpO2 06/25/22 1208 96 %     Weight --      Height --      Head Circumference --      Peak Flow --      Pain Score 06/25/22 1220 5     Pain Loc --      Pain Edu? --      Excl. in [place]? --    No data found.  Updated Vital Signs BP 129/82 (BP Location: Left Arm)   Pulse 81   Temp 98.2 F (36.8 C) (Oral)   Resp 18   SpO2 96%   Visual Acuity Right Eye Distance:   Left Eye Distance:   Bilateral Distance:    Right Eye Near:   Left Eye Near:    Bilateral Near:     Physical Exam Vitals reviewed.  HENT:     Mouth/Throat:     Mouth: Mucous membranes are moist.     Pharynx: Posterior oropharyngeal erythema present. No oropharyngeal exudate.     Tonsils: 0 on the right. 0 on the left.  Eyes:     Conjunctiva/sclera: Conjunctivae normal.  Cardiovascular:     Rate and Rhythm: Normal rate and regular rhythm.     Heart sounds: Normal heart sounds.  Pulmonary:     Effort: Pulmonary effort is normal.     Breath sounds: Transmitted upper airway sounds present.     Comments: Coarse breath sounds, likely bronchitis. Skin:    General: Skin is warm and dry.  Neurological:     General: No focal deficit present.      Mental Status: She is alert and oriented to person, place, and time.  Psychiatric:        Mood and Affect: Mood normal.        Behavior: Behavior normal.      UC Treatments / Results  Labs (all labs ordered are listed, but only abnormal results are displayed) Labs Reviewed  RESP PANEL BY RT-PCR (RSV, FLU A&B, COVID)  [id number]  POCT RAPID STREP A (OFFICE)    EKG   Radiology No results found.  Procedures Procedures (including critical care time)  Medications Ordered in UC Medications - No data to display  Initial Impression / Assessment and Plan / UC Course  I have reviewed the triage vital signs and the nursing notes.  Pertinent labs & imaging results that were available during my care of the patient were reviewed by me and considered in my medical decision making (see chart for details).   Rapid strep is negative.  Presume viral etiology for her symptoms, likely COVID given her home positive test.  Suggested to patient she should consider COVID as the cause of her symptoms regardless of the negative in clinic results.  She endorses some shortness of breath along with frequent cough.  Cough is present in clinic.  Offered to treat cough/bronchitis with course of steroids.  Discussed need to present to ED with worsening symptoms.   Final Clinical Impressions(s) / UC Diagnoses   Final diagnoses:  Acute rhinosinusitis   Discharge Instructions   None    ED Prescriptions   None    PDMP not reviewed this encounter.   Rose Phi, [place] 06/25/22 1244    [place] Center[place]nie Main, [place] 06/25/22 1252

## 2022-07-06 ENCOUNTER — Telehealth: Payer: Self-pay | Admitting: Rheumatology

## 2022-07-06 NOTE — Telephone Encounter (Signed)
I called patient, per Hazel Sams, PA-C, patient does not need to stop Orencia before taking the flue shot.

## 2022-07-06 NOTE — Telephone Encounter (Signed)
Patient called the office requesting a call back on if she needs to stop her Samantha Yates if she gets her flu shot.

## 2022-07-20 ENCOUNTER — Other Ambulatory Visit: Payer: Self-pay | Admitting: *Deleted

## 2022-07-20 DIAGNOSIS — Z79899 Other long term (current) drug therapy: Secondary | ICD-10-CM

## 2022-07-20 MED ORDER — ORENCIA CLICKJECT 125 MG/ML ~~LOC~~ SOAJ: SUBCUTANEOUS | 0 refills | Status: DC

## 2022-07-20 NOTE — Telephone Encounter (Signed)
Refill request received via fax from [facility] for [place].   Next Visit: 08/23/2022   Last Visit: 02/19/2022   Last Fill: 05/01/2022   CQ:PEAKLTYVDP arthritis involving multiple sites with positive rheumatoid factor    Current Dose per office note 11/16/2565: Orencia Clickject 209 mg subcutaneous injection every 7 days.    Labs: 04/05/2022 CBC, CMP normal   TB Gold: 01/02/2022 Neg     Patient plans on going next week to update labs. Orders released.   Okay to refill Orencia?

## 2022-07-26 ENCOUNTER — Telehealth: Payer: Self-pay | Admitting: Pharmacist

## 2022-07-26 NOTE — Telephone Encounter (Signed)
Received fax from [facility] requesting completion of PAP 1856 renewal application for Orencia. Patient requested her portion be mailed to home. Reminded her of income document requirement. She will complete and return to clinic with income documents   Provider portion placed in Hazel Sams, PA-C's, folder for signature.   Case # [id number]   Knox Saliva, PharmD, MPH, BCPS, CPP Clinical Pharmacist (Rheumatology and Pulmonology)

## 2022-07-30 NOTE — Telephone Encounter (Signed)
Provider portion received from Hazel Sams, PA-C. Submission is awaiting patient portion  Knox Saliva, PharmD, MPH, BCPS, CPP Clinical Pharmacist (Rheumatology and Pulmonology)

## 2022-08-13 NOTE — Progress Notes (Signed)
Office Visit Note  Patient: Samantha Yates             Date of Birth: 09-18-1960           MRN: [id number]             PCP: Sharilyn Sites, MD Referring: Sharilyn Sites, MD Visit Date: 08/23/2022 Occupation: '@GUAROCC'$ @  Subjective:  Follow-up (Left 4th toe pain, left knee pain)   History of Present Illness: Samantha Yates is a 61 y.o. female with history of seropositive rheumatoid arthritis and osteoarthritis.  She states she has been taking Orencia subcu injections on a weekly basis without any interruption.  She states for the last month she has been having discomfort in her left knee joint and her left fourth toe.  She has no history of injury.  No she has not noticed any joint swelling.  Still has intermittent discomfort bilateral trochanteric bursa especially when she sleeps on her side.  She also has intermittent right medial epicondylitis.  Activities of Daily Living:  Patient reports morning stiffness for 30 minutes.   Patient Reports nocturnal pain.  Difficulty dressing/grooming: Denies Difficulty climbing stairs: Reports Difficulty getting out of chair: Denies Difficulty using hands for taps, buttons, cutlery, and/or writing: Reports  Review of Systems  Constitutional:  Positive for fatigue.  HENT:  Positive for mouth dryness. Negative for mouth sores.   Eyes:  Positive for dryness.  Respiratory:  Negative for shortness of breath.   Cardiovascular:  Negative for chest pain and palpitations.  Gastrointestinal:  Negative for blood in stool, constipation and diarrhea.  Endocrine: Negative for increased urination.  Genitourinary:  Negative for involuntary urination.  Musculoskeletal:  Positive for joint pain, joint pain, joint swelling and morning stiffness. Negative for gait problem, myalgias, muscle weakness, muscle tenderness and myalgias.  Skin:  Negative for color change, rash, hair loss and sensitivity to sunlight.  Allergic/Immunologic: Positive for susceptible to  infections.  Neurological:  Positive for headaches. Negative for dizziness.  Hematological:  Negative for swollen glands.  Psychiatric/Behavioral:  Negative for depressed mood and sleep disturbance. The patient is not nervous/anxious.     PMFS History:  Patient Active Problem List   Diagnosis Date Noted   Special screening for malignant neoplasms, colon    High risk medication use 11/08/2016   Primary osteoarthritis of both hands 11/08/2016   Primary osteoarthritis of both feet 11/08/2016   History of gastroesophageal reflux (GERD) 11/08/2016   History of hypertension 11/08/2016   History of anxiety 11/08/2016   History of allergic rhinitis 11/08/2016   Former smoker 11/08/2016   GERD (gastroesophageal reflux disease) 09/17/2008   Rheumatoid arthritis ([place]) 09/17/2006   Anxiety 09/17/2006   Hypertension 09/17/2005   Allergic rhinitis 09/17/1996    Past Medical History:  Diagnosis Date   Arthritis    Hypertension    Rheumatoid arthritis ([place])     Family History  Problem Relation Age of Onset   Colon polyps Mother    Stroke Mother    Diabetes Brother    Diabetes Brother    Hypertension Daughter    Diabetes Daughter    Healthy Son    Colon cancer Cousin    Past Surgical History:  Procedure Laterality Date   COLONOSCOPY N/A 04/09/2018   Procedure: COLONOSCOPY;  Surgeon: Danie Binder, MD;  [facility];  Service: Endoscopy;  Laterality: N/A;  9:30   POLYPECTOMY  04/09/2018   Procedure: POLYPECTOMY;  Surgeon: Danie Binder, MD;  [facility];  Service: Endoscopy;;  ascending, descending colon,rectal, transverse,   TONSILLECTOMY     Social History   Social History Narrative   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   Immunization History  Administered Date(s) Administered   Moderna SARS-COV2 Booster Vaccination 04/14/2021   Moderna Sars-Covid-2 Vaccination 12/04/2019, 01/05/2020, 05/12/2020   Tdap 04/03/2015     Objective: Vital  Signs: BP 125/76 (BP Location: Left Arm, Patient Position: Sitting, Cuff Size: Normal)   Pulse 82   Resp 16   Ht '5\' 8"'$  (1.727 m)   Wt 188 lb (85.3 kg)   BMI 28.59 kg/m    Physical Exam Vitals and nursing note reviewed.  Constitutional:      Appearance: She is well-developed.  HENT:     Head: Normocephalic and atraumatic.  Eyes:     Conjunctiva/sclera: Conjunctivae normal.  Cardiovascular:     Rate and Rhythm: Normal rate and regular rhythm.     Heart sounds: Normal heart sounds.  Pulmonary:     Effort: Pulmonary effort is normal.     Breath sounds: Normal breath sounds.  Abdominal:     General: Bowel sounds are normal.     Palpations: Abdomen is soft.  Musculoskeletal:     Cervical back: Normal range of motion.  Lymphadenopathy:     Cervical: No cervical adenopathy.  Skin:    General: Skin is warm and dry.     Capillary Refill: Capillary refill takes less than 2 seconds.  Neurological:     Mental Status: She is alert and oriented to person, place, and time.  Psychiatric:        Behavior: Behavior normal.      Musculoskeletal Exam: Cervical spine was in good range of motion.  Shoulder joints, elbow joints, wrist joints, MCPs PIPs and DIPs with good range of motion with no synovitis.  Hip joints and knee joints with good range of motion.  No warmth swelling or effusion was noted.  She had some tenderness over left fourth toe DIP joint without any synovitis.  No tenderness over MTPs or ankle joints was noted.  CDAI Exam: CDAI Score: -- Patient Global: 1 mm; Provider Global: 1 mm Swollen: --; Tender: -- Joint Exam 08/23/2022   No joint exam has been documented for this visit   There is currently no information documented on the homunculus. Go to the Rheumatology activity and complete the homunculus joint exam.  Investigation: No additional findings.  Imaging: No results found.  Recent Labs: Lab Results  Component Value Date   WBC 8.5 08/14/2022   HGB 13.0  08/14/2022   PLT 128 (L) 08/14/2022   NA 140 08/14/2022   K 4.6 08/14/2022   CL 103 08/14/2022   CO2 25 08/14/2022   GLUCOSE 108 (H) 08/14/2022   BUN 13 08/14/2022   CREATININE 0.91 08/14/2022   BILITOT 0.2 08/14/2022   ALKPHOS 72 08/14/2022   AST 17 08/14/2022   ALT 12 08/14/2022   PROT 6.6 08/14/2022   ALBUMIN 4.4 08/14/2022   CALCIUM 9.4 08/14/2022   GFRAA 81 07/28/2020   QFTBGOLDPLUS Negative 01/02/2022    Speciality Comments: No specialty comments available.  Procedures:  No procedures performed Allergies: Sulfamethoxazole   Assessment / Plan:     Visit Diagnoses: Rheumatoid arthritis involving multiple sites with positive rheumatoid factor (HCC) - +RF,+anti-CCP with nodulosis: Patient states that she has been taking Orencia injections on a weekly basis except for when she had upper respiratory tract infection she skipped a  dose.  She denies having a flare of rheumatoid arthritis.  She states for the last 1 months she has been having intermittent discomfort in her left knee joint and her left fourth toe.  She has not noticed any joint swelling.  No synovitis was noted on the examination today.  High risk medication use - Orencia Clickject 559 mg subcutaneous injection every 7 days.  Labs obtained on August 14, 2022 CBC was normal except platelets 128, CMP was normal.  TB Gold was negative on January 02, 2022.  Left findings were discussed with the patient.  She was advised to get labs again in February and every 3 months.  Information for immunization was placed in the AVS.  She was advised to hold Orencia if she develops an infection and resume after the infection resolves.  Thrombocytopenia-platelets were low at 128 on August 14, 2022.  Advised her to repeat CBC and get peripheral blood smear towards the end of the month.  Will contact her once lab results are available.  If she has persistent thrombocytopenia we will refer to hematology for evaluation.  Primary  osteoarthritis of both hands-she complains of discomfort in her bilateral hands but no synovitis was noted.  She has bilateral PIP and DIP thickening.  Acute pain of left knee-she has been experiencing pain and discomfort in the left knee joint off and on for the last 1 month.  No warmth swelling or effusion was noted.  A handout on lower extremity exercises was given.  Use of topical Voltaren gel was also discussed.  Primary osteoarthritis of both feet-she complains of discomfort over left fourth DIP joint.  No warmth swelling or effusion was noted.  Use of sleeve gel tubing and thicker socks were advised.  If she has ongoing discomfort she should notify us.  Trochanteric bursitis of both hips -she has intermittent symptoms.  IT band stretches were advised.  Medial epicondylitis of right elbow -she gives history of intermittent symptoms of right medial epicondylitis.  She did not have any tenderness on palpation today.  Other fatigue-she continues to have fatigue.  History of anxiety-she is on Celexa.  History of hypertension-blood pressure was normal today.  She is on enalapril.  History of gastroesophageal reflux (GERD)-she takes omeprazole.  History of allergic rhinitis  Former smoker  Orders: No orders of the defined types were placed in this encounter.  No orders of the defined types were placed in this encounter.    Follow-Up Instructions: Return in about 5 months (around 01/22/2023) for Rheumatoid arthritis, Osteoarthritis.   Bo Merino, MD  Note - This record has been created using Editor, commissioning.  Chart creation errors have been sought, but may not always  have been located. Such creation errors do not reflect on  the standard of medical care.

## 2022-08-14 ENCOUNTER — Other Ambulatory Visit: Payer: Self-pay | Admitting: Pharmacist

## 2022-08-14 DIAGNOSIS — Z79899 Other long term (current) drug therapy: Secondary | ICD-10-CM | POA: Diagnosis not present

## 2022-08-14 NOTE — Telephone Encounter (Signed)
Patient portion returned to sender. Called patient to confirm address and the address we have on file is correct [facility] [place], [place] [address]). Patient will be in [place] on Thursday this week and will stop by clinic to fill out PAP patient portion.   Patient was informed that we will need income documents for her household.   Patient also indicated that she will get her labs done at a [facility] today; labs were released via [facility].   Forms are at front desk in file cabinet.   [place] [facility] PharmD Candidate [phone]

## 2022-08-14 NOTE — Progress Notes (Signed)
[facility] orders for CBC and CMP released.  Knox Saliva, PharmD, MPH, BCPS, CPP Clinical Pharmacist (Rheumatology and Pulmonology)

## 2022-08-15 ENCOUNTER — Other Ambulatory Visit: Payer: Self-pay | Admitting: *Deleted

## 2022-08-15 DIAGNOSIS — M0579 Rheumatoid arthritis with rheumatoid factor of multiple sites without organ or systems involvement: Secondary | ICD-10-CM

## 2022-08-15 DIAGNOSIS — Z79899 Other long term (current) drug therapy: Secondary | ICD-10-CM

## 2022-08-15 LAB — CMP14+EGFR
ALT: 12 IU/L (ref 0–32)
AST: 17 IU/L (ref 0–40)
Albumin/Globulin Ratio: 2 (ref 1.2–2.2)
Albumin: 4.4 g/dL (ref 3.9–4.9)
Alkaline Phosphatase: 72 IU/L (ref 44–121)
BUN/Creatinine Ratio: 14 (ref 12–28)
BUN: 13 mg/dL (ref 8–27)
Bilirubin Total: 0.2 mg/dL (ref 0.0–1.2)
CO2: 25 mmol/L (ref 20–29)
Calcium: 9.4 mg/dL (ref 8.7–10.3)
Chloride: 103 mmol/L (ref 96–106)
Creatinine, Ser: 0.91 mg/dL (ref 0.57–1.00)
Globulin, Total: 2.2 g/dL (ref 1.5–4.5)
Glucose: 108 mg/dL — ABNORMAL HIGH (ref 70–99)
Potassium: 4.6 mmol/L (ref 3.5–5.2)
Sodium: 140 mmol/L (ref 134–144)
Total Protein: 6.6 g/dL (ref 6.0–8.5)
eGFR: 71 mL/min/1.73

## 2022-08-15 LAB — CBC WITH DIFFERENTIAL/PLATELET
Basophils Absolute: 0 10*3/uL (ref 0.0–0.2)
Basos: 1 %
EOS (ABSOLUTE): 0.1 10*3/uL (ref 0.0–0.4)
Eos: 2 %
Hematocrit: 37.8 % (ref 34.0–46.6)
Hemoglobin: 13 g/dL (ref 11.1–15.9)
Immature Grans (Abs): 0 10*3/uL (ref 0.0–0.1)
Immature Granulocytes: 0 %
Lymphocytes Absolute: 3.3 10*3/uL — ABNORMAL HIGH (ref 0.7–3.1)
Lymphs: 39 %
MCH: 31.6 pg (ref 26.6–33.0)
MCHC: 34.4 g/dL (ref 31.5–35.7)
MCV: 92 fL (ref 79–97)
Monocytes Absolute: 0.6 10*3/uL (ref 0.1–0.9)
Monocytes: 7 %
Neutrophils Absolute: 4.4 10*3/uL (ref 1.4–7.0)
Neutrophils: 51 %
Platelets: 128 10*3/uL — ABNORMAL LOW (ref 150–450)
RBC: 4.11 x10E6/uL (ref 3.77–5.28)
RDW: 12.5 % (ref 11.7–15.4)
WBC: 8.5 10*3/uL (ref 3.4–10.8)

## 2022-08-15 NOTE — Progress Notes (Signed)
Glucose is 108. Rest of CMP WNL.  Plt count is slightly low.  Absolute lymphocyte count is borderline elevated.  Recommend repeating CBC with diff in 1 month.

## 2022-08-17 ENCOUNTER — Other Ambulatory Visit: Payer: Self-pay | Admitting: *Deleted

## 2022-08-17 MED ORDER — ORENCIA CLICKJECT 125 MG/ML ~~LOC~~ SOAJ: 125.0000 mg | SUBCUTANEOUS | 0 refills | Status: DC

## 2022-08-17 NOTE — Telephone Encounter (Signed)
Refill request received via fax from [facility] for Samantha Yates  Next Visit: 08/23/2022  Last Visit: 02/19/2022  Last Fill: 07/20/2022 (30 day supply)  BN:[id number] arthritis involving multiple sites with positive rheumatoid factor   Current Dose per office note 02/22/9569: Orencia Clickject 220 mg subcutaneous injection every 7 days.    Labs: 08/14/2022 Glucose is 108. Rest of CMP WNL. Plt count is slightly low.  Absolute lymphocyte count is borderline elevated.  TB Gold: 01/02/2022 Neg    Okay to refill Orencia?

## 2022-08-20 NOTE — Telephone Encounter (Addendum)
Received signed patient portion with income documents.  Application Case ID # [id number]  PAP submission is pending PA approval letter. Submitted a Prior Authorization RENEWAL request to [facility] for [facility] via CoverMyMeds. Will update once we receive a response.  Key: Emmaline Kluver, PharmD, MPH, BCPS, CPP Clinical Pharmacist (Rheumatology and Pulmonology)

## 2022-08-20 NOTE — Telephone Encounter (Signed)
Received notification from [facility] regarding a prior authorization for [facility]. Authorization has been APPROVED from 08/20/22 to 02/19/2023.   Authorization # [id number] Phone # [phone]  Submitted Patient Assistance RENEWAL Application to [place] for [facility] along with provider portion, patient portion, med list, insurance card copy, PA and income documents. Will update patient when we receive a response.  Fax# [phone] Phone# [phone]

## 2022-08-23 ENCOUNTER — Encounter: Payer: Self-pay | Admitting: Rheumatology

## 2022-08-23 ENCOUNTER — Ambulatory Visit: Payer: Medicare Other | Attending: Rheumatology | Admitting: Rheumatology

## 2022-08-23 VITALS — BP 125/76 | HR 82 | Resp 16 | Ht 68.0 in | Wt 188.0 lb

## 2022-08-23 DIAGNOSIS — Z79899 Other long term (current) drug therapy: Secondary | ICD-10-CM | POA: Diagnosis not present

## 2022-08-23 DIAGNOSIS — M0579 Rheumatoid arthritis with rheumatoid factor of multiple sites without organ or systems involvement: Secondary | ICD-10-CM

## 2022-08-23 DIAGNOSIS — Z8709 Personal history of other diseases of the respiratory system: Secondary | ICD-10-CM

## 2022-08-23 DIAGNOSIS — M19071 Primary osteoarthritis, right ankle and foot: Secondary | ICD-10-CM | POA: Diagnosis not present

## 2022-08-23 DIAGNOSIS — Z8679 Personal history of other diseases of the circulatory system: Secondary | ICD-10-CM | POA: Diagnosis not present

## 2022-08-23 DIAGNOSIS — M7062 Trochanteric bursitis, left hip: Secondary | ICD-10-CM

## 2022-08-23 DIAGNOSIS — M7701 Medial epicondylitis, right elbow: Secondary | ICD-10-CM | POA: Diagnosis not present

## 2022-08-23 DIAGNOSIS — M25562 Pain in left knee: Secondary | ICD-10-CM | POA: Diagnosis not present

## 2022-08-23 DIAGNOSIS — R5383 Other fatigue: Secondary | ICD-10-CM | POA: Diagnosis not present

## 2022-08-23 DIAGNOSIS — M19041 Primary osteoarthritis, right hand: Secondary | ICD-10-CM | POA: Diagnosis not present

## 2022-08-23 DIAGNOSIS — D696 Thrombocytopenia, unspecified: Secondary | ICD-10-CM

## 2022-08-23 DIAGNOSIS — Z8719 Personal history of other diseases of the digestive system: Secondary | ICD-10-CM | POA: Diagnosis not present

## 2022-08-23 DIAGNOSIS — Z8659 Personal history of other mental and behavioral disorders: Secondary | ICD-10-CM

## 2022-08-23 DIAGNOSIS — Z87891 Personal history of nicotine dependence: Secondary | ICD-10-CM

## 2022-08-23 DIAGNOSIS — M7061 Trochanteric bursitis, right hip: Secondary | ICD-10-CM | POA: Diagnosis not present

## 2022-08-23 DIAGNOSIS — M19072 Primary osteoarthritis, left ankle and foot: Secondary | ICD-10-CM

## 2022-08-23 DIAGNOSIS — M19042 Primary osteoarthritis, left hand: Secondary | ICD-10-CM

## 2022-08-23 NOTE — Patient Instructions (Signed)
Standing Labs We placed an order today for your standing lab work.   Please have your standing labs drawn in February and every 3 months  Please have your labs drawn 2 weeks prior to your appointment so that the provider can discuss your lab results at your appointment.  Please note that you may see your imaging and lab results in [facility] before we have reviewed them. We will contact you once all results are reviewed. Please allow our office up to 72 hours to thoroughly review all of the results before contacting the office for clarification of your results.  Lab hours are:   Monday through Thursday from 8:00 am -12:30 pm and 1:00 pm-5:00 pm and Friday from 8:00 am-12:00 pm.  Please be advised, all patients with office appointments requiring lab work will take precedent over walk-in lab work.   Labs are drawn by [facility]. Please bring your co-pay at the time of your lab draw.  You may receive a bill from [facility] for your lab work.  Please note if you are on Hydroxychloroquine and and an order has been placed for a Hydroxychloroquine level, you will need to have it drawn 4 hours or more after your last dose.  If you wish to have your labs drawn at another location, please call the office 24 hours in advance so we can fax the orders.  The office is located at [address], [place], [place], [place] [address] No appointment is necessary.    If you have any questions regarding directions or hours of operation,  please call [phone].   As a reminder, please drink plenty of water prior to coming for your lab work. Thanks!   Vaccines You are taking a medication(s) that can suppress your immune system.  The following immunizations are recommended: Flu annually Covid-19  Td/Tdap (tetanus, diphtheria, pertussis) every 10 years Pneumonia (Prevnar 15 then Pneumovax 23 at least 1 year apart.  Alternatively, can take Prevnar 20 without needing additional dose) Shingrix: 2 doses from 4 weeks  to 6 months apart  Please check with your PCP to make sure you are up to date.   If you have signs or symptoms of an infection or start antibiotics: First, call your PCP for workup of your infection. Hold your medication through the infection, until you complete your antibiotics, and until symptoms resolve if you take the following: Injectable medication (Actemra, Benlysta, Cimzia, Cosentyx, Enbrel, Humira, Kevzara, Orencia, Remicade, Simponi, Stelara, Taltz, Tremfya) Methotrexate Leflunomide (Arava) Mycophenolate (Cellcept) Roma Kayser, or Rinvoq   Exercises for Chronic Knee Pain Chronic knee pain is pain that lasts longer than 3 months. For most people with chronic knee pain, exercise and weight loss is an important part of treatment. Your health care provider may want you to focus on: Strengthening the muscles that support your knee. This can take pressure off your knee and lessen pain. Preventing knee stiffness. Maintaining or increasing how far you can move your knee. Losing weight (if this applies) to take pressure off your knee, decrease your risk for injury, and make it easier for you to exercise. Your health care provider will help you develop an exercise program that matches your needs and physical abilities. Below are simple, low-impact exercises you can do at home. Ask your health care provider or a physical therapist how often you should do your exercise program and how many times to repeat each exercise. General safety tips Follow these safety tips for exercising with chronic knee pain: Get your  health care provider's approval before doing any exercises. Start slowly and stop any time an exercise causes pain. Do not exercise if your knee pain is flaring up. Warm up first. Stretching a cold muscle can cause an injury. Do 5-10 minutes of easy movement or light stretching before beginning your exercise routine. Do 5-10 minutes of low-impact activity (like walking or  cycling) before starting strengthening exercises. Contact your health care provider any time you have pain during or after exercising. Exercise may cause discomfort but should not be painful. It is normal to be a little stiff or sore after exercising.  Stretching and range-of-motion exercises Front thigh stretch  Stand up straight and support your body by holding on to a chair or resting one hand on a wall. With your legs straight and close together, bend one knee to lift your heel up toward your buttocks. Using one hand for support, grab your ankle with your free hand. Pull your foot up closer toward your buttocks to feel the stretch in front of your thigh. Hold the stretch for 30 seconds. Repeat __________ times. Complete this exercise __________ times a day. Back thigh stretch  Sit on the floor with your back straight and your legs out straight in front of you. Place the palms of your hands on the floor and slide them toward your feet as you bend at the hip. Try to touch your nose to your knees and feel the stretch in the back of your thighs. Hold for 30 seconds. Repeat __________ times. Complete this exercise __________ times a day. Calf stretch  Stand facing a wall. Place the palms of your hands flat against the wall, arms extended, and lean slightly against the wall. Get into a lunge position with one leg bent at the knee and the other leg stretched out straight behind you. Keep both feet facing the wall and increase the bend in your knee while keeping the heel of the other leg flat on the ground. You should feel the stretch in your calf. Hold for 30 seconds. Repeat __________ times. Complete this exercise __________ times a day. Strengthening exercises Straight leg lift Lie on your back with one knee bent and the other leg out straight. Slowly lift the straight leg without bending the knee. Lift until your foot is about 12 inches (30 cm) off the floor. Hold for 3-5 seconds and  slowly lower your leg. Repeat __________ times. Complete this exercise __________ times a day. Single leg dip Stand between two chairs and put both hands on the backs of the chairs for support. Extend one leg out straight with your body weight resting on the heel of the standing leg. Slowly bend your standing knee to dip your body to the level that is comfortable for you. Hold for 3-5 seconds. Repeat __________ times. Complete this exercise __________ times a day. Hamstring curls Stand straight, knees close together, facing the back of a chair. Hold on to the back of a chair with both hands. Keep one leg straight. Bend the other knee while bringing the heel up toward the buttock until the knee is bent at a 90-degree angle (right angle). Hold for 3-5 seconds. Repeat __________ times. Complete this exercise __________ times a day. Wall squat Stand straight with your back, hips, and head against a wall. Step forward one foot at a time with your back still against the wall. Your feet should be 2 feet (61 cm) from the wall at shoulder width. Keeping your back, hips,  and head against the wall, slide down the wall to as close of a sitting position as you can get. Hold for 5-10 seconds, then slowly slide back up. Repeat __________ times. Complete this exercise __________ times a day. Step-ups Step up with one foot onto a sturdy platform or stool that is about 6 inches (15 cm) high. Face sideways with one foot on the platform and one on the ground. Place all your weight on the platform foot and lift your body off the ground until your knee extends. Let your other leg hang free to the side. Hold for 3-5 seconds then slowly lower your weight down to the floor foot. Repeat __________ times. Complete this exercise __________ times a day. Contact a health care provider if: Your exercise causes pain. Your pain is worse after you exercise. Your pain prevents you from doing your exercises. This  information is not intended to replace advice given to you by your health care provider. Make sure you discuss any questions you have with your health care provider. Document Revised: 01/07/2020 Document Reviewed: 08/31/2019 Elsevier Patient Education  [place].

## 2022-08-23 NOTE — Addendum Note (Signed)
Addended by: Francis Gaines C on: 08/23/2022 11:48 AM   Modules accepted: Orders

## 2022-09-14 ENCOUNTER — Other Ambulatory Visit: Payer: Self-pay | Admitting: *Deleted

## 2022-09-14 DIAGNOSIS — M0689 Other specified rheumatoid arthritis, multiple sites: Secondary | ICD-10-CM | POA: Diagnosis not present

## 2022-09-14 DIAGNOSIS — I10 Essential (primary) hypertension: Secondary | ICD-10-CM | POA: Diagnosis not present

## 2022-09-14 DIAGNOSIS — E782 Mixed hyperlipidemia: Secondary | ICD-10-CM | POA: Diagnosis not present

## 2022-09-14 DIAGNOSIS — Z79899 Other long term (current) drug therapy: Secondary | ICD-10-CM

## 2022-09-14 DIAGNOSIS — Z0001 Encounter for general adult medical examination with abnormal findings: Secondary | ICD-10-CM | POA: Diagnosis not present

## 2022-09-14 DIAGNOSIS — M0579 Rheumatoid arthritis with rheumatoid factor of multiple sites without organ or systems involvement: Secondary | ICD-10-CM

## 2022-09-14 NOTE — Addendum Note (Signed)
Addended by: Carole Binning on: 09/14/2022 11:10 AM   Modules accepted: Orders

## 2022-09-21 ENCOUNTER — Telehealth: Payer: Self-pay | Admitting: *Deleted

## 2022-09-21 NOTE — Telephone Encounter (Signed)
Labs received from: [facility]  Drawn on:09/14/2022  Reviewed by:Hazel Sams, PA  Labs drawn:CMP  Results: Cholesterol, Total 220 Triglycerides        240 VLDL Chol. Cal      43 LDL Chol Calc      132 Platelets               134 Lymphs Absolute  4.0

## 2022-10-16 ENCOUNTER — Telehealth: Payer: Self-pay

## 2022-10-16 NOTE — Telephone Encounter (Signed)
Patient called to check status of orencia patient assistance application renewal. Please advise. Thanks!

## 2022-10-17 NOTE — Telephone Encounter (Signed)
Spoke with patient assistance rep for Orencia. They are halfway through her application. I was given an estimate of end of the week for determination.  Called the patient to provide and update at this time. Encouraged her to call the PAP company if we have not heard back by the end of the week.  Maryan Puls, PharmD PGY-1 [facility] Resident

## 2022-10-17 NOTE — Telephone Encounter (Signed)
Application review in process. Reached out to patient to provide an update.

## 2022-10-24 NOTE — Telephone Encounter (Signed)
Called [employer] for update on patient's application for Orencia. Rep states that since last week, patient's application was not moved forward. Rep said she is moving it forward today under expedited review.  Phone: [phone]  Knox Saliva, PharmD, MPH, BCPS, CPP Clinical Pharmacist (Rheumatology and Pulmonology)

## 2022-10-25 NOTE — Telephone Encounter (Signed)
Received a fax from  [facility] regarding an approval for [facility] patient assistance from 10/25/2022 to 09/17/2023. Approval letter sent to scan center.  Phone number: [phone]  ATC patient to review. Left VM advising her to reach out to [employer] directly if due for a refill  Knox Saliva, PharmD, MPH, BCPS, CPP Clinical Pharmacist (Rheumatology and Pulmonology)

## 2022-11-19 ENCOUNTER — Telehealth: Payer: Self-pay

## 2022-11-19 ENCOUNTER — Other Ambulatory Visit: Payer: Self-pay | Admitting: *Deleted

## 2022-11-19 MED ORDER — PREDNISONE 5 MG PO TABS: ORAL_TABLET | ORAL | 0 refills | Status: DC

## 2022-11-19 NOTE — Telephone Encounter (Signed)
Patient contacted the office stating she is in pain and would like a prescription of Prednisone. Patient states she was moving from her second floor apartment and is now having pain in both knees. Patient states she believes she is having an arthritis flare up. Patient states she is having pain and swelling in both knees. Patient states that her knees hurt to the touch and that she cannot straighten out her knees. Patient states that she cannot sleep and has not missed any of her medications. Patient states she would like Prednisone sent to [facility] on [address] in [place]. Patient's call back number is [phone].

## 2022-11-19 NOTE — Telephone Encounter (Signed)
Please send prescription for prednisone starting at 20 mg and taper by 5 mg every 4 days.  Please advise patient that prednisone can cause hypertension, elevated blood glucose, heart disease, osteoporosis and cataracts.

## 2022-11-19 NOTE — Telephone Encounter (Signed)
I called patient, patient verbalized understanding,

## 2022-11-21 ENCOUNTER — Other Ambulatory Visit: Payer: Self-pay | Admitting: *Deleted

## 2022-11-21 MED ORDER — ORENCIA CLICKJECT 125 MG/ML ~~LOC~~ SOAJ: 125.0000 mg | SUBCUTANEOUS | 0 refills | Status: DC

## 2022-11-21 NOTE — Telephone Encounter (Signed)
Next Visit: 01/24/2023  Last Visit: 08/23/2022  Last Fill: 08/17/2022  DX: Rheumatoid arthritis involving multiple sites with positive rheumatoid factor   Current Dose per office note [id number]: Orencia Clickject 0000000 mg subcutaneous injection every 7 days   Labs: [facility]   Drawn on:09/14/2022   Reviewed by:Hazel Sams, PA   Labs drawn:CMP   Results: Cholesterol, Total 220 Triglycerides        240 VLDL Chol. Cal      43 LDL Chol Calc      132 Platelets               134 Lymphs Absolute  4.0  08/14/2022 Glucose is 108. Rest of CMP WNL. Plt count is slightly low.  Absolute lymphocyte count is borderline elevated.    TB Gold: 01/02/2022 TB gold negative.   Okay to refill Orencia?

## 2022-11-22 ENCOUNTER — Other Ambulatory Visit: Payer: Self-pay

## 2022-11-22 NOTE — Telephone Encounter (Signed)
Error

## 2022-12-01 IMAGING — CT CT TEMPORAL BONES W/O CM
3 of 7 series · 15 of 40 positions shown, 18 images · non-contrast
Comparison: No pertinent prior exams available for comparison.

CLINICAL DATA: Contusion of other part of head, initial encounter.
Additional history provided by scanning technologist: Patient
reports jumping and hitting mid forehead on window cell, small
laceration to mid forehead and bruising around both eyes.

EXAM:
CT TEMPORAL BONES WITHOUT CONTRAST
TECHNIQUE: Axial and coronal plane CT imaging of the petrous temporal bones was
performed with thin-collimation image reconstruction. No intravenous
contrast was administered. Multiplanar CT image reconstructions were
also generated.

[Series 3: ax soft · axial · 0.32mm/px · z∈[+14,+36]mm · 2 of 55 slices shown]
[im 11/55  brain]
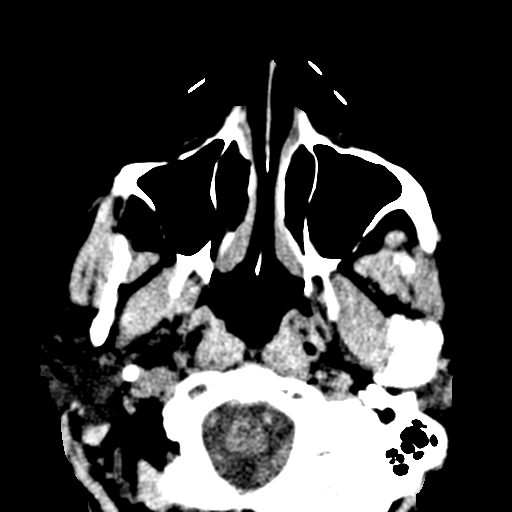
[im 22/55  brain]
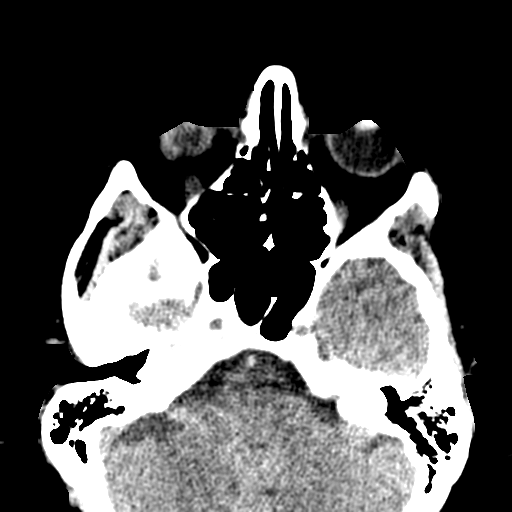

[Series 4: coronal bone. · coronal · 0.23mm/px · 3 of 300 slices shown]
[im 75/300  bone]
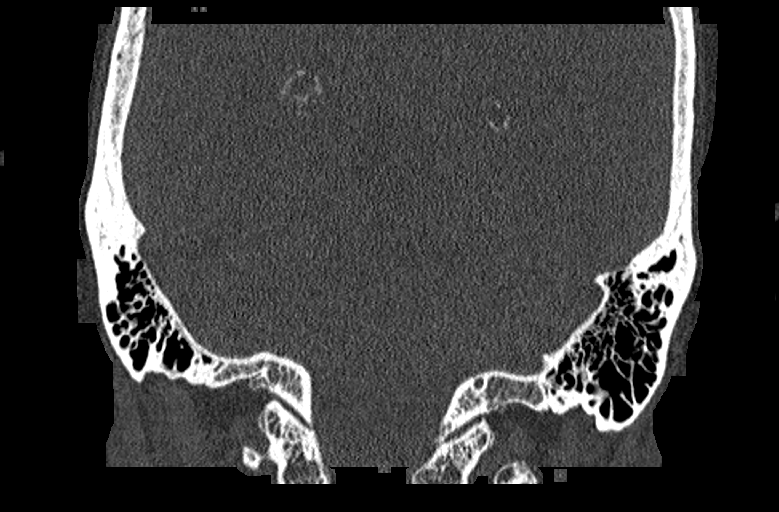
[im 150/300  bone]
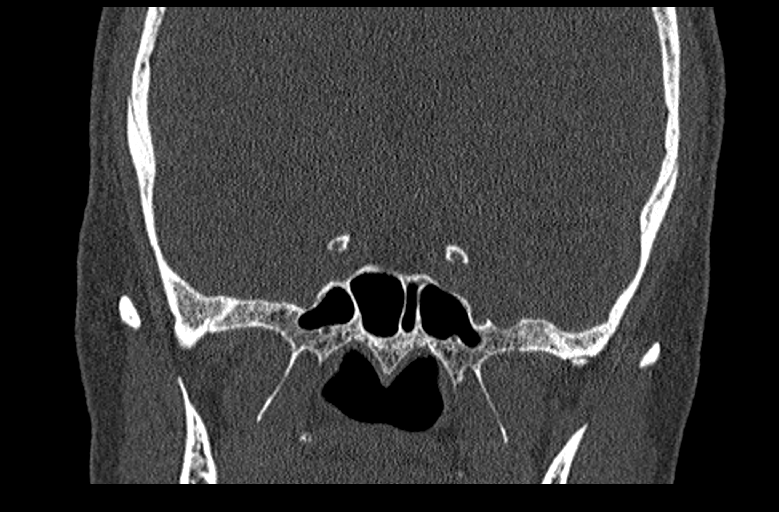
[im 225/300  bone]
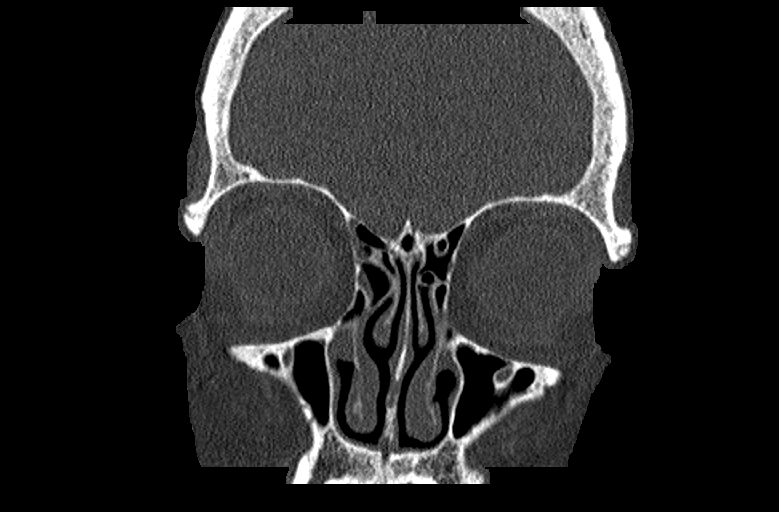

[Series 5: ax mag right · axial · 0.22mm/px · z∈[+2,+65]mm · 10 of 130 slices shown, 13 images]
[im 12/130  brain]
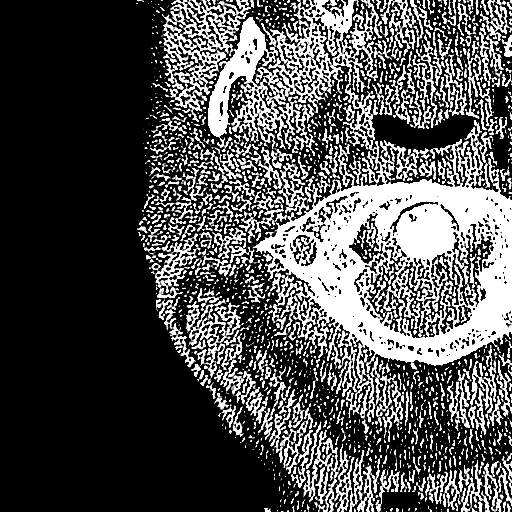
[im 12/130  bone]
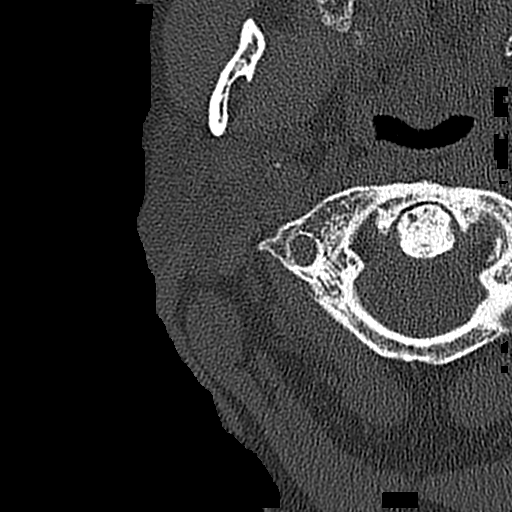
[im 24/130  bone]
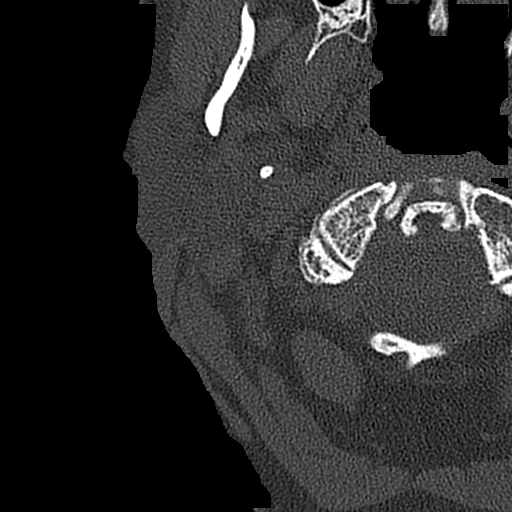
[im 36/130  bone]
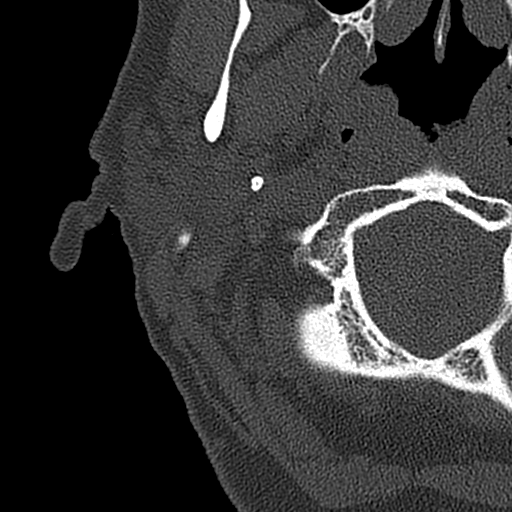
[im 47/130  bone]
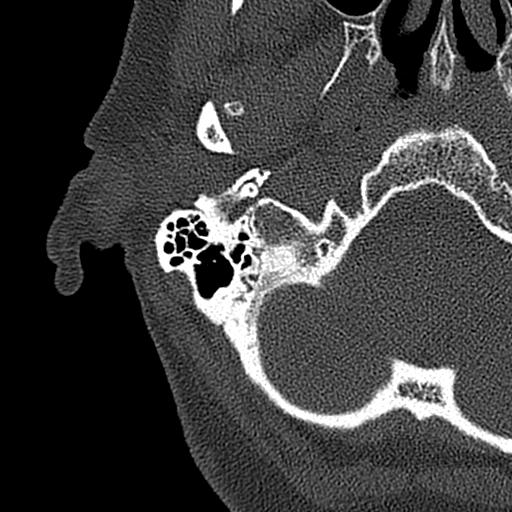
[im 59/130  brain]
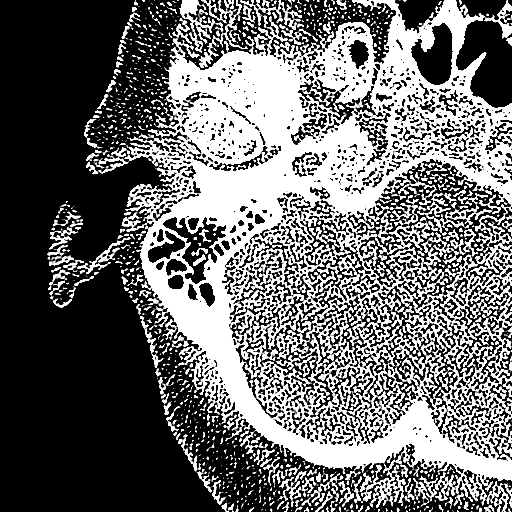
[im 59/130  bone]
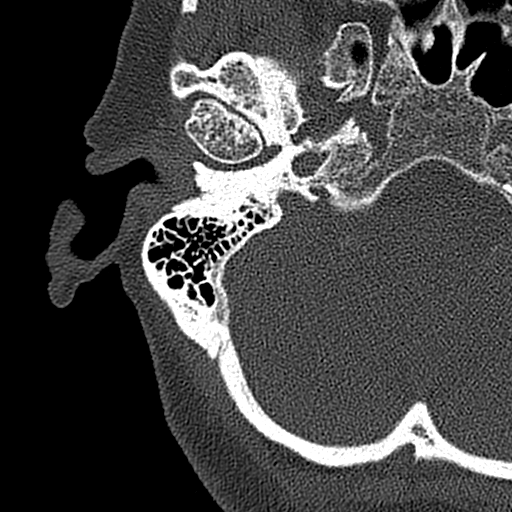
[im 71/130  bone]
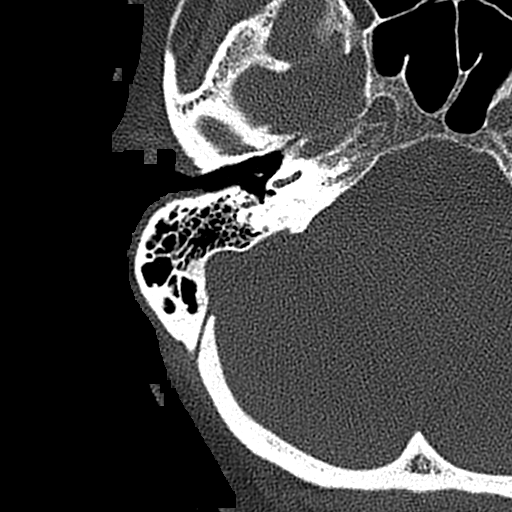
[im 83/130  bone]
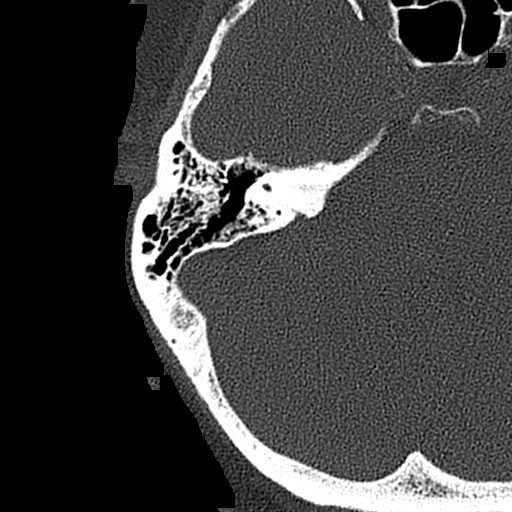
[im 94/130  bone]
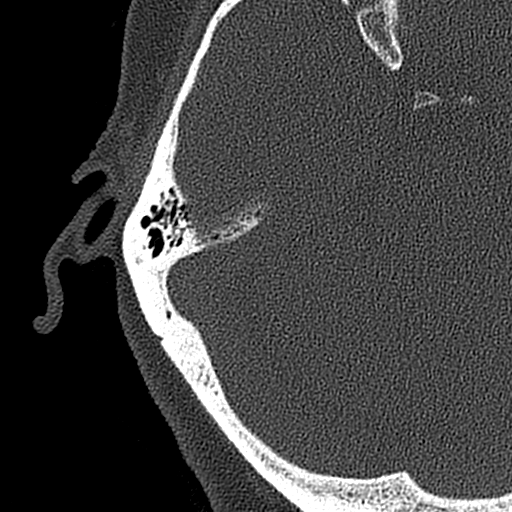
[im 106/130  brain]
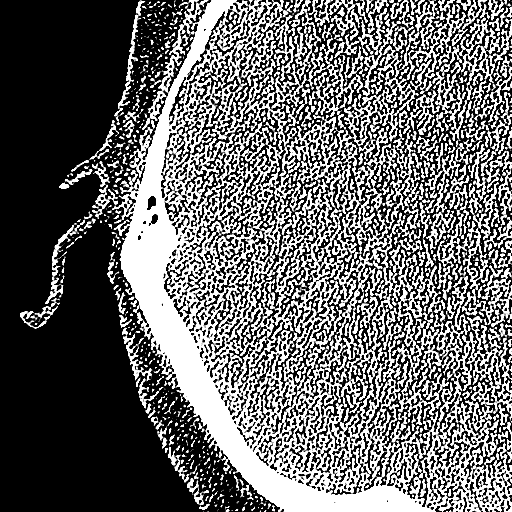
[im 106/130  bone]
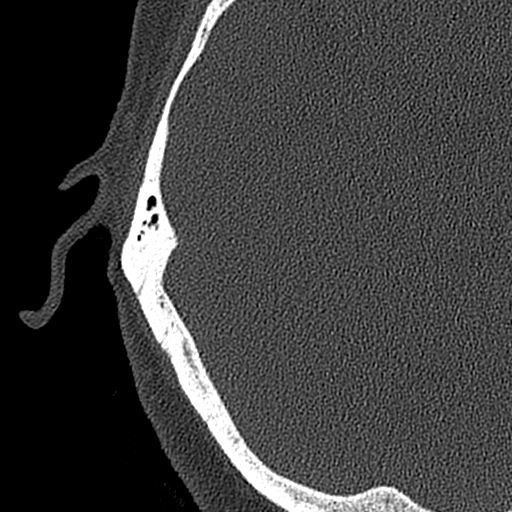
[im 118/130  bone]
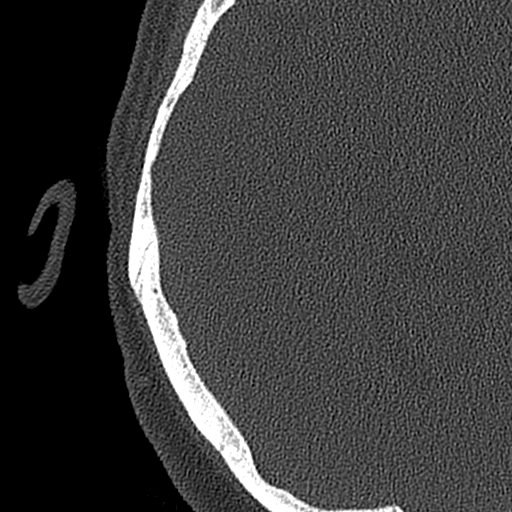

[15 of 40 positions shown; findings below may reference images not displayed]

FINDINGS: No temporal bone fracture is identified.

The external auditory canals are patent. The middle ear cavities are
well aerated. The ossicles are unremarkable. The inner ear
structures, internal auditory and facial nerve canals are normal.
The mastoid air cells are well aerated.

No acute orbital finding. Mild bilateral ethmoid and left maxillary
sinus mucosal thickening.
IMPRESSION: No evidence of temporal bone fracture.

Consider a non-contrast head CT for further evaluation if there is
concern for acute intracranial hemorrhage or traumatic brain injury.

Mild bilateral ethmoid and left maxillary sinus mucosal thickening.

## 2023-01-10 NOTE — Progress Notes (Unsigned)
Office Visit Note  Patient: Samantha Yates             Date of Birth: [birth year]           MRN: [id number]             PCP: Assunta Found, MD Referring: Assunta Found, MD Visit Date: 01/24/2023 Occupation: @GUAROCC @  Subjective:  Right elbow and left knee joint pain   History of Present Illness: Samantha Yates is a 62 y.o. female with history of seropositive rheumatoid arthritis and osteoarthritis.  Patient remains on Orencia Clickject 125 mg subcutaneous injection every 7 days.  She continues to tolerate Orencia without any side effects and has not missed any doses recently.  She denies any recent or recurrent infections.  Patient presents today with ongoing pain in the right elbow and left knee joint.  She denies any warmth or swelling.  She states that she took a prednisone taper on 11/19/2022 which righted temporary relief but her symptoms have returned.  She states she is having some nocturnal pain in the left knee joint.  She denies any other joint pain or joint swelling at this time.  Overall she feels that her rheumatoid arthritis remains well-controlled on Orencia and does not want to make any medication changes.  Activities of Daily Living:  Patient reports morning stiffness for 20-30 minutes.   Patient Reports nocturnal pain.  Difficulty dressing/grooming: Denies Difficulty climbing stairs: Reports Difficulty getting out of chair: Reports Difficulty using hands for taps, buttons, cutlery, and/or writing: Denies  Review of Systems  Constitutional:  Positive for fatigue.  HENT:  Negative for mouth sores and mouth dryness.   Eyes:  Positive for dryness.  Respiratory:  Negative for shortness of breath.   Cardiovascular:  Negative for chest pain and palpitations.  Gastrointestinal:  Negative for blood in stool, constipation and diarrhea.  Endocrine: Negative for increased urination.  Genitourinary:  Negative for involuntary urination.  Musculoskeletal:  Positive for joint  pain, gait problem, joint pain, joint swelling and morning stiffness. Negative for myalgias, muscle weakness, muscle tenderness and myalgias.  Skin:  Negative for color change, rash, hair loss and sensitivity to sunlight.  Allergic/Immunologic: Positive for susceptible to infections.  Neurological:  Negative for dizziness and headaches.  Hematological:  Negative for swollen glands.  Psychiatric/Behavioral:  Positive for sleep disturbance. Negative for depressed mood. The patient is not nervous/anxious.     PMFS History:  Patient Active Problem List   Diagnosis Date Noted   Special screening for malignant neoplasms, colon    High risk medication use 11/08/2016   Primary osteoarthritis of both hands 11/08/2016   Primary osteoarthritis of both feet 11/08/2016   History of gastroesophageal reflux (GERD) 11/08/2016   History of hypertension 11/08/2016   History of anxiety 11/08/2016   History of allergic rhinitis 11/08/2016   Former smoker 11/08/2016   GERD (gastroesophageal reflux disease) 09/17/2008   Rheumatoid arthritis (HCC) 09/17/2006   Anxiety 09/17/2006   Hypertension 09/17/2005   Allergic rhinitis 09/17/1996    Past Medical History:  Diagnosis Date   Arthritis    Hypertension    Rheumatoid arthritis (HCC)     Family History  Problem Relation Age of Onset   Colon polyps Mother    Stroke Mother    Diabetes Brother    Diabetes Brother    Hypertension Daughter    Diabetes Daughter    Healthy Son    Colon cancer Cousin    Past Surgical History:  Procedure Laterality Date   COLONOSCOPY N/A 04/09/2018   Procedure: COLONOSCOPY;  Surgeon: West Bali, MD;  Location: [facility];  Service: Endoscopy;  Laterality: N/A;  9:30   POLYPECTOMY  04/09/2018   Procedure: POLYPECTOMY;  Surgeon: West Bali, MD;  Location: [facility];  Service: Endoscopy;;  ascending, descending colon,rectal, transverse,   TONSILLECTOMY     Social History   Social History Narrative    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   Immunization History  Administered Date(s) Administered   [employer] Booster Vaccination 04/14/2021   Moderna Sars-Covid-2 Vaccination 12/04/2019, 01/05/2020, 05/12/2020   Tdap 04/03/2015     Objective: Vital Signs: BP 108/72 (BP Location: Left Arm, Patient Position: Sitting, Cuff Size: Large)   Pulse 93   Resp 15   Ht 5' 7.5" (1.715 m)   Wt 189 lb 3.2 oz (85.8 kg)   BMI 29.20 kg/m    Physical Exam Vitals and nursing note reviewed.  Constitutional:      Appearance: She is well-developed.  HENT:     Head: Normocephalic and atraumatic.  Eyes:     Conjunctiva/sclera: Conjunctivae normal.  Cardiovascular:     Rate and Rhythm: Normal rate and regular rhythm.     Heart sounds: Normal heart sounds.  Pulmonary:     Effort: Pulmonary effort is normal.     Breath sounds: Normal breath sounds.  Abdominal:     General: Bowel sounds are normal.     Palpations: Abdomen is soft.  Musculoskeletal:     Cervical back: Normal range of motion.  Lymphadenopathy:     Cervical: No cervical adenopathy.  Skin:    General: Skin is warm and dry.     Capillary Refill: Capillary refill takes less than 2 seconds.  Neurological:     Mental Status: She is alert and oriented to person, place, and time.  Psychiatric:        Behavior: Behavior normal.      Musculoskeletal Exam: C-spine, thoracic spine, and lumbar spine good ROM.  Shoulder joints, elbow joints, wrist joints, MCPs, PIPs, and DIPs good ROM with no synovitis.  Tenderness over the medial epicondyle of right elbow. Hip joints have good ROM with no discomfort.  Painful ROM of the left knee. Right knee has good ROM with no warmth or effusion.  Ankle joints have good ROM with no tenderness or joint swelling.  No tenderness or synovitis of MTP joints.   CDAI Exam: CDAI Score: 1.4  Patient Global: 2 mm; Provider Global: 2 mm Swollen: 0 ; Tender: 1  Joint Exam 01/24/2023      Right   Left  Knee      Tender     Investigation: No additional findings.  Imaging: No results found.  Recent Labs: Lab Results  Component Value Date   WBC 8.0 01/22/2023   HGB 12.5 01/22/2023   PLT 171 01/22/2023   NA 137 01/22/2023   K 4.4 01/22/2023   CL 101 01/22/2023   CO2 23 01/22/2023   GLUCOSE 109 (H) 01/22/2023   BUN 15 01/22/2023   CREATININE 0.88 01/22/2023   BILITOT 0.3 01/22/2023   ALKPHOS 74 01/22/2023   AST 17 01/22/2023   ALT 16 01/22/2023   PROT 6.2 01/22/2023   ALBUMIN 4.1 01/22/2023   CALCIUM 9.3 01/22/2023   GFRAA 81 07/28/2020   QFTBGOLDPLUS WILL FOLLOW 01/22/2023    Speciality Comments: No specialty comments available.  Procedures:  Large Joint Inj: L knee on 01/24/2023  9:32 AM Indications: pain Details: 27 G 1.5 in needle, medial approach  Arthrogram: No  Medications: 1.5 mL lidocaine 1 %; 40 mg triamcinolone acetonide 40 MG/ML Aspirate: 0 mL Outcome: tolerated well, no immediate complications Procedure, treatment alternatives, risks and benefits explained, specific risks discussed. Consent was given by the patient. Immediately prior to procedure a time out was called to verify the correct patient, procedure, equipment, support staff and site/side marked as required. Patient was prepped and draped in the usual sterile fashion.     Allergies: Sulfamethoxazole   Assessment / Plan:     Visit Diagnoses: Rheumatoid arthritis involving multiple sites with positive rheumatoid factor (HCC) - +RF,+anti-CCP with nodulosis: She has no synovitis on examination today.  She has not had any signs or symptoms of a rheumatoid arthritis flare.  She has clinically been doing well taking Orencia 125 mg subcutaneous injections once weekly.  She has been tolerating Orencia without any side effects or injection site reactions.  She continues to find Orencia to be effective at managing her symptoms.  No medication changes will be made at this time.  She was vies notify us  if she develops signs or symptoms of a flare.  She will follow-up in the office in 5 months or sooner if needed.  High risk medication use - Orencia Clickject 125 mg subcutaneous injection every 7 days. CBC and CMP were drawn on 01/22/23.  Her next lab work will be due in August and every 3 months to monitor for drug toxicity. TB gold pending.  No recent or recurrent infections.  Discussed the importance of holding Orencia if she develops signs or symptoms of infection and to resume once the infection has completely cleared.  Thrombocytopenia (HCC): Platelet count was within normal limits on 01/22/2023.  Primary osteoarthritis of both hands: PIP and DIP thickening consistent with osteoarthritis of both hands.  No tenderness or synovitis noted.  Complete fist formation bilaterally.  Chronic pain of left knee - Patient presents today with increased pain in the left knee joint.  No recent injury or fall.  She is not having difficulty climbing steps as well as rising from a seated position at times due to the discomfort.  She has not noticed any warmth or swelling.  She has not noticed any mechanical symptoms.  On examination she has painful range of motion of the left knee with some fullness but no effusion or warmth noted.  X-rays of the left knee were updated today.  After informed consent the left knee joint was injected with cortisone.  She tolerated procedure well.  Procedure note was completed above.  Aftercare was discussed.  She is advised notify us if her symptoms persist or worsen.  Plan: XR KNEE 3 VIEW LEFT, Large Joint Inj: L knee  Primary osteoarthritis of both feet: She is not experiencing any increased discomfort in her feet at this time.  No tenderness or synovitis over MTP joints.  Good range of motion of both ankle joints with no tenderness or synovitis.  Trochanteric bursitis of both hips: Not currently symptomatic.  Medial epicondylitis of right elbow: Patient presents today with  medial epicondylitis of the right elbow.  Different treatment options were discussed today in detail including physical therapy, home exercises, bracing, and the use of topical agents.  She declined referral to physical therapy at this time.  She is given handout of home exercises to perform.  She is strongly encouraged to purchase a brace to start wearing on a daily  basis.  She will notify us if her symptoms persist or worsen.  Other fatigue: Chronic, stable.  Other medical conditions are listed as follows:  History of anxiety  History of hypertension: Blood pressure was 108/72 today in the office.  History of gastroesophageal reflux (GERD)  History of allergic rhinitis  Former smoker    Orders: Orders Placed This Encounter  Procedures   Large Joint Inj: L knee   XR KNEE 3 VIEW LEFT   No orders of the defined types were placed in this encounter.     Follow-Up Instructions: Return in about 5 months (around 06/26/2023) for Rheumatoid arthritis.   Gearldine Bienenstock, PA-C  Note - This record has been created using Dragon software.  Chart creation errors have been sought, but may not always  have been located. Such creation errors do not reflect on  the standard of medical care.

## 2023-01-17 ENCOUNTER — Other Ambulatory Visit: Payer: Self-pay | Admitting: *Deleted

## 2023-01-17 DIAGNOSIS — Z9225 Personal history of immunosupression therapy: Secondary | ICD-10-CM

## 2023-01-17 DIAGNOSIS — Z111 Encounter for screening for respiratory tuberculosis: Secondary | ICD-10-CM

## 2023-01-17 DIAGNOSIS — Z79899 Other long term (current) drug therapy: Secondary | ICD-10-CM

## 2023-01-17 NOTE — Progress Notes (Signed)
Patient called and requested lab orders to be released.

## 2023-01-22 DIAGNOSIS — Z79899 Other long term (current) drug therapy: Secondary | ICD-10-CM | POA: Diagnosis not present

## 2023-01-22 DIAGNOSIS — Z9225 Personal history of immunosupression therapy: Secondary | ICD-10-CM | POA: Diagnosis not present

## 2023-01-23 LAB — CBC WITH DIFFERENTIAL/PLATELET
Hematocrit: 37.8 % (ref 34.0–46.6)
Lymphs: 42 %
Platelets: 171 10*3/uL (ref 150–450)
RDW: 12.6 % (ref 11.7–15.4)

## 2023-01-23 LAB — CMP14+EGFR
ALT: 16 IU/L (ref 0–32)
AST: 17 IU/L (ref 0–40)
Globulin, Total: 2.1 g/dL (ref 1.5–4.5)
Sodium: 137 mmol/L (ref 134–144)
eGFR: 74 mL/min/{1.73_m2} (ref >59)

## 2023-01-23 LAB — QUANTIFERON-TB GOLD PLUS

## 2023-01-24 ENCOUNTER — Encounter: Payer: Self-pay | Admitting: Physician Assistant

## 2023-01-24 ENCOUNTER — Ambulatory Visit: Payer: Medicare Other | Attending: Physician Assistant | Admitting: Physician Assistant

## 2023-01-24 ENCOUNTER — Ambulatory Visit (INDEPENDENT_AMBULATORY_CARE_PROVIDER_SITE_OTHER): Payer: Medicare Other

## 2023-01-24 VITALS — BP 108/72 | HR 93 | Resp 15 | Ht 67.5 in | Wt 189.2 lb

## 2023-01-24 DIAGNOSIS — M19042 Primary osteoarthritis, left hand: Secondary | ICD-10-CM

## 2023-01-24 DIAGNOSIS — M25562 Pain in left knee: Secondary | ICD-10-CM | POA: Diagnosis not present

## 2023-01-24 DIAGNOSIS — Z8679 Personal history of other diseases of the circulatory system: Secondary | ICD-10-CM

## 2023-01-24 DIAGNOSIS — Z8719 Personal history of other diseases of the digestive system: Secondary | ICD-10-CM | POA: Diagnosis not present

## 2023-01-24 DIAGNOSIS — Z8709 Personal history of other diseases of the respiratory system: Secondary | ICD-10-CM | POA: Diagnosis not present

## 2023-01-24 DIAGNOSIS — G8929 Other chronic pain: Secondary | ICD-10-CM

## 2023-01-24 DIAGNOSIS — M7061 Trochanteric bursitis, right hip: Secondary | ICD-10-CM

## 2023-01-24 DIAGNOSIS — M7701 Medial epicondylitis, right elbow: Secondary | ICD-10-CM | POA: Diagnosis not present

## 2023-01-24 DIAGNOSIS — M19072 Primary osteoarthritis, left ankle and foot: Secondary | ICD-10-CM

## 2023-01-24 DIAGNOSIS — M0579 Rheumatoid arthritis with rheumatoid factor of multiple sites without organ or systems involvement: Secondary | ICD-10-CM

## 2023-01-24 DIAGNOSIS — Z79899 Other long term (current) drug therapy: Secondary | ICD-10-CM

## 2023-01-24 DIAGNOSIS — M7062 Trochanteric bursitis, left hip: Secondary | ICD-10-CM

## 2023-01-24 DIAGNOSIS — R5383 Other fatigue: Secondary | ICD-10-CM | POA: Diagnosis not present

## 2023-01-24 DIAGNOSIS — M19071 Primary osteoarthritis, right ankle and foot: Secondary | ICD-10-CM | POA: Diagnosis not present

## 2023-01-24 DIAGNOSIS — Z8659 Personal history of other mental and behavioral disorders: Secondary | ICD-10-CM

## 2023-01-24 DIAGNOSIS — D696 Thrombocytopenia, unspecified: Secondary | ICD-10-CM

## 2023-01-24 DIAGNOSIS — Z87891 Personal history of nicotine dependence: Secondary | ICD-10-CM

## 2023-01-24 DIAGNOSIS — M19041 Primary osteoarthritis, right hand: Secondary | ICD-10-CM

## 2023-01-24 LAB — CBC WITH DIFFERENTIAL/PLATELET
Immature Grans (Abs): 0 10*3/uL (ref 0.0–0.1)
MCH: 30.3 pg (ref 26.6–33.0)
Monocytes: 8 %
RBC: 4.13 x10E6/uL (ref 3.77–5.28)

## 2023-01-24 LAB — CMP14+EGFR
Albumin: 4.1 g/dL (ref 3.9–4.9)
Alkaline Phosphatase: 74 IU/L (ref 44–121)
Bilirubin Total: 0.3 mg/dL (ref 0.0–1.2)
Calcium: 9.3 mg/dL (ref 8.7–10.3)
Chloride: 101 mmol/L (ref 96–106)

## 2023-01-24 LAB — QUANTIFERON-TB GOLD PLUS

## 2023-01-24 MED ORDER — TRIAMCINOLONE ACETONIDE 40 MG/ML IJ SUSP
40.0000 mg | INTRAMUSCULAR | Status: AC | PRN
Start: 2023-01-24 — End: 2023-01-24
  Administered 2023-01-24: 40 mg via INTRA_ARTICULAR

## 2023-01-24 MED ORDER — LIDOCAINE HCL 1 % IJ SOLN
1.5000 mL | INTRAMUSCULAR | Status: AC | PRN
Start: 2023-01-24 — End: 2023-01-24
  Administered 2023-01-24: 1.5 mL

## 2023-01-24 NOTE — Patient Instructions (Signed)
[facility] Ask your health care provider which exercises are safe for you. Do exercises exactly as told by your health care provider and adjust them as directed. It is normal to feel mild stretching, pulling, tightness, or discomfort as you do these exercises. Stop right away if you feel sudden pain or your pain gets worse. Do not begin these exercises until told by your health care provider. Stretching and range-of-motion exercises These exercises warm up your muscles and joints and improve the movement and flexibility of your elbow. Wrist extension, assisted  Straighten your left / right elbow in front of you with your palm facing up toward the ceiling. If told by your health care provider, bend your left / right elbow to a 90-degree angle (right angle) at your side instead of holding it straight. With your other hand, gently pull your left / right hand and fingers toward the floor (extension). Stop when you feel a gentle stretch on the palm side of your forearm. Hold this position for __________ seconds. Repeat __________ times. Complete this exercise __________ times a day. Wrist flexion, assisted  Straighten your left / right elbow in front of you with your palm facing down toward the floor. If told by your health care provider, bend your left / right elbow to a 90-degree angle (right angle) at your side instead of holding it straight. With your other hand, gently push over the back of your left / right hand so your fingers point toward the floor (flexion). Stop when you feel a gentle stretch on the back of your forearm. Hold this position for __________ seconds. Repeat __________ times. Complete this exercise __________ times a day. Assisted forearm rotation, supination Sit or stand with your elbows at your side. Bend your left / right elbow to a 90-degree angle (right angle). Using your uninjured hand, turn your left / right palm up toward the ceiling (supination) until you feel  a gentle stretch along the inside of your forearm. Hold this position for __________ seconds. Repeat __________ times. Complete this exercise __________ times a day. Assisted forearm rotation, pronation Sit or stand with your elbows at your side. Bend your left / right elbow to a 90-degree angle (right angle). Using your uninjured hand, turn your left / right palm down toward the floor (pronation) until you feel a gentle stretch along the top of your forearm. Hold this position for __________ seconds. Repeat __________ times. Complete this exercise __________ times a day. Strengthening exercises These exercises build strength and endurance in your elbow. Endurance is the ability to use your muscles for a long time, even after they get tired. Wrist flexion  Sit with your left / right forearm supported on a table or other surface and your palm turned up toward the ceiling. Let your left / right wrist extend over the edge of the surface. Hold a __________ weight or a piece of rubber exercise band or tubing. If using a rubber exercise band or tubing, hold the other end of the tubing with your other hand. Slowly bend your wrist so your hand moves up toward the ceiling (flexion). Try to only move your wrist and keep the rest of your arm still. Hold this position for __________ seconds. Slowly return to the starting position. Repeat __________ times. Complete this exercise __________ times a day. Wrist flexion, eccentric Sit with your left / right forearm palm-up and supported on a table or other surface. Let your left / right wrist extend over the   edge of the surface. Hold a __________ weight or a piece of rubber exercise band or tubing in your left / right hand. If using a rubber exercise band or tubing, hold the other end of the tubing with your other hand. Use your uninjured hand to move your left / right hand up toward the ceiling. Take your uninjured hand away and slowly return to the  starting position using only your left / right hand (eccentric flexion). Repeat __________ times. Complete this exercise __________ times a day. Forearm rotation, pronation To do this exercise, you will need a lightweight hammer or rubber mallet. Sit with your left / right forearm supported on a table or other surface. Bend your elbow to a 90-degree angle (right angle). Position your forearm so that your palm is facing up toward the ceiling, with your hand resting over the edge of the table. Hold a hammer in your left / right hand. To make this exercise easier, hold the hammer near the head of the hammer. To make this exercise harder, hold the hammer near the end of the handle. Without moving your elbow, slowly turn (rotate) your forearm so your palm faces down toward the floor (pronation). Hold this position for __________ seconds. Slowly return to the starting position. Repeat __________ times. Complete this exercise __________ times a day. Shoulder blade squeeze Sit in a stable chair or stand with good posture. If you are sitting down, do not let your back touch the back of the chair. Your arms should be at your sides with your elbows bent to a 90-degree angle (right angle). Position your forearms so that your thumbs are facing the ceiling (neutral position). Without lifting your shoulders up, squeeze your shoulder blades tightly together. Hold this position for __________ seconds. Slowly release and return to the starting position. Repeat __________ times. Complete this exercise __________ times a day. This information is not intended to replace advice given to you by your health care provider. Make sure you discuss any questions you have with your health care provider. Document Revised: 11/25/2019 Document Reviewed: 11/25/2019 Elsevier Patient Education  2023 [employer]  

## 2023-01-27 LAB — CBC WITH DIFFERENTIAL/PLATELET
Basophils Absolute: 0.1 10*3/uL (ref 0.0–0.2)
Basos: 1 %
EOS (ABSOLUTE): 0.3 10*3/uL (ref 0.0–0.4)
Eos: 3 %
Hemoglobin: 12.5 g/dL (ref 11.1–15.9)
Immature Granulocytes: 0 %
Lymphocytes Absolute: 3.4 10*3/uL — ABNORMAL HIGH (ref 0.7–3.1)
MCHC: 33.1 g/dL (ref 31.5–35.7)
MCV: 92 fL (ref 79–97)
Monocytes Absolute: 0.6 10*3/uL (ref 0.1–0.9)
Neutrophils Absolute: 3.7 10*3/uL (ref 1.4–7.0)
Neutrophils: 46 %
WBC: 8 10*3/uL (ref 3.4–10.8)

## 2023-01-27 LAB — CMP14+EGFR
Albumin/Globulin Ratio: 2 (ref 1.2–2.2)
BUN/Creatinine Ratio: 17 (ref 12–28)
BUN: 15 mg/dL (ref 8–27)
CO2: 23 mmol/L (ref 20–29)
Creatinine, Ser: 0.88 mg/dL (ref 0.57–1.00)
Glucose: 109 mg/dL — ABNORMAL HIGH (ref 70–99)
Potassium: 4.4 mmol/L (ref 3.5–5.2)
Total Protein: 6.2 g/dL (ref 6.0–8.5)

## 2023-01-27 LAB — QUANTIFERON-TB GOLD PLUS
QuantiFERON Mitogen Value: >10 IU/mL
QuantiFERON Nil Value: 0.06 IU/mL
QuantiFERON TB1 Ag Value: 0.08 IU/mL
QuantiFERON TB2 Ag Value: 0.07 IU/mL

## 2023-01-28 ENCOUNTER — Other Ambulatory Visit: Payer: Self-pay

## 2023-01-28 MED ORDER — ORENCIA CLICKJECT 125 MG/ML ~~LOC~~ SOAJ: 125.0000 mg | SUBCUTANEOUS | 0 refills | Status: DC

## 2023-01-28 NOTE — Progress Notes (Signed)
X-rays of the left knee are consistent with moderate OA and moderate chondromalacia patella.  Please notify the patient and clarify if the injection has alleviated some of her discomfort yet?

## 2023-01-28 NOTE — Telephone Encounter (Signed)
Last Fill: 11/21/2022  Labs: 01/22/2023 Glucose is 109. Rest of CMP WNL. Absolute lymphocytes are borderline elevated but stable. Rest of CBC WNL.  We will continue to monitor.  TB Gold: 01/22/2023    Next Visit: 07/11/2023  Last Visit: 01/24/2023  [id number] arthritis involving multiple sites with positive rheumatoid factor   Current Dose per office note 01/24/2023:  Orencia Clickject 125 mg subcutaneous injection every 7 days.   Okay to refill Orencia?

## 2023-01-31 ENCOUNTER — Telehealth: Payer: Self-pay | Admitting: Rheumatology

## 2023-01-31 ENCOUNTER — Other Ambulatory Visit: Payer: Self-pay | Admitting: *Deleted

## 2023-01-31 ENCOUNTER — Ambulatory Visit: Payer: Medicare Other | Admitting: Physician Assistant

## 2023-01-31 DIAGNOSIS — M25562 Pain in left knee: Secondary | ICD-10-CM

## 2023-01-31 DIAGNOSIS — G8929 Other chronic pain: Secondary | ICD-10-CM

## 2023-01-31 NOTE — Telephone Encounter (Signed)
I called patient, urgent referral for [facility] placed, patient will call to schedule appt.

## 2023-01-31 NOTE — Telephone Encounter (Signed)
Patient called stating she fell yesterday, 01/30/23 and injured her knees. Patient states her knees are painful & swollen and she has been icing them.  Patient states she doesn't have an orthopedic doctor and hasn't called her PCP yet.  Patient states she had a cortisone injection and x-rays last week at her appointment.  Patient requested a return call to let her know if she should have additional x-rays.

## 2023-02-26 NOTE — Telephone Encounter (Signed)
There is no contraindications for red light therapy in regards to her underlying RA.

## 2023-02-28 DIAGNOSIS — M069 Rheumatoid arthritis, unspecified: Secondary | ICD-10-CM | POA: Diagnosis not present

## 2023-02-28 DIAGNOSIS — N390 Urinary tract infection, site not specified: Secondary | ICD-10-CM | POA: Diagnosis not present

## 2023-02-28 DIAGNOSIS — I10 Essential (primary) hypertension: Secondary | ICD-10-CM | POA: Diagnosis not present

## 2023-04-23 ENCOUNTER — Encounter: Payer: Self-pay | Admitting: Orthopaedic Surgery

## 2023-04-23 ENCOUNTER — Ambulatory Visit: Payer: Medicare Other | Admitting: Orthopaedic Surgery

## 2023-04-23 DIAGNOSIS — G8929 Other chronic pain: Secondary | ICD-10-CM | POA: Diagnosis not present

## 2023-04-23 DIAGNOSIS — M25562 Pain in left knee: Secondary | ICD-10-CM

## 2023-04-23 MED ORDER — BUPIVACAINE HCL 0.5 % IJ SOLN
2.0000 mL | INTRAMUSCULAR | Status: AC | PRN
Start: 2023-04-23 — End: 2023-04-23
  Administered 2023-04-23: 2 mL via INTRA_ARTICULAR

## 2023-04-23 MED ORDER — LIDOCAINE HCL 1 % IJ SOLN
2.0000 mL | INTRAMUSCULAR | Status: AC | PRN
Start: 2023-04-23 — End: 2023-04-23
  Administered 2023-04-23: 2 mL

## 2023-04-23 MED ORDER — METHYLPREDNISOLONE ACETATE 40 MG/ML IJ SUSP
40.0000 mg | INTRAMUSCULAR | Status: AC | PRN
Start: 2023-04-23 — End: 2023-04-23
  Administered 2023-04-23: 40 mg via INTRA_ARTICULAR

## 2023-04-23 NOTE — Progress Notes (Signed)
Office Visit Note   Patient: Samantha Yates           Date of Birth: [birth year]           MRN: [id number] Visit Date: 04/23/2023              Requested by: Assunta Found, MD [address] [place],  [place] [address] PCP: Assunta Found, MD   Assessment & Plan: Visit Diagnoses:  1. Chronic pain of left knee     Plan: Samantha Yates is a 62 year old female with medial sided left knee pain that has not gotten better since falling in a hole in her yard.  Not really reporting mechanical symptoms at this time.  She would like to try another cortisone injection.  Impression is medial meniscal tear versus OA flare.  Follow-Up Instructions: No follow-ups on file.   Orders:  Orders Placed This Encounter  Procedures   Large Joint Inj: L knee   No orders of the defined types were placed in this encounter.     Procedures: Large Joint Inj: L knee on 04/23/2023 4:04 PM Details: 22 G needle Medications: 2 mL bupivacaine 0.5 %; 2 mL lidocaine 1 %; 40 mg methylPREDNISolone acetate 40 MG/ML Outcome: tolerated well, no immediate complications Patient was prepped and draped in the usual sterile fashion.       Clinical Data: No additional findings.   Subjective: Chief Complaint  Patient presents with   Left Knee - Pain    HPI Samantha Yates is a 62 year old female here for evaluation of left knee pain since May when she stepped in a hole in her yard.  She did have pain prior to the injury but it was made worse by it.  She felt like she twisted her knee and fell.  She had a cortisone injection a week before the fall.  She has rheumatoid arthritis.  She reports popping in the knee but denies any true mechanical symptoms.  Review of Systems  Constitutional: Negative.   HENT: Negative.    Eyes: Negative.   Respiratory: Negative.    Cardiovascular: Negative.   Endocrine: Negative.   Musculoskeletal: Negative.   Neurological: Negative.   Hematological: Negative.   Psychiatric/Behavioral:  Negative.    All other systems reviewed and are negative.    Objective: Vital Signs: There were no vitals taken for this visit.  Physical Exam Vitals and nursing note reviewed.  Constitutional:      Appearance: She is well-developed.  HENT:     Head: Normocephalic and atraumatic.  Pulmonary:     Effort: Pulmonary effort is normal.  Abdominal:     Palpations: Abdomen is soft.  Musculoskeletal:     Cervical back: Neck supple.  Skin:    General: Skin is warm.     Capillary Refill: Capillary refill takes less than 2 seconds.  Neurological:     Mental Status: She is alert and oriented to person, place, and time.  Psychiatric:        Behavior: Behavior normal.        Thought Content: Thought content normal.        Judgment: Judgment normal.     Ortho Exam Examination of the left knee shows medial joint line tenderness.  Pain with McMurray test.  No effusion.  Normal range of motion. Specialty Comments:  No specialty comments available.  Imaging: No results found.   PMFS History: Patient Active Problem List   Diagnosis Date Noted   Special screening for malignant neoplasms, colon  High risk medication use 11/08/2016   Primary osteoarthritis of both hands 11/08/2016   Primary osteoarthritis of both feet 11/08/2016   History of gastroesophageal reflux (GERD) 11/08/2016   History of hypertension 11/08/2016   History of anxiety 11/08/2016   History of allergic rhinitis 11/08/2016   Former smoker 11/08/2016   GERD (gastroesophageal reflux disease) 09/17/2008   Rheumatoid arthritis (HCC) 09/17/2006   Anxiety 09/17/2006   Hypertension 09/17/2005   Allergic rhinitis 09/17/1996   Past Medical History:  Diagnosis Date   Arthritis    Hypertension    Rheumatoid arthritis (HCC)     Family History  Problem Relation Age of Onset   Colon polyps Mother    Stroke Mother    Diabetes Brother    Diabetes Brother    Hypertension Daughter    Diabetes Daughter    Healthy  Son    Colon cancer Cousin     Past Surgical History:  Procedure Laterality Date   COLONOSCOPY N/A 04/09/2018   Procedure: COLONOSCOPY;  Surgeon: West Bali, MD;  [facility];  Service: Endoscopy;  Laterality: N/A;  9:30   POLYPECTOMY  04/09/2018   Procedure: POLYPECTOMY;  Surgeon: West Bali, MD;  Location: [facility];  Service: Endoscopy;;  ascending, descending colon,rectal, transverse,   TONSILLECTOMY     Social History   Occupational History   Not on file  Tobacco Use   Smoking status: Former    Current packs/day: 0.00    Average packs/day: 0.2 packs/day for 12.0 years (2.4 ttl pk-yrs)    Types: Cigarettes    Start date: 09/17/1986    Quit date: 09/17/1998    Years since quitting: 24.6    Passive exposure: Current   Smokeless tobacco: Never  Vaping Use   Vaping status: Never Used  Substance and Sexual Activity   Alcohol use: Not Currently   Drug use: No   Sexual activity: Not on file

## 2023-06-04 ENCOUNTER — Other Ambulatory Visit: Payer: Self-pay | Admitting: *Deleted

## 2023-06-04 DIAGNOSIS — Z79899 Other long term (current) drug therapy: Secondary | ICD-10-CM

## 2023-06-05 ENCOUNTER — Other Ambulatory Visit (HOSPITAL_COMMUNITY): Payer: Self-pay | Admitting: Family Medicine

## 2023-06-05 ENCOUNTER — Encounter (HOSPITAL_COMMUNITY): Payer: Self-pay | Admitting: Family Medicine

## 2023-06-05 DIAGNOSIS — M25562 Pain in left knee: Secondary | ICD-10-CM | POA: Diagnosis not present

## 2023-06-06 ENCOUNTER — Ambulatory Visit
Admission: RE | Admit: 2023-06-06 | Discharge: 2023-06-06 | Disposition: A | Discharge status: Home / Self Care | Payer: Medicare Other | Source: Ambulatory Visit | Attending: Family Medicine | Admitting: Family Medicine

## 2023-06-06 DIAGNOSIS — M25462 Effusion, left knee: Secondary | ICD-10-CM | POA: Diagnosis not present

## 2023-06-06 DIAGNOSIS — M25562 Pain in left knee: Secondary | ICD-10-CM | POA: Insufficient documentation

## 2023-06-06 DIAGNOSIS — S83232A Complex tear of medial meniscus, current injury, left knee, initial encounter: Secondary | ICD-10-CM | POA: Diagnosis not present

## 2023-06-06 DIAGNOSIS — M7122 Synovial cyst of popliteal space [Baker], left knee: Secondary | ICD-10-CM | POA: Diagnosis not present

## 2023-06-13 DIAGNOSIS — Z79899 Other long term (current) drug therapy: Secondary | ICD-10-CM | POA: Diagnosis not present

## 2023-06-14 LAB — CBC WITH DIFFERENTIAL/PLATELET
Basophils Absolute: 0.1 10*3/uL (ref 0.0–0.2)
Basos: 1 %
EOS (ABSOLUTE): 0.2 10*3/uL (ref 0.0–0.4)
Eos: 3 %
Hematocrit: 37.9 % (ref 34.0–46.6)
Hemoglobin: 12.3 g/dL (ref 11.1–15.9)
Immature Grans (Abs): 0 10*3/uL (ref 0.0–0.1)
Immature Granulocytes: 0 %
Lymphocytes Absolute: 2.7 10*3/uL (ref 0.7–3.1)
Lymphs: 38 %
MCH: 31.6 pg (ref 26.6–33.0)
MCHC: 32.5 g/dL (ref 31.5–35.7)
MCV: 97 fL (ref 79–97)
Monocytes Absolute: 0.6 10*3/uL (ref 0.1–0.9)
Monocytes: 9 %
Neutrophils Absolute: 3.5 10*3/uL (ref 1.4–7.0)
Neutrophils: 49 %
Platelets: 172 10*3/uL (ref 150–450)
RBC: 3.89 x10E6/uL (ref 3.77–5.28)
RDW: 12.8 % (ref 11.7–15.4)
WBC: 7 10*3/uL (ref 3.4–10.8)

## 2023-06-14 LAB — CMP14+EGFR
ALT: 14 [IU]/L (ref 0–32)
AST: 15 [IU]/L (ref 0–40)
Albumin: 4 g/dL (ref 3.9–4.9)
Alkaline Phosphatase: 70 [IU]/L (ref 44–121)
BUN/Creatinine Ratio: 17 (ref 12–28)
BUN: 16 mg/dL (ref 8–27)
Bilirubin Total: 0.4 mg/dL (ref 0.0–1.2)
CO2: 22 mmol/L (ref 20–29)
Calcium: 9.3 mg/dL (ref 8.7–10.3)
Chloride: 103 mmol/L (ref 96–106)
Creatinine, Ser: 0.92 mg/dL (ref 0.57–1.00)
Globulin, Total: 2 g/dL (ref 1.5–4.5)
Glucose: 107 mg/dL — ABNORMAL HIGH (ref 70–99)
Potassium: 4.9 mmol/L (ref 3.5–5.2)
Sodium: 137 mmol/L (ref 134–144)
Total Protein: 6 g/dL (ref 6.0–8.5)
eGFR: 70 mL/min/{1.73_m2} (ref >59)

## 2023-06-14 NOTE — Progress Notes (Signed)
CBC and CMP are normal.  Glucose is mildly elevated, probably not a fasting sample.

## 2023-06-24 ENCOUNTER — Other Ambulatory Visit: Payer: Self-pay | Admitting: *Deleted

## 2023-06-24 MED ORDER — ORENCIA CLICKJECT 125 MG/ML ~~LOC~~ SOAJ: 125.0000 mg | SUBCUTANEOUS | 0 refills | Status: DC

## 2023-06-24 NOTE — Telephone Encounter (Signed)
Last Fill: 01/28/2023  Labs: 06/13/2023 CBC and CMP are normal.  Glucose is mildly elevated, probably not a fasting sample   TB Gold: 01/22/2023  Negative   Next Visit: 07/11/2023  Last Visit: 01/24/2023  UJ:[id number] arthritis involving multiple sites with positive rheumatoid factor   Current Dose per office note 01/24/2023: Orencia Clickject 125 mg subcutaneous injection every 7 days   Okay to refill Orencia?

## 2023-06-28 DIAGNOSIS — M1712 Unilateral primary osteoarthritis, left knee: Secondary | ICD-10-CM | POA: Diagnosis not present

## 2023-07-01 NOTE — Progress Notes (Deleted)
Office Visit Note  Patient: Samantha Yates             Date of Birth: [birth year]           MRN: [id number]             PCP: Assunta Found, MD Referring: Assunta Found, MD Visit Date: 07/11/2023 Occupation: @GUAROCC @  Subjective:  No chief complaint on file.   History of Present Illness: Samantha Yates is a 62 y.o. female ***     Activities of Daily Living:  Patient reports morning stiffness for *** {minute/hour:19697}.   Patient {ACTIONS;DENIES/REPORTS:21021675::"Denies"} nocturnal pain.  Difficulty dressing/grooming: {ACTIONS;DENIES/REPORTS:21021675::"Denies"} Difficulty climbing stairs: {ACTIONS;DENIES/REPORTS:21021675::"Denies"} Difficulty getting out of chair: {ACTIONS;DENIES/REPORTS:21021675::"Denies"} Difficulty using hands for taps, buttons, cutlery, and/or writing: {ACTIONS;DENIES/REPORTS:21021675::"Denies"}  No Rheumatology ROS completed.   PMFS History:  Patient Active Problem List   Diagnosis Date Noted   Special screening for malignant neoplasms, colon    High risk medication use 11/08/2016   Primary osteoarthritis of both hands 11/08/2016   Primary osteoarthritis of both feet 11/08/2016   History of gastroesophageal reflux (GERD) 11/08/2016   History of hypertension 11/08/2016   History of anxiety 11/08/2016   History of allergic rhinitis 11/08/2016   Former smoker 11/08/2016   GERD (gastroesophageal reflux disease) 09/17/2008   Rheumatoid arthritis (HCC) 09/17/2006   Anxiety 09/17/2006   Hypertension 09/17/2005   Allergic rhinitis 09/17/1996    Past Medical History:  Diagnosis Date   Arthritis    Hypertension    Rheumatoid arthritis (HCC)     Family History  Problem Relation Age of Onset   Colon polyps Mother    Stroke Mother    Diabetes Brother    Diabetes Brother    Hypertension Daughter    Diabetes Daughter    Healthy Son    Colon cancer Cousin    Past Surgical History:  Procedure Laterality Date   COLONOSCOPY N/A 04/09/2018    Procedure: COLONOSCOPY;  Surgeon: West Bali, MD;  [facility];  Service: Endoscopy;  Laterality: N/A;  9:30   POLYPECTOMY  04/09/2018   Procedure: POLYPECTOMY;  Surgeon: West Bali, MD;  [facility]: [facility];  Service: Endoscopy;;  ascending, descending colon,rectal, transverse,   TONSILLECTOMY     Social History   Social History Narrative   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   Immunization History  Administered Date(s) Administered   eBay Booster Vaccination 04/14/2021   Moderna Sars-Covid-2 Vaccination 12/04/2019, 01/05/2020, 05/12/2020   Tdap 04/03/2015     Objective: Vital Signs: There were no vitals taken for this visit.   Physical Exam   Musculoskeletal Exam: ***  CDAI Exam: CDAI Score: -- Patient Global: --; Provider Global: -- Swollen: --; Tender: -- Joint Exam 07/11/2023   No joint exam has been documented for this visit   There is currently no information documented on the homunculus. Go to the Rheumatology activity and complete the homunculus joint exam.  Investigation: No additional findings.  Imaging: MR KNEE LEFT WO CONTRAST  Result Date: 06/20/2023 CLINICAL DATA:  Left knee pain since May EXAM: MRI OF THE LEFT KNEE WITHOUT CONTRAST TECHNIQUE: Multiplanar, multisequence MR imaging of the knee was performed. No intravenous contrast was administered. COMPARISON:  None Available. FINDINGS: MENISCI Medial: Complex tear of the posterior horn of the medial meniscus extending into the posterior body with peripheral meniscal extrusion. Lateral: Intact. LIGAMENTS Cruciates: ACL and PCL are intact. Subcortical reactive marrow edema at the ACL insertion. Collaterals: Medial collateral  ligament is intact. Lateral collateral ligament complex is intact. CARTILAGE Patellofemoral: Partial-thickness cartilage loss of the medial patellofemoral compartment with areas of full-thickness cartilage loss of the medial patellar facet.  Partial-thickness cartilage loss of the lateral patellofemoral compartment with a 5 x 12 mm of focal full-thickness chondral defect involving the lateral trochlea. Medial: Partial-thickness cartilage loss of the medial tibial plateau with areas of full-thickness cartilage loss. Full-thickness cartilage loss of the weight-bearing surface of the medial femoral condyle. Lateral: Partial thickness cartilage loss of the lateral femorotibial compartment. JOINT: Small joint effusion. Normal Hoffa's fat-pad. No plical thickening. POPLITEAL FOSSA: Popliteus tendon is intact. Small Baker's cyst. EXTENSOR MECHANISM: Intact quadriceps tendon. Intact patellar tendon. Intact lateral patellar retinaculum. Intact medial patellar retinaculum. Intact MPFL. BONES: No aggressive osseous lesion. No fracture or dislocation. Other: No fluid collection or hematoma. Muscles are normal. IMPRESSION: 1. Complex tear of the posterior horn of the medial meniscus extending into the posterior body with peripheral meniscal extrusion. 2. Tricompartmental cartilage abnormalities as described above most severe in the medial femorotibial compartment and medial patellofemoral compartment. Electronically Signed   By: Elige Ko M.D.   On: 06/20/2023 05:59    Recent Labs: Lab Results  Component Value Date   WBC 7.0 06/13/2023   HGB 12.3 06/13/2023   PLT 172 06/13/2023   NA 137 06/13/2023   K 4.9 06/13/2023   CL 103 06/13/2023   CO2 22 06/13/2023   GLUCOSE 107 (H) 06/13/2023   BUN 16 06/13/2023   CREATININE 0.92 06/13/2023   BILITOT 0.4 06/13/2023   ALKPHOS 70 06/13/2023   AST 15 06/13/2023   ALT 14 06/13/2023   PROT 6.0 06/13/2023   ALBUMIN 4.0 06/13/2023   CALCIUM 9.3 06/13/2023   GFRAA 81 07/28/2020   QFTBGOLDPLUS Negative 01/22/2023    Speciality Comments: No specialty comments available.  Procedures:  No procedures performed Allergies: Sulfamethoxazole   Assessment / Plan:     Visit Diagnoses: No diagnosis  found.  Orders: No orders of the defined types were placed in this encounter.  No orders of the defined types were placed in this encounter.   Face-to-face time spent with patient was *** minutes. Greater than 50% of time was spent in counseling and coordination of care.  Follow-Up Instructions: No follow-ups on file.   Ellen Henri, CMA  Note - This record has been created using Animal nutritionist.  Chart creation errors have been sought, but may not always  have been located. Such creation errors do not reflect on  the standard of medical care.

## 2023-07-05 DIAGNOSIS — M1712 Unilateral primary osteoarthritis, left knee: Secondary | ICD-10-CM | POA: Diagnosis not present

## 2023-07-11 ENCOUNTER — Ambulatory Visit: Payer: Medicare Other | Admitting: Rheumatology

## 2023-07-11 DIAGNOSIS — Z8659 Personal history of other mental and behavioral disorders: Secondary | ICD-10-CM

## 2023-07-11 DIAGNOSIS — Z79899 Other long term (current) drug therapy: Secondary | ICD-10-CM

## 2023-07-11 DIAGNOSIS — M0579 Rheumatoid arthritis with rheumatoid factor of multiple sites without organ or systems involvement: Secondary | ICD-10-CM

## 2023-07-11 DIAGNOSIS — M19071 Primary osteoarthritis, right ankle and foot: Secondary | ICD-10-CM

## 2023-07-11 DIAGNOSIS — Z8709 Personal history of other diseases of the respiratory system: Secondary | ICD-10-CM

## 2023-07-11 DIAGNOSIS — M7061 Trochanteric bursitis, right hip: Secondary | ICD-10-CM

## 2023-07-11 DIAGNOSIS — M19041 Primary osteoarthritis, right hand: Secondary | ICD-10-CM

## 2023-07-11 DIAGNOSIS — G8929 Other chronic pain: Secondary | ICD-10-CM

## 2023-07-11 DIAGNOSIS — Z8719 Personal history of other diseases of the digestive system: Secondary | ICD-10-CM

## 2023-07-11 DIAGNOSIS — R5383 Other fatigue: Secondary | ICD-10-CM

## 2023-07-11 DIAGNOSIS — Z87891 Personal history of nicotine dependence: Secondary | ICD-10-CM

## 2023-07-11 DIAGNOSIS — Z8679 Personal history of other diseases of the circulatory system: Secondary | ICD-10-CM

## 2023-07-11 DIAGNOSIS — M7701 Medial epicondylitis, right elbow: Secondary | ICD-10-CM

## 2023-07-11 DIAGNOSIS — D696 Thrombocytopenia, unspecified: Secondary | ICD-10-CM

## 2023-07-12 DIAGNOSIS — M1712 Unilateral primary osteoarthritis, left knee: Secondary | ICD-10-CM | POA: Diagnosis not present

## 2023-07-19 DIAGNOSIS — M1712 Unilateral primary osteoarthritis, left knee: Secondary | ICD-10-CM | POA: Diagnosis not present

## 2023-07-24 ENCOUNTER — Telehealth: Payer: Self-pay | Admitting: Pharmacist

## 2023-07-24 NOTE — Progress Notes (Deleted)
Office Visit Note  Patient: Samantha Yates             Date of Birth: 1960/11/21           MRN: [id number]             PCP: Assunta Found, MD Referring: Assunta Found, MD Visit Date: 08/06/2023 Occupation: @GUAROCC @  Subjective:  No chief complaint on file.   History of Present Illness: Samantha Yates is a 62 y.o. female ***     Activities of Daily Living:  Patient reports morning stiffness for *** {minute/hour:19697}.   Patient {ACTIONS;DENIES/REPORTS:21021675::"Denies"} nocturnal pain.  Difficulty dressing/grooming: {ACTIONS;DENIES/REPORTS:21021675::"Denies"} Difficulty climbing stairs: {ACTIONS;DENIES/REPORTS:21021675::"Denies"} Difficulty getting out of chair: {ACTIONS;DENIES/REPORTS:21021675::"Denies"} Difficulty using hands for taps, buttons, cutlery, and/or writing: {ACTIONS;DENIES/REPORTS:21021675::"Denies"}  No Rheumatology ROS completed.   PMFS History:  Patient Active Problem List   Diagnosis Date Noted   Special screening for malignant neoplasms, colon    High risk medication use 11/08/2016   Primary osteoarthritis of both hands 11/08/2016   Primary osteoarthritis of both feet 11/08/2016   History of gastroesophageal reflux (GERD) 11/08/2016   History of hypertension 11/08/2016   History of anxiety 11/08/2016   History of allergic rhinitis 11/08/2016   Former smoker 11/08/2016   GERD (gastroesophageal reflux disease) 09/17/2008   Rheumatoid arthritis (HCC) 09/17/2006   Anxiety 09/17/2006   Hypertension 09/17/2005   Allergic rhinitis 09/17/1996    Past Medical History:  Diagnosis Date   Arthritis    Hypertension    Rheumatoid arthritis (HCC)     Family History  Problem Relation Age of Onset   Colon polyps Mother    Stroke Mother    Diabetes Brother    Diabetes Brother    Hypertension Daughter    Diabetes Daughter    Healthy Son    Colon cancer Cousin    Past Surgical History:  Procedure Laterality Date   COLONOSCOPY N/A 04/09/2018    Procedure: COLONOSCOPY;  Surgeon: West Bali, MD;  [facility];  Service: Endoscopy;  Laterality: N/A;  9:30   POLYPECTOMY  04/09/2018   Procedure: POLYPECTOMY;  Surgeon: West Bali, MD;  [facility];  Service: Endoscopy;;  ascending, descending colon,rectal, transverse,   TONSILLECTOMY     Social History   Social History Narrative   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   Immunization History  Administered Date(s) Administered   eBay Booster Vaccination 04/14/2021   Moderna Sars-Covid-2 Vaccination 12/04/2019, 01/05/2020, 05/12/2020   Tdap 04/03/2015     Objective: Vital Signs: There were no vitals taken for this visit.   Physical Exam   Musculoskeletal Exam: ***  CDAI Exam: CDAI Score: -- Patient Global: --; Provider Global: -- Swollen: --; Tender: -- Joint Exam 08/06/2023   No joint exam has been documented for this visit   There is currently no information documented on the homunculus. Go to the Rheumatology activity and complete the homunculus joint exam.  Investigation: No additional findings.  Imaging: No results found.  Recent Labs: Lab Results  Component Value Date   WBC 7.0 06/13/2023   HGB 12.3 06/13/2023   PLT 172 06/13/2023   NA 137 06/13/2023   K 4.9 06/13/2023   CL 103 06/13/2023   CO2 22 06/13/2023   GLUCOSE 107 (H) 06/13/2023   BUN 16 06/13/2023   CREATININE 0.92 06/13/2023   BILITOT 0.4 06/13/2023   ALKPHOS 70 06/13/2023   AST 15 06/13/2023   ALT 14 06/13/2023   PROT  6.0 06/13/2023   ALBUMIN 4.0 06/13/2023   CALCIUM 9.3 06/13/2023   GFRAA 81 07/28/2020   QFTBGOLDPLUS Negative 01/22/2023    Speciality Comments: No specialty comments available.  Procedures:  No procedures performed Allergies: Sulfamethoxazole   Assessment / Plan:     Visit Diagnoses: No diagnosis found.  Orders: No orders of the defined types were placed in this encounter.  No orders of the defined types  were placed in this encounter.   Face-to-face time spent with patient was *** minutes. Greater than 50% of time was spent in counseling and coordination of care.  Follow-Up Instructions: No follow-ups on file.   Ellen Henri, CMA  Note - This record has been created using Animal nutritionist.  Chart creation errors have been sought, but may not always  have been located. Such creation errors do not reflect on  the standard of medical care.

## 2023-07-24 NOTE — Telephone Encounter (Signed)
Received notification that patient is due for [employer] PAP renewal for Orencia SQ. Patient portion mailed to patient's home. Provider portion and PA renewal will be completed once patient portion is returned   Of note regarding requirements:  If [employer] less than 150% of the federal poverty level, patients will be asked to provide proof of denial for the Medicare Part D LIS/Extra Help. They also require proof of OOP rx expenses   Chesley Mires, PharmD, MPH, BCPS, CPP Clinical Pharmacist (Rheumatology and Pulmonology)

## 2023-08-02 DIAGNOSIS — M1712 Unilateral primary osteoarthritis, left knee: Secondary | ICD-10-CM | POA: Diagnosis not present

## 2023-08-06 ENCOUNTER — Ambulatory Visit: Payer: Medicare Other | Admitting: Rheumatology

## 2023-08-06 DIAGNOSIS — Z8679 Personal history of other diseases of the circulatory system: Secondary | ICD-10-CM

## 2023-08-06 DIAGNOSIS — M19041 Primary osteoarthritis, right hand: Secondary | ICD-10-CM

## 2023-08-06 DIAGNOSIS — R5383 Other fatigue: Secondary | ICD-10-CM

## 2023-08-06 DIAGNOSIS — Z8719 Personal history of other diseases of the digestive system: Secondary | ICD-10-CM

## 2023-08-06 DIAGNOSIS — M7061 Trochanteric bursitis, right hip: Secondary | ICD-10-CM

## 2023-08-06 DIAGNOSIS — M7701 Medial epicondylitis, right elbow: Secondary | ICD-10-CM

## 2023-08-06 DIAGNOSIS — M0579 Rheumatoid arthritis with rheumatoid factor of multiple sites without organ or systems involvement: Secondary | ICD-10-CM

## 2023-08-06 DIAGNOSIS — D696 Thrombocytopenia, unspecified: Secondary | ICD-10-CM

## 2023-08-06 DIAGNOSIS — Z8659 Personal history of other mental and behavioral disorders: Secondary | ICD-10-CM

## 2023-08-06 DIAGNOSIS — M19072 Primary osteoarthritis, left ankle and foot: Secondary | ICD-10-CM

## 2023-08-06 DIAGNOSIS — G8929 Other chronic pain: Secondary | ICD-10-CM

## 2023-08-06 DIAGNOSIS — Z8709 Personal history of other diseases of the respiratory system: Secondary | ICD-10-CM

## 2023-08-06 DIAGNOSIS — Z79899 Other long term (current) drug therapy: Secondary | ICD-10-CM

## 2023-08-06 DIAGNOSIS — Z87891 Personal history of nicotine dependence: Secondary | ICD-10-CM

## 2023-08-09 NOTE — Progress Notes (Unsigned)
Office Visit Note  Patient: Samantha Yates             Date of Birth: 02-16-1961           MRN: [id number]             PCP: Assunta Found, MD Referring: Assunta Found, MD Visit Date: 08/14/2023 Occupation: @GUAROCC @  Subjective:  No chief complaint on file.   History of Present Illness: Samantha Yates is a 62 y.o. female ***     Activities of Daily Living:  Patient reports morning stiffness for *** {minute/hour:19697}.   Patient {ACTIONS;DENIES/REPORTS:21021675::"Denies"} nocturnal pain.  Difficulty dressing/grooming: {ACTIONS;DENIES/REPORTS:21021675::"Denies"} Difficulty climbing stairs: {ACTIONS;DENIES/REPORTS:21021675::"Denies"} Difficulty getting out of chair: {ACTIONS;DENIES/REPORTS:21021675::"Denies"} Difficulty using hands for taps, buttons, cutlery, and/or writing: {ACTIONS;DENIES/REPORTS:21021675::"Denies"}  No Rheumatology ROS completed.   PMFS History:  Patient Active Problem List   Diagnosis Date Noted   Special screening for malignant neoplasms, colon    High risk medication use 11/08/2016   Primary osteoarthritis of both hands 11/08/2016   Primary osteoarthritis of both feet 11/08/2016   History of gastroesophageal reflux (GERD) 11/08/2016   History of hypertension 11/08/2016   History of anxiety 11/08/2016   History of allergic rhinitis 11/08/2016   Former smoker 11/08/2016   GERD (gastroesophageal reflux disease) 09/17/2008   Rheumatoid arthritis (HCC) 09/17/2006   Anxiety 09/17/2006   Hypertension 09/17/2005   Allergic rhinitis 09/17/1996    Past Medical History:  Diagnosis Date   Arthritis    Hypertension    Rheumatoid arthritis (HCC)     Family History  Problem Relation Age of Onset   Colon polyps Mother    Stroke Mother    Diabetes Brother    Diabetes Brother    Hypertension Daughter    Diabetes Daughter    Healthy Son    Colon cancer Cousin    Past Surgical History:  Procedure Laterality Date   COLONOSCOPY N/A 04/09/2018    Procedure: COLONOSCOPY;  Surgeon: West Bali, MD;  [facility];  Service: Endoscopy;  Laterality: N/A;  9:30   POLYPECTOMY  04/09/2018   Procedure: POLYPECTOMY;  Surgeon: West Bali, MD;  [facility];  Service: Endoscopy;;  ascending, descending colon,rectal, transverse,   TONSILLECTOMY     Social History   Social History Narrative   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   Immunization History  Administered Date(s) Administered   eBay Booster Vaccination 04/14/2021   Moderna Sars-Covid-2 Vaccination 12/04/2019, 01/05/2020, 05/12/2020   Tdap 04/03/2015     Objective: Vital Signs: There were no vitals taken for this visit.   Physical Exam   Musculoskeletal Exam: ***  CDAI Exam: CDAI Score: -- Patient Global: --; Provider Global: -- Swollen: --; Tender: -- Joint Exam 08/14/2023   No joint exam has been documented for this visit   There is currently no information documented on the homunculus. Go to the Rheumatology activity and complete the homunculus joint exam.  Investigation: No additional findings.  Imaging: No results found.  Recent Labs: Lab Results  Component Value Date   WBC 7.0 06/13/2023   HGB 12.3 06/13/2023   PLT 172 06/13/2023   NA 137 06/13/2023   K 4.9 06/13/2023   CL 103 06/13/2023   CO2 22 06/13/2023   GLUCOSE 107 (H) 06/13/2023   BUN 16 06/13/2023   CREATININE 0.92 06/13/2023   BILITOT 0.4 06/13/2023   ALKPHOS 70 06/13/2023   AST 15 06/13/2023   ALT 14 06/13/2023   PROT  6.0 06/13/2023   ALBUMIN 4.0 06/13/2023   CALCIUM 9.3 06/13/2023   GFRAA 81 07/28/2020   QFTBGOLDPLUS Negative 01/22/2023    Speciality Comments: No specialty comments available.  Procedures:  No procedures performed Allergies: Sulfamethoxazole   Assessment / Plan:     Visit Diagnoses: No diagnosis found.  Orders: No orders of the defined types were placed in this encounter.  No orders of the defined types  were placed in this encounter.   Face-to-face time spent with patient was *** minutes. Greater than 50% of time was spent in counseling and coordination of care.  Follow-Up Instructions: No follow-ups on file.   Ellen Henri, CMA  Note - This record has been created using Animal nutritionist.  Chart creation errors have been sought, but may not always  have been located. Such creation errors do not reflect on  the standard of medical care.

## 2023-08-14 ENCOUNTER — Encounter: Payer: Self-pay | Admitting: Rheumatology

## 2023-08-14 ENCOUNTER — Telehealth: Payer: Self-pay

## 2023-08-14 ENCOUNTER — Ambulatory Visit: Payer: Medicare Other | Attending: Rheumatology | Admitting: Rheumatology

## 2023-08-14 VITALS — BP 132/79 | HR 83 | Resp 15 | Ht 68.0 in | Wt 184.6 lb

## 2023-08-14 DIAGNOSIS — Z8719 Personal history of other diseases of the digestive system: Secondary | ICD-10-CM | POA: Diagnosis not present

## 2023-08-14 DIAGNOSIS — M0579 Rheumatoid arthritis with rheumatoid factor of multiple sites without organ or systems involvement: Secondary | ICD-10-CM

## 2023-08-14 DIAGNOSIS — Z8709 Personal history of other diseases of the respiratory system: Secondary | ICD-10-CM

## 2023-08-14 DIAGNOSIS — M7061 Trochanteric bursitis, right hip: Secondary | ICD-10-CM

## 2023-08-14 DIAGNOSIS — Z8679 Personal history of other diseases of the circulatory system: Secondary | ICD-10-CM | POA: Diagnosis not present

## 2023-08-14 DIAGNOSIS — M19071 Primary osteoarthritis, right ankle and foot: Secondary | ICD-10-CM

## 2023-08-14 DIAGNOSIS — Z87891 Personal history of nicotine dependence: Secondary | ICD-10-CM

## 2023-08-14 DIAGNOSIS — Z8659 Personal history of other mental and behavioral disorders: Secondary | ICD-10-CM

## 2023-08-14 DIAGNOSIS — D696 Thrombocytopenia, unspecified: Secondary | ICD-10-CM | POA: Diagnosis not present

## 2023-08-14 DIAGNOSIS — M7701 Medial epicondylitis, right elbow: Secondary | ICD-10-CM

## 2023-08-14 DIAGNOSIS — M25562 Pain in left knee: Secondary | ICD-10-CM | POA: Diagnosis not present

## 2023-08-14 DIAGNOSIS — M19072 Primary osteoarthritis, left ankle and foot: Secondary | ICD-10-CM

## 2023-08-14 DIAGNOSIS — M19041 Primary osteoarthritis, right hand: Secondary | ICD-10-CM | POA: Diagnosis not present

## 2023-08-14 DIAGNOSIS — G8929 Other chronic pain: Secondary | ICD-10-CM

## 2023-08-14 DIAGNOSIS — M19042 Primary osteoarthritis, left hand: Secondary | ICD-10-CM

## 2023-08-14 DIAGNOSIS — R5383 Other fatigue: Secondary | ICD-10-CM

## 2023-08-14 DIAGNOSIS — Z79899 Other long term (current) drug therapy: Secondary | ICD-10-CM

## 2023-08-14 DIAGNOSIS — M7062 Trochanteric bursitis, left hip: Secondary | ICD-10-CM

## 2023-08-14 MED ORDER — LIDOCAINE HCL 1 % IJ SOLN
1.5000 mL | INTRAMUSCULAR | Status: AC | PRN
Start: 2023-08-14 — End: 2023-08-14
  Administered 2023-08-14: 1.5 mL

## 2023-08-14 MED ORDER — TRIAMCINOLONE ACETONIDE 40 MG/ML IJ SUSP
40.0000 mg | INTRAMUSCULAR | Status: AC | PRN
Start: 2023-08-14 — End: 2023-08-14
  Administered 2023-08-14: 40 mg via INTRA_ARTICULAR

## 2023-08-14 NOTE — Telephone Encounter (Signed)
Patient contacted the office inquiring if she should get the RSV vaccine. Advised the patient to consult with her PCP. Patient verbalized understanding.

## 2023-08-14 NOTE — Patient Instructions (Signed)
Standing Labs We placed an order today for your standing lab work.   Please have your standing labs drawn in December and every 3 months  Please have your labs drawn 2 weeks prior to your appointment so that the provider can discuss your lab results at your appointment, if possible.  Please note that you may see your imaging and lab results in MyChart before we have reviewed them. We will contact you once all results are reviewed. Please allow our office up to 72 hours to thoroughly review all of the results before contacting the office for clarification of your results.  WALK-IN LAB HOURS  Monday through Thursday from 8:00 am -12:30 pm and 1:00 pm-5:00 pm and Friday from 8:00 am-12:00 pm.  Patients with office visits requiring labs will be seen before walk-in labs.  You may encounter longer than normal wait times. Please allow additional time. Wait times may be shorter on  Monday and Thursday afternoons.  We do not book appointments for walk-in labs. We appreciate your patience and understanding with our staff.   Labs are drawn by [facility]. Please bring your co-pay at the time of your lab draw.  You may receive a bill from [facility] for your lab work.  Please note if you are on Hydroxychloroquine and and an order has been placed for a Hydroxychloroquine level,  you will need to have it drawn 4 hours or more after your last dose.  If you wish to have your labs drawn at another location, please call the office 24 hours in advance so we can fax the orders.  The office is located at [address], [address], [place], [place] [address]   If you have any questions regarding directions or hours of operation,  please call [phone].   As a reminder, please drink plenty of water prior to coming for your lab work. Thanks!   Vaccines You are taking a medication(s) that can suppress your immune system.  The following immunizations are recommended: Flu annually Covid-19  Td/Tdap (tetanus,  diphtheria, pertussis) every 10 years Pneumonia (Prevnar 15 then Pneumovax 23 at least 1 year apart.  Alternatively, can take Prevnar 20 without needing additional dose) Shingrix: 2 doses from 4 weeks to 6 months apart  Please check with your PCP to make sure you are up to date. If you have signs or symptoms of an infection or start antibiotics: First, call your PCP for workup of your infection. Hold your medication through the infection, until you complete your antibiotics, and until symptoms resolve if you take the following: Injectable medication (Actemra, Benlysta, Cimzia, Cosentyx, Enbrel, Humira, Kevzara, Orencia, Remicade, Simponi, Stelara, Taltz, Tremfya) Methotrexate Leflunomide (Arava) Mycophenolate (Cellcept) Harriette Ohara, Olumiant, or Rinvoq

## 2023-08-19 NOTE — Telephone Encounter (Signed)
Received signed patient forms with income documents. Provider form placed in Dr. Fatima Sanger folder for signature  Submitted a Prior Authorization request to [facility] for ORENCIA SQ via CoverMyMeds. Will update once we receive a response.  Key: [id number]

## 2023-08-19 NOTE — Telephone Encounter (Signed)
Received notification from [facility] regarding a prior authorization for ORENCIA SQ. Authorization has been APPROVED from 08/19/2023 to 09/16/2024. Approval letter sent to scan center.  Authorization # [id number]  Chesley Mires, PharmD, MPH, BCPS, CPP Clinical Pharmacist (Rheumatology and Pulmonology)

## 2023-08-22 NOTE — Telephone Encounter (Signed)
Received signed provider form. Submitted Patient Assistance RENEWAL Application to [employer]MS for ORENCIA SQ along with provider portion, patient portion, PA, medication list, insurance card copy and income documents. Will update patient when we receive a response.  Phone #: [phone] Fax #: [phone]  Chesley Mires, PharmD, MPH, BCPS, CPP Clinical Pharmacist (Rheumatology and Pulmonology)

## 2023-09-10 NOTE — Telephone Encounter (Deleted)
Orencia PAP renewal

## 2023-09-13 NOTE — Telephone Encounter (Signed)
Received fax from [employer]MS stating patient has been denied patient assistance for SQ Orencia due to not meeting requirement of  3% of household income needing to spent on OOP rx expenses.   Patient will likely have to enroll into Medicare prescription Payment Plan.   Letter does state that there is option for appeal which patient needs to call [employer]MS to pursue  Case # [id number] Phone: [phone] Fax: [phone]   Chesley Mires, PharmD, MPH, BCPS, CPP Clinical Pharmacist (Rheumatology and Pulmonology)

## 2023-09-17 ENCOUNTER — Other Ambulatory Visit: Payer: Self-pay | Admitting: *Deleted

## 2023-09-17 DIAGNOSIS — M0579 Rheumatoid arthritis with rheumatoid factor of multiple sites without organ or systems involvement: Secondary | ICD-10-CM

## 2023-09-17 DIAGNOSIS — Z79899 Other long term (current) drug therapy: Secondary | ICD-10-CM

## 2023-09-17 MED ORDER — ORENCIA CLICKJECT 125 MG/ML ~~LOC~~ SOAJ: 125.0000 mg | SUBCUTANEOUS | 0 refills | Status: DC

## 2023-09-17 NOTE — Telephone Encounter (Signed)
 Please schedule patient a follow up visit. Patient due April 2025. Thanks!    Follow-Up Instructions: Return in about 5 months (around 01/12/2024) for Rheumatoid arthritis, Osteoarthritis.

## 2023-09-17 NOTE — Telephone Encounter (Signed)
 Patient contacted to request refill on Orencia .  Patient advised she is due to update labs and plans to update them on Friday. Orders released.   Last Fill: 06/24/2023  Labs: 06/13/2023 CBC and CMP are normal.  Glucose is mildly elevated, probably not a fasting sample.   TB Gold: 01/22/2023   Next Visit: Due April 2025. Message sent to the front to schedule.   Last Visit: 08/14/2023  DX: Rheumatoid arthritis involving multiple sites with positive rheumatoid factor   Current Dose per office note 08/14/2023: Orencia  Clickject 125 mg subcutaneous injection every 7 days   Okay to refill Orencia ?

## 2023-09-19 NOTE — Telephone Encounter (Signed)
 Patient states she will call back to schedule once she reviews her grandchildren's school calendar.

## 2023-09-20 DIAGNOSIS — Z0001 Encounter for general adult medical examination with abnormal findings: Secondary | ICD-10-CM | POA: Diagnosis not present

## 2023-09-20 DIAGNOSIS — M25562 Pain in left knee: Secondary | ICD-10-CM | POA: Diagnosis not present

## 2023-09-20 DIAGNOSIS — I10 Essential (primary) hypertension: Secondary | ICD-10-CM | POA: Diagnosis not present

## 2023-09-20 DIAGNOSIS — Z79899 Other long term (current) drug therapy: Secondary | ICD-10-CM | POA: Diagnosis not present

## 2023-09-20 DIAGNOSIS — M0689 Other specified rheumatoid arthritis, multiple sites: Secondary | ICD-10-CM | POA: Diagnosis not present

## 2023-09-21 LAB — CBC WITH DIFFERENTIAL/PLATELET
Basophils Absolute: 0.1 10*3/uL (ref 0.0–0.2)
Basos: 1 %
EOS (ABSOLUTE): 0.2 10*3/uL (ref 0.0–0.4)
Eos: 3 %
Hematocrit: 38.7 % (ref 34.0–46.6)
Hemoglobin: 12.8 g/dL (ref 11.1–15.9)
Immature Grans (Abs): 0 10*3/uL (ref 0.0–0.1)
Immature Granulocytes: 1 %
Lymphocytes Absolute: 2.8 10*3/uL (ref 0.7–3.1)
Lymphs: 36 %
MCH: 31.1 pg (ref 26.6–33.0)
MCHC: 33.1 g/dL (ref 31.5–35.7)
MCV: 94 fL (ref 79–97)
Monocytes Absolute: 0.5 10*3/uL (ref 0.1–0.9)
Monocytes: 7 %
Neutrophils Absolute: 4.2 10*3/uL (ref 1.4–7.0)
Neutrophils: 52 %
Platelets: 233 10*3/uL (ref 150–450)
RBC: 4.12 x10E6/uL (ref 3.77–5.28)
RDW: 12.7 % (ref 11.7–15.4)
WBC: 7.9 10*3/uL (ref 3.4–10.8)

## 2023-09-21 LAB — CMP14+EGFR
ALT: 12 [IU]/L (ref 0–32)
AST: 15 [IU]/L (ref 0–40)
Albumin: 4.3 g/dL (ref 3.9–4.9)
Alkaline Phosphatase: 76 [IU]/L (ref 44–121)
BUN/Creatinine Ratio: 18 (ref 12–28)
BUN: 16 mg/dL (ref 8–27)
Bilirubin Total: 0.3 mg/dL (ref 0.0–1.2)
CO2: 25 mmol/L (ref 20–29)
Calcium: 9.7 mg/dL (ref 8.7–10.3)
Chloride: 100 mmol/L (ref 96–106)
Creatinine, Ser: 0.91 mg/dL (ref 0.57–1.00)
Globulin, Total: 2.2 g/dL (ref 1.5–4.5)
Glucose: 93 mg/dL (ref 70–99)
Potassium: 4.8 mmol/L (ref 3.5–5.2)
Sodium: 136 mmol/L (ref 134–144)
Total Protein: 6.5 g/dL (ref 6.0–8.5)
eGFR: 70 mL/min/{1.73_m2} (ref >59)

## 2023-09-22 NOTE — Progress Notes (Signed)
 CBC and CMP are normal.

## 2023-09-24 ENCOUNTER — Other Ambulatory Visit: Payer: Self-pay

## 2023-09-24 NOTE — Telephone Encounter (Signed)
 Spoke with patient and informed her of Orencia  PAP application denial. She was given the number to contact to appeal the decision ([phone]). Test claim revealed first monthly co-pay of $1970. She was also informed of the option to enroll in the [facility] Prescription Payment Plan. She stated she will contact the clinic when she hears back from Orencia  PAP or if she decides to sign up for the prescription payment plan.   Tolu Ciin Brazzel, PharmD [facility] PGY-1

## 2023-09-27 NOTE — Telephone Encounter (Signed)
 Patient enrolled into RA grant through PAF Award Period: 03/31/2023 - 09/26/2024 ID: [id number] BIN: [id number] PCN: [id number] Group: [id number] For pharmacy inquiries, contact PDMI at [phone].

## 2023-09-30 MED ORDER — ORENCIA CLICKJECT 125 MG/ML ~~LOC~~ SOAJ
125.0000 mg | SUBCUTANEOUS | 0 refills | Status: DC
  Filled 2023-10-15: qty 8, 56d supply, fill #0

## 2023-09-30 NOTE — Telephone Encounter (Addendum)
 ATC patient to discuss grant. Left VM requesting return call  Chesley Mires, PharmD, MPH, BCPS, CPP Clinical Pharmacist (Rheumatology and Pulmonology)

## 2023-09-30 NOTE — Addendum Note (Signed)
 Addended by: Murrell Redden on: 09/30/2023 08:39 AM   Modules accepted: Orders

## 2023-09-30 NOTE — Telephone Encounter (Signed)
 Patient returned call - she states she has two doses remaining (will use last Orencia  dose on 10/12/23). She will Orencia  shipment by dose due date on 10/19/2023  Rx sent to [facility]  Sherry Pennant, PharmD, MPH, BCPS, CPP Clinical Pharmacist (Rheumatology and Pulmonology)

## 2023-10-10 ENCOUNTER — Telehealth: Payer: Self-pay | Admitting: *Deleted

## 2023-10-10 ENCOUNTER — Other Ambulatory Visit: Payer: Self-pay

## 2023-10-10 NOTE — Telephone Encounter (Signed)
Samantha Yates will be reaching out to patient on Monday 10/14/2023

## 2023-10-10 NOTE — Telephone Encounter (Signed)
Patient contacted the office regarding her Orencia prescription. Patient states she tried contacting the pharmacy and was advised that the prescription has not been released to the pharmacy. Please reach out to the patient and advise.

## 2023-10-11 ENCOUNTER — Other Ambulatory Visit: Payer: Self-pay | Admitting: Pharmacist

## 2023-10-11 NOTE — Progress Notes (Signed)
Patient previously receiving Orencia through [employer]MS however no longer eligible for PAP. Enrolled into RA grant.  Will continue Orencia 125mg  SQ once weekly as monotherapy  Chesley Mires, PharmD, MPH, BCPS, CPP Clinical Pharmacist (Rheumatology and Pulmonology)

## 2023-10-14 ENCOUNTER — Telehealth: Payer: Self-pay | Admitting: *Deleted

## 2023-10-14 NOTE — Telephone Encounter (Signed)
[name] left VM for patient this afternoon to set up shipment of Orencia to patient. Left VM with his callback number

## 2023-10-14 NOTE — Telephone Encounter (Signed)
Patient contacted the office and left message requesting a call back from of the pharmacy team. Patient's call back number is [phone].

## 2023-10-15 ENCOUNTER — Other Ambulatory Visit: Payer: Self-pay

## 2023-10-15 ENCOUNTER — Other Ambulatory Visit (HOSPITAL_COMMUNITY): Payer: Self-pay

## 2023-10-15 NOTE — Progress Notes (Signed)
Specialty Pharmacy Initial Fill Coordination Note  Samantha Yates is a 63 y.o. female contacted today regarding initial fill of specialty medication(s) Abatacept (Orencia ClickJect)   Patient requested Delivery   Delivery date: 10/17/23   Verified address: [address]   [name] [facility] [address]   Medication will be filled on 10/16/23.   Patient has grant on file and is aware of $0 copayment.

## 2023-10-24 ENCOUNTER — Other Ambulatory Visit: Payer: Self-pay

## 2023-10-31 ENCOUNTER — Other Ambulatory Visit: Payer: Self-pay

## 2023-11-08 DIAGNOSIS — M1712 Unilateral primary osteoarthritis, left knee: Secondary | ICD-10-CM | POA: Diagnosis not present

## 2023-11-11 ENCOUNTER — Encounter: Payer: Self-pay | Admitting: *Deleted

## 2023-11-15 DIAGNOSIS — J069 Acute upper respiratory infection, unspecified: Secondary | ICD-10-CM | POA: Diagnosis not present

## 2023-11-25 ENCOUNTER — Other Ambulatory Visit (HOSPITAL_COMMUNITY): Payer: Self-pay

## 2023-11-26 ENCOUNTER — Other Ambulatory Visit: Payer: Self-pay

## 2023-11-26 ENCOUNTER — Other Ambulatory Visit: Payer: Self-pay | Admitting: Rheumatology

## 2023-11-26 ENCOUNTER — Other Ambulatory Visit (HOSPITAL_COMMUNITY): Payer: Self-pay

## 2023-11-26 DIAGNOSIS — M0579 Rheumatoid arthritis with rheumatoid factor of multiple sites without organ or systems involvement: Secondary | ICD-10-CM

## 2023-11-26 DIAGNOSIS — Z79899 Other long term (current) drug therapy: Secondary | ICD-10-CM

## 2023-11-26 MED ORDER — ORENCIA CLICKJECT 125 MG/ML ~~LOC~~ SOAJ
125.0000 mg | SUBCUTANEOUS | 0 refills | Status: DC
  Filled 2023-11-26: qty 4, 28d supply, fill #0
  Filled 2023-12-30: qty 4, 28d supply, fill #1
  Filled 2024-02-05: qty 4, 28d supply, fill #2

## 2023-11-26 NOTE — Telephone Encounter (Signed)
 Last Fill: 09/30/2023 (60 day supply)  Labs: 09/20/2023 CBC and CMP are normal.   TB Gold: 01/22/2023 Neg    Next Visit: Due April 2025. Message sent to the front to schedule.   Last Visit: 08/14/2023  JY:[id number] arthritis involving multiple sites with positive rheumatoid factor   Current Dose per office note 08/14/2023:  Orencia Clickject 125 mg subcutaneous injection every 7 days   Okay to refill Orencia?

## 2023-11-26 NOTE — Telephone Encounter (Signed)
 Please schedule patient a follow up visit. Patient due April 2025. Thanks!    Follow-Up Instructions: Return in about 5 months (around 01/12/2024) for Rheumatoid arthritis, Osteoarthritis.

## 2023-11-26 NOTE — Progress Notes (Signed)
 Specialty Pharmacy Refill Coordination Note  Samantha Yates is a 63 y.o. female contacted today regarding refills of specialty medication(s) No data recorded  Patient requested (Patient-Rptd) Delivery   Delivery date: (Patient-Rptd) 12/10/23   Verified address: (Patient-Rptd) [address] [place], [place] [address]   Medication will be filled on 03.24.25.   This fill date is pending response to refill request from provider. Patient is aware and if they have not received fill by intended date they must follow up with pharmacy.

## 2023-12-09 ENCOUNTER — Other Ambulatory Visit: Payer: Self-pay

## 2023-12-30 ENCOUNTER — Other Ambulatory Visit: Payer: Self-pay

## 2023-12-30 NOTE — Progress Notes (Signed)
 Specialty Pharmacy Refill Coordination Note  Samantha Yates is a 63 y.o. female contacted today regarding refills of specialty medication(s) Abatacept (Orencia ClickJect)   Patient requested Delivery   Delivery date: 01/10/24   Verified address: [address] [place], [place] [address]   Medication will be filled on 01/09/24.

## 2023-12-31 NOTE — Progress Notes (Deleted)
 Office Visit Note  Patient: Samantha Yates             Date of Birth: 1961/03/16           MRN: [id number]             PCP: Minus Amel, MD Referring: Minus Amel, MD Visit Date: 01/14/2024 Occupation: @GUAROCC @  Subjective:  No chief complaint on file.   History of Present Illness: NANETTA WIEGMAN is a 63 y.o. female ***     Activities of Daily Living:  Patient reports morning stiffness for *** {minute/hour:19697}.   Patient {ACTIONS;DENIES/REPORTS:21021675::"Denies"} nocturnal pain.  Difficulty dressing/grooming: {ACTIONS;DENIES/REPORTS:21021675::"Denies"} Difficulty climbing stairs: {ACTIONS;DENIES/REPORTS:21021675::"Denies"} Difficulty getting out of chair: {ACTIONS;DENIES/REPORTS:21021675::"Denies"} Difficulty using hands for taps, buttons, cutlery, and/or writing: {ACTIONS;DENIES/REPORTS:21021675::"Denies"}  No Rheumatology ROS completed.   PMFS History:  Patient Active Problem List   Diagnosis Date Noted   Special screening for malignant neoplasms, colon    High risk medication use 11/08/2016   Primary osteoarthritis of both hands 11/08/2016   Primary osteoarthritis of both feet 11/08/2016   History of gastroesophageal reflux (GERD) 11/08/2016   History of hypertension 11/08/2016   History of anxiety 11/08/2016   History of allergic rhinitis 11/08/2016   Former smoker 11/08/2016   GERD (gastroesophageal reflux disease) 09/17/2008   Rheumatoid arthritis (HCC) 09/17/2006   Anxiety 09/17/2006   Hypertension 09/17/2005   Allergic rhinitis 09/17/1996    Past Medical History:  Diagnosis Date   Arthritis    Hypertension    Rheumatoid arthritis (HCC)     Family History  Problem Relation Age of Onset   Colon polyps Mother    Stroke Mother    Diabetes Brother    Diabetes Brother    Hypertension Daughter    Diabetes Daughter    Healthy Son    Colon cancer Cousin    Past Surgical History:  Procedure Laterality Date   COLONOSCOPY N/A 04/09/2018    Procedure: COLONOSCOPY;  Surgeon: Alyce Jubilee, MD;  [facility];  Service: Endoscopy;  Laterality: N/A;  9:30   POLYPECTOMY  04/09/2018   Procedure: POLYPECTOMY;  Surgeon: Alyce Jubilee, MD;  [facility];  Service: Endoscopy;;  ascending, descending colon,rectal, transverse,   TONSILLECTOMY     Social History   Social History Narrative   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   Immunization History  Administered Date(s) Administered   Moderna SARS-COV2 Booster Vaccination 04/14/2021   Moderna Sars-Covid-2 Vaccination 12/04/2019, 01/05/2020, 05/12/2020   Tdap 04/03/2015     Objective: Vital Signs: There were no vitals taken for this visit.   Physical Exam   Musculoskeletal Exam: ***  CDAI Exam: CDAI Score: -- Patient Global: --; Provider Global: -- Swollen: --; Tender: -- Joint Exam 01/14/2024   No joint exam has been documented for this visit   There is currently no information documented on the homunculus. Go to the Rheumatology activity and complete the homunculus joint exam.  Investigation: No additional findings.  Imaging: No results found.  Recent Labs: Lab Results  Component Value Date   WBC 7.9 09/20/2023   HGB 12.8 09/20/2023   PLT 233 09/20/2023   NA 136 09/20/2023   K 4.8 09/20/2023   CL 100 09/20/2023   CO2 25 09/20/2023   GLUCOSE 93 09/20/2023   BUN 16 09/20/2023   CREATININE 0.91 09/20/2023   BILITOT 0.3 09/20/2023   ALKPHOS 76 09/20/2023   AST 15 09/20/2023   ALT 12 09/20/2023   PROT 6.5  09/20/2023   ALBUMIN 4.3 09/20/2023   CALCIUM 9.7 09/20/2023   GFRAA 81 07/28/2020   QFTBGOLDPLUS Negative 01/22/2023    Speciality Comments: No specialty comments available.  Procedures:  No procedures performed Allergies: Sulfamethoxazole   Assessment / Plan:     Visit Diagnoses: Rheumatoid arthritis involving multiple sites with positive rheumatoid factor (HCC)  High risk medication use  Thrombocytopenia  (HCC)  Primary osteoarthritis of both hands  Primary osteoarthritis of both feet  Chronic pain of left knee  Trochanteric bursitis of both hips  Other fatigue  History of anxiety  History of hypertension  History of allergic rhinitis  History of gastroesophageal reflux (GERD)  Former smoker  Orders: No orders of the defined types were placed in this encounter.  No orders of the defined types were placed in this encounter.   Face-to-face time spent with patient was *** minutes. Greater than 50% of time was spent in counseling and coordination of care.  Follow-Up Instructions: No follow-ups on file.   Romayne Clubs, PA-C  Note - This record has been created using Dragon software.  Chart creation errors have been sought, but may not always  have been located. Such creation errors do not reflect on  the standard of medical care.

## 2024-01-09 ENCOUNTER — Other Ambulatory Visit: Payer: Self-pay

## 2024-01-14 ENCOUNTER — Ambulatory Visit: Admitting: Physician Assistant

## 2024-01-14 DIAGNOSIS — Z8679 Personal history of other diseases of the circulatory system: Secondary | ICD-10-CM

## 2024-01-14 DIAGNOSIS — M7061 Trochanteric bursitis, right hip: Secondary | ICD-10-CM

## 2024-01-14 DIAGNOSIS — Z8719 Personal history of other diseases of the digestive system: Secondary | ICD-10-CM

## 2024-01-14 DIAGNOSIS — G8929 Other chronic pain: Secondary | ICD-10-CM

## 2024-01-14 DIAGNOSIS — M19041 Primary osteoarthritis, right hand: Secondary | ICD-10-CM

## 2024-01-14 DIAGNOSIS — Z87891 Personal history of nicotine dependence: Secondary | ICD-10-CM

## 2024-01-14 DIAGNOSIS — Z79899 Other long term (current) drug therapy: Secondary | ICD-10-CM

## 2024-01-14 DIAGNOSIS — D696 Thrombocytopenia, unspecified: Secondary | ICD-10-CM

## 2024-01-14 DIAGNOSIS — R5383 Other fatigue: Secondary | ICD-10-CM

## 2024-01-14 DIAGNOSIS — M19071 Primary osteoarthritis, right ankle and foot: Secondary | ICD-10-CM

## 2024-01-14 DIAGNOSIS — M0579 Rheumatoid arthritis with rheumatoid factor of multiple sites without organ or systems involvement: Secondary | ICD-10-CM

## 2024-01-14 DIAGNOSIS — Z8709 Personal history of other diseases of the respiratory system: Secondary | ICD-10-CM

## 2024-01-14 DIAGNOSIS — Z8659 Personal history of other mental and behavioral disorders: Secondary | ICD-10-CM

## 2024-01-15 ENCOUNTER — Other Ambulatory Visit (HOSPITAL_COMMUNITY): Payer: Self-pay

## 2024-01-16 ENCOUNTER — Ambulatory Visit: Payer: Medicare Other | Admitting: Physician Assistant

## 2024-02-05 ENCOUNTER — Other Ambulatory Visit: Payer: Self-pay

## 2024-02-05 ENCOUNTER — Other Ambulatory Visit (HOSPITAL_COMMUNITY): Payer: Self-pay

## 2024-02-05 NOTE — Progress Notes (Signed)
 Specialty Pharmacy Refill Coordination Note  ANALENA GAMA is a 63 y.o. female contacted today regarding refills of specialty medication(s) No data recorded  Patient requested (Patient-Rptd) Delivery   Delivery date: (Patient-Rptd) 02/11/24   Verified address: (Patient-Rptd) [address] [place],  [address]   Medication will be filled on 02/11/24. New delivery date is 02/12/24. Patient has been notified.

## 2024-02-11 ENCOUNTER — Other Ambulatory Visit: Payer: Self-pay

## 2024-02-13 ENCOUNTER — Other Ambulatory Visit: Payer: Self-pay | Admitting: Family Medicine

## 2024-02-13 DIAGNOSIS — Z1231 Encounter for screening mammogram for malignant neoplasm of breast: Secondary | ICD-10-CM

## 2024-02-18 ENCOUNTER — Ambulatory Visit (HOSPITAL_BASED_OUTPATIENT_CLINIC_OR_DEPARTMENT_OTHER): Admit: 2024-02-18 | Admitting: Orthopedic Surgery

## 2024-02-18 ENCOUNTER — Encounter (HOSPITAL_BASED_OUTPATIENT_CLINIC_OR_DEPARTMENT_OTHER): Payer: Self-pay

## 2024-02-18 SURGERY — ARTHROSCOPY, KNEE, WITH MEDIAL MENISCECTOMY
Anesthesia: Choice | Site: Knee | Laterality: Left

## 2024-02-19 ENCOUNTER — Inpatient Hospital Stay: Admission: RE | Admit: 2024-02-19 | Source: Ambulatory Visit

## 2024-02-27 DIAGNOSIS — S83282A Other tear of lateral meniscus, current injury, left knee, initial encounter: Secondary | ICD-10-CM | POA: Diagnosis not present

## 2024-02-27 DIAGNOSIS — S83242A Other tear of medial meniscus, current injury, left knee, initial encounter: Secondary | ICD-10-CM | POA: Diagnosis not present

## 2024-02-27 DIAGNOSIS — S83232A Complex tear of medial meniscus, current injury, left knee, initial encounter: Secondary | ICD-10-CM | POA: Diagnosis not present

## 2024-02-27 HISTORY — PX: MENISCECTOMY: SHX123

## 2024-03-10 ENCOUNTER — Other Ambulatory Visit: Payer: Self-pay | Admitting: *Deleted

## 2024-03-10 DIAGNOSIS — Z79899 Other long term (current) drug therapy: Secondary | ICD-10-CM

## 2024-03-10 DIAGNOSIS — Z111 Encounter for screening for respiratory tuberculosis: Secondary | ICD-10-CM

## 2024-03-10 DIAGNOSIS — Z9225 Personal history of immunosupression therapy: Secondary | ICD-10-CM

## 2024-03-11 ENCOUNTER — Other Ambulatory Visit: Payer: Self-pay | Admitting: Rheumatology

## 2024-03-11 ENCOUNTER — Other Ambulatory Visit: Payer: Self-pay

## 2024-03-11 DIAGNOSIS — S83242D Other tear of medial meniscus, current injury, left knee, subsequent encounter: Secondary | ICD-10-CM | POA: Diagnosis not present

## 2024-03-11 DIAGNOSIS — Z111 Encounter for screening for respiratory tuberculosis: Secondary | ICD-10-CM

## 2024-03-11 DIAGNOSIS — Z79899 Other long term (current) drug therapy: Secondary | ICD-10-CM

## 2024-03-11 DIAGNOSIS — Z9225 Personal history of immunosupression therapy: Secondary | ICD-10-CM | POA: Diagnosis not present

## 2024-03-11 DIAGNOSIS — S83282D Other tear of lateral meniscus, current injury, left knee, subsequent encounter: Secondary | ICD-10-CM | POA: Diagnosis not present

## 2024-03-12 ENCOUNTER — Other Ambulatory Visit: Payer: Self-pay | Admitting: Physician Assistant

## 2024-03-12 ENCOUNTER — Encounter: Payer: Self-pay | Admitting: Rheumatology

## 2024-03-12 ENCOUNTER — Other Ambulatory Visit: Payer: Self-pay

## 2024-03-12 ENCOUNTER — Ambulatory Visit: Payer: Self-pay | Admitting: Physician Assistant

## 2024-03-12 DIAGNOSIS — M0579 Rheumatoid arthritis with rheumatoid factor of multiple sites without organ or systems involvement: Secondary | ICD-10-CM

## 2024-03-12 DIAGNOSIS — Z79899 Other long term (current) drug therapy: Secondary | ICD-10-CM

## 2024-03-12 MED ORDER — ORENCIA CLICKJECT 125 MG/ML ~~LOC~~ SOAJ
125.0000 mg | SUBCUTANEOUS | 0 refills | Status: DC
  Filled 2024-03-12: qty 12, 84d supply, fill #0
  Filled 2024-03-17: qty 4, 28d supply, fill #0
  Filled 2024-04-23: qty 4, 28d supply, fill #1
  Filled 2024-05-27: qty 4, 28d supply, fill #2

## 2024-03-12 NOTE — Telephone Encounter (Signed)
 Last Fill: 11/26/2023  Labs: 03/11/2025 CBC WNL. Glucose is 117. Rest of CMP WNL  TB Gold: updated on 03/11/2024 results pending   Next Visit: 03/30/2024  Last Visit: 08/14/2023  DX: Rheumatoid arthritis involving multiple sites with positive rheumatoid factor   Current Dose per office note 08/14/2023: Orencia  Clickject 125 mg subcutaneous injection every 7 days   Okay to refill Orencia ?

## 2024-03-14 LAB — CBC WITH DIFFERENTIAL/PLATELET
Absolute Lymphocytes: 2302 {cells}/uL (ref 850–3900)
Absolute Monocytes: 538 {cells}/uL (ref 200–950)
Basophils Absolute: 42 {cells}/uL (ref 0–200)
Basophils Relative: 0.5 %
Eosinophils Absolute: 160 {cells}/uL (ref 15–500)
Eosinophils Relative: 1.9 %
HCT: 37.8 % (ref 35.0–45.0)
Hemoglobin: 12.3 g/dL (ref 11.7–15.5)
MCH: 31.4 pg (ref 27.0–33.0)
MCHC: 32.5 g/dL (ref 32.0–36.0)
MCV: 96.4 fL (ref 80.0–100.0)
MPV: 11.8 fL (ref 7.5–12.5)
Monocytes Relative: 6.4 %
Neutro Abs: 5359 {cells}/uL (ref 1500–7800)
Neutrophils Relative %: 63.8 %
Platelets: 192 10*3/uL (ref 140–400)
RBC: 3.92 10*6/uL (ref 3.80–5.10)
RDW: 12.4 % (ref 11.0–15.0)
Total Lymphocyte: 27.4 %
WBC: 8.4 10*3/uL (ref 3.8–10.8)

## 2024-03-14 LAB — QUANTIFERON-TB GOLD PLUS
Mitogen-NIL: 8.92 [IU]/mL
NIL: 0.03 [IU]/mL
QuantiFERON-TB Gold Plus: NEGATIVE
TB1-NIL: 0.01 [IU]/mL
TB2-NIL: 0.01 [IU]/mL

## 2024-03-14 LAB — COMPREHENSIVE METABOLIC PANEL WITH GFR
AG Ratio: 1.6 (calc) (ref 1.0–2.5)
ALT: 11 U/L (ref 6–29)
AST: 15 U/L (ref 10–35)
Albumin: 3.9 g/dL (ref 3.6–5.1)
Alkaline phosphatase (APISO): 67 U/L (ref 37–153)
BUN: 14 mg/dL (ref 7–25)
CO2: 25 mmol/L (ref 20–32)
Calcium: 9 mg/dL (ref 8.6–10.4)
Chloride: 102 mmol/L (ref 98–110)
Creat: 0.96 mg/dL (ref 0.50–1.05)
Globulin: 2.5 g/dL (ref 1.9–3.7)
Glucose, Bld: 117 mg/dL — ABNORMAL HIGH (ref 65–99)
Potassium: 4.6 mmol/L (ref 3.5–5.3)
Sodium: 137 mmol/L (ref 135–146)
Total Bilirubin: 0.3 mg/dL (ref 0.2–1.2)
Total Protein: 6.4 g/dL (ref 6.1–8.1)
eGFR: 66 mL/min/{1.73_m2} (ref >=60)

## 2024-03-16 ENCOUNTER — Encounter (INDEPENDENT_AMBULATORY_CARE_PROVIDER_SITE_OTHER): Payer: Self-pay

## 2024-03-16 NOTE — Progress Notes (Unsigned)
 Office Visit Note  Patient: Samantha Yates             Date of Birth: December 26, 1960           MRN: [id number]             PCP: Marvine Rush, MD Referring: Marvine Rush, MD Visit Date: 03/30/2024 Occupation: @GUAROCC @  Subjective:  Medication monitoring  History of Present Illness: VINIA JEMMOTT is a 63 y.o. female with history of seropositive rheumatoid arthritis and osteoarthritis.  Patient remains on Orencia  Clickject 125 mg subcutaneous injection every 7 days.  Patient states that she had left knee arthroscopic surgery performed on 02/27/2024 for meniscal repair.  Patient states that she has not required physical therapy.  Patient states that she was off of Orencia  for 3 weeks around the time of surgery and has since then had some increased arthralgias and joint stiffness.  She has had some increased soreness in both hands but denies any obvious joint swelling.  Patient plans on trying meloxicam as needed for pain relief.     Activities of Daily Living:  Patient reports morning stiffness for 1-2 hours.   Patient Denies nocturnal pain.  Difficulty dressing/grooming: Reports Difficulty climbing stairs: Denies Difficulty getting out of chair: Denies Difficulty using hands for taps, buttons, cutlery, and/or writing: Reports  Review of Systems  Constitutional:  Positive for fatigue.  HENT:  Positive for mouth dryness. Negative for mouth sores.   Eyes:  Positive for dryness.  Respiratory:  Negative for shortness of breath.   Cardiovascular:  Negative for chest pain and palpitations.  Gastrointestinal:  Negative for blood in stool, constipation and diarrhea.  Endocrine: Negative for increased urination.  Genitourinary:  Negative for involuntary urination.  Musculoskeletal:  Positive for joint pain, joint pain and morning stiffness. Negative for gait problem, joint swelling, myalgias, muscle weakness, muscle tenderness and myalgias.  Skin:  Negative for color change, rash, hair  loss and sensitivity to sunlight.  Allergic/Immunologic: Negative for susceptible to infections.  Neurological:  Positive for headaches. Negative for dizziness.  Hematological:  Negative for swollen glands.  Psychiatric/Behavioral:  Positive for sleep disturbance. Negative for depressed mood. The patient is not nervous/anxious.     PMFS History:  Patient Active Problem List   Diagnosis Date Noted   Special screening for malignant neoplasms, colon    High risk medication use 11/08/2016   Primary osteoarthritis of both hands 11/08/2016   Primary osteoarthritis of both feet 11/08/2016   History of gastroesophageal reflux (GERD) 11/08/2016   History of hypertension 11/08/2016   History of anxiety 11/08/2016   History of allergic rhinitis 11/08/2016   Former smoker 11/08/2016   GERD (gastroesophageal reflux disease) 09/17/2008   Rheumatoid arthritis (HCC) 09/17/2006   Anxiety 09/17/2006   Hypertension 09/17/2005   Allergic rhinitis 09/17/1996    Past Medical History:  Diagnosis Date   Arthritis    Hypertension    Rheumatoid arthritis (HCC)     Family History  Problem Relation Age of Onset   Colon polyps Mother    Stroke Mother    Diabetes Brother    Diabetes Brother    Stroke Brother    Hypertension Daughter    Diabetes Daughter    Healthy Son    Colon cancer Cousin    Past Surgical History:  Procedure Laterality Date   COLONOSCOPY N/A 04/09/2018   Procedure: COLONOSCOPY;  Surgeon: Harvey Margo CROME, MD;  [facility];  Service: Endoscopy;  Laterality: N/A;  9:30   MENISCECTOMY Left 02/27/2024   POLYPECTOMY  04/09/2018   Procedure: POLYPECTOMY;  Surgeon: Harvey Margo CROME, MD;  [facility];  Service: Endoscopy;;  ascending, descending colon,rectal, transverse,   TONSILLECTOMY     Social History   Social History Narrative   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   Immunization History  Administered Date(s) Administered   Moderna  SARS-COV2 Booster Vaccination 04/14/2021   Moderna Sars-Covid-2 Vaccination 12/04/2019, 01/05/2020, 05/12/2020   Tdap 04/03/2015     Objective: Vital Signs: BP 104/71 (BP Location: Left Arm, Patient Position: Sitting, Cuff Size: Normal)   Pulse 76   Resp 16   Ht 5' 8 (1.727 m)   Wt 173 lb 12.8 oz (78.8 kg)   BMI 26.43 kg/m    Physical Exam Vitals and nursing note reviewed.  Constitutional:      Appearance: She is well-developed.  HENT:     Head: Normocephalic and atraumatic.  Eyes:     Conjunctiva/sclera: Conjunctivae normal.  Cardiovascular:     Rate and Rhythm: Normal rate and regular rhythm.     Heart sounds: Normal heart sounds.  Pulmonary:     Effort: Pulmonary effort is normal.     Breath sounds: Normal breath sounds.  Abdominal:     General: Bowel sounds are normal.     Palpations: Abdomen is soft.  Musculoskeletal:     Cervical back: Normal range of motion.  Lymphadenopathy:     Cervical: No cervical adenopathy.  Skin:    General: Skin is warm and dry.     Capillary Refill: Capillary refill takes less than 2 seconds.  Neurological:     Mental Status: She is alert and oriented to person, place, and time.  Psychiatric:        Behavior: Behavior normal.      Musculoskeletal Exam: C-spine, thoracic spine, lumbar spine good range of motion.  Shoulder joints, elbow joints, wrist joints, MCPs, PIPs, DIPs have good range of motion with no synovitis.  Complete fist formation bilaterally.  Hip joints have good range of motion with no groin pain.  Knee joints have good range of motion with some crepitus of the right knee.  Warmth of the left knee but no effusion noted.  Ankle joints have good range of motion no tenderness or joint swelling.  CDAI Exam: CDAI Score: -- Patient Global: --; Provider Global: -- Swollen: --; Tender: -- Joint Exam 03/30/2024   No joint exam has been documented for this visit   There is currently no information documented on the  homunculus. Go to the Rheumatology activity and complete the homunculus joint exam.  Investigation: No additional findings.  Imaging: No results found.  Recent Labs: Lab Results  Component Value Date   WBC 8.4 03/11/2024   HGB 12.3 03/11/2024   PLT 192 03/11/2024   NA 137 03/11/2024   K 4.6 03/11/2024   CL 102 03/11/2024   CO2 25 03/11/2024   GLUCOSE 117 (H) 03/11/2024   BUN 14 03/11/2024   CREATININE 0.96 03/11/2024   BILITOT 0.3 03/11/2024   ALKPHOS 76 09/20/2023   AST 15 03/11/2024   ALT 11 03/11/2024   PROT 6.4 03/11/2024   ALBUMIN 4.3 09/20/2023   CALCIUM 9.0 03/11/2024   GFRAA 81 07/28/2020   QFTBGOLDPLUS NEGATIVE 03/11/2024    Speciality Comments: No specialty comments available.  Procedures:  No procedures performed Allergies: Sulfamethoxazole   Assessment / Plan:     Visit Diagnoses: Rheumatoid arthritis involving multiple sites  with positive rheumatoid factor (HCC) - +RF,+anti-CCP with nodulosis: She has no synovitis on examination today.  She has been experiencing increased arthralgias and joint stiffness which she attributes to being off of Orencia  for 3 weeks.  She underwent left knee arthroscopic surgery for meniscal repair on 02/27/2024 which required her to hold Orencia  for 3 weeks.  According to the patient she has not required physical therapy but has noticed some increased generalized stiffness.  She has a prescription for meloxicam which she can take as needed for symptomatic relief.  She will remain on Orencia  125 mg subcutaneous injections once weekly.  She was advised to notify us  if her symptoms persist or worsen.  She will follow-up in the office in 5 months or sooner if needed.  High risk medication use - Orencia  Clickject 125 mg subcutaneous injections every 7 days. CBC and CMP updated on 03/11/24. Her next lab work will be due in September and every 3 months.  TB gold negative on 03/11/24.  Lipid panel updated 09/20/23.  Discussed the importance of  holding orencia  if she develops signs or symptoms of an infection and to resume once the infection has completely cleared.    Thrombocytopenia (HCC): Platelet count within normal limits on 03/11/2024.  Medial epicondylitis of right elbow: Not currently symptomatic.  Primary osteoarthritis of both hands: She has been experiencing increased arthralgias and joint stiffness involving both hands.  No synovitis was noted on examination today.  She was able to make a complete fist bilaterally.  She plans to take meloxicam as needed for symptomatic relief.  Primary osteoarthritis of both feet: She is not experiencing any increased discomfort in her feet at this time.  She is good range of motion of both ankle joints with no tenderness or joint swelling.  Chronic pain of left knee -Injury in May 2024-meniscal tear: She underwent a meniscal repair on 02/27/2024.  She has not required physical therapy.  Warmth of the left knee noted. No effusion noted.   Trochanteric bursitis of both hips: Not currently symptomatic.  Other medical conditions are listed as follows:  Other fatigue  History of anxiety  History of hypertension: Blood pressure was 104/71 today in the office.  History of allergic rhinitis  History of gastroesophageal reflux (GERD)  Former smoker  Orders: No orders of the defined types were placed in this encounter.  No orders of the defined types were placed in this encounter.   Follow-Up Instructions: Return in about 5 months (around 08/30/2024) for Rheumatoid arthritis, Osteoarthritis.   Waddell CHRISTELLA Craze, PA-C  Note - This record has been created using Dragon software.  Chart creation errors have been sought, but may not always  have been located. Such creation errors do not reflect on  the standard of medical care.

## 2024-03-17 ENCOUNTER — Other Ambulatory Visit: Payer: Self-pay

## 2024-03-17 NOTE — Progress Notes (Signed)
 Specialty Pharmacy Refill Coordination Note  KAYELA HUMPHRES is a 63 y.o. female contacted today regarding refills of specialty medication(s) Abatacept  (Orencia  ClickJect)   Patient requested (Patient-Rptd) Delivery   Delivery date: 03/19/24   Verified address: (Patient-Rptd) [address]  [place] [address]   Medication will be filled on 03/18/24.

## 2024-03-30 ENCOUNTER — Encounter: Payer: Self-pay | Admitting: Physician Assistant

## 2024-03-30 ENCOUNTER — Ambulatory Visit: Attending: Physician Assistant | Admitting: Physician Assistant

## 2024-03-30 VITALS — BP 104/71 | HR 76 | Resp 16 | Ht 68.0 in | Wt 173.8 lb

## 2024-03-30 DIAGNOSIS — M25562 Pain in left knee: Secondary | ICD-10-CM

## 2024-03-30 DIAGNOSIS — G8929 Other chronic pain: Secondary | ICD-10-CM

## 2024-03-30 DIAGNOSIS — M19071 Primary osteoarthritis, right ankle and foot: Secondary | ICD-10-CM | POA: Diagnosis not present

## 2024-03-30 DIAGNOSIS — Z79899 Other long term (current) drug therapy: Secondary | ICD-10-CM | POA: Diagnosis not present

## 2024-03-30 DIAGNOSIS — M0579 Rheumatoid arthritis with rheumatoid factor of multiple sites without organ or systems involvement: Secondary | ICD-10-CM | POA: Diagnosis not present

## 2024-03-30 DIAGNOSIS — Z8659 Personal history of other mental and behavioral disorders: Secondary | ICD-10-CM

## 2024-03-30 DIAGNOSIS — M19041 Primary osteoarthritis, right hand: Secondary | ICD-10-CM

## 2024-03-30 DIAGNOSIS — D696 Thrombocytopenia, unspecified: Secondary | ICD-10-CM

## 2024-03-30 DIAGNOSIS — M7062 Trochanteric bursitis, left hip: Secondary | ICD-10-CM

## 2024-03-30 DIAGNOSIS — Z8709 Personal history of other diseases of the respiratory system: Secondary | ICD-10-CM

## 2024-03-30 DIAGNOSIS — R5383 Other fatigue: Secondary | ICD-10-CM

## 2024-03-30 DIAGNOSIS — M7061 Trochanteric bursitis, right hip: Secondary | ICD-10-CM

## 2024-03-30 DIAGNOSIS — M7701 Medial epicondylitis, right elbow: Secondary | ICD-10-CM

## 2024-03-30 DIAGNOSIS — Z87891 Personal history of nicotine dependence: Secondary | ICD-10-CM

## 2024-03-30 DIAGNOSIS — Z8679 Personal history of other diseases of the circulatory system: Secondary | ICD-10-CM

## 2024-03-30 DIAGNOSIS — M19042 Primary osteoarthritis, left hand: Secondary | ICD-10-CM

## 2024-03-30 DIAGNOSIS — M19072 Primary osteoarthritis, left ankle and foot: Secondary | ICD-10-CM

## 2024-03-30 DIAGNOSIS — Z8719 Personal history of other diseases of the digestive system: Secondary | ICD-10-CM

## 2024-03-30 NOTE — Patient Instructions (Signed)
 Standing Labs We placed an order today for your standing lab work.  Please have your standing labs drawn at end of September and every 3 months   Please have your labs drawn 2 weeks prior to your appointment so that the provider can discuss your lab results at your appointment, if possible.  Please note that you may see your imaging and lab results in MyChart before we have reviewed them. We will contact you once all results are reviewed. Please allow our office up to 72 hours to thoroughly review all of the results before contacting the office for clarification of your results.  WALK-IN LAB HOURS  Monday through Thursday from 8:00 am -12:30 pm and 1:00 pm-4:30 pm and Friday from 8:00 am-12:00 pm.  Patients with office visits requiring labs will be seen before walk-in labs.  You may encounter longer than normal wait times. Please allow additional time. Wait times may be shorter on  Monday and Thursday afternoons.  We do not book appointments for walk-in labs. We appreciate your patience and understanding with our staff.   Labs are drawn by [facility]. Please bring your co-pay at the time of your lab draw.  You may receive a bill from [facility] for your lab work.  Please note if you are on Hydroxychloroquine and and an order has been placed for a Hydroxychloroquine level,  you will need to have it drawn 4 hours or more after your last dose.  If you wish to have your labs drawn at another location, please call the office 24 hours in advance so we can fax the orders.  The office is located at [address], [address], [place], [place] [address]   If you have any questions regarding directions or hours of operation,  please call [phone].   As a reminder, please drink plenty of water prior to coming for your lab work. Thanks!

## 2024-04-23 ENCOUNTER — Encounter (INDEPENDENT_AMBULATORY_CARE_PROVIDER_SITE_OTHER): Payer: Self-pay

## 2024-04-23 ENCOUNTER — Other Ambulatory Visit: Payer: Self-pay

## 2024-04-24 ENCOUNTER — Other Ambulatory Visit: Payer: Self-pay

## 2024-04-24 ENCOUNTER — Other Ambulatory Visit: Payer: Self-pay | Admitting: Pharmacy Technician

## 2024-04-24 NOTE — Progress Notes (Signed)
 Specialty Pharmacy Refill Coordination Note  Samantha Yates is a 63 y.o. female contacted today regarding refills of specialty medication(s) Abatacept  (Orencia  ClickJect)   Patient requested (Patient-Rptd) Delivery   Delivery date: 05/05/24 Verified address: (Patient-Rptd) [address] [place], [place] [address]   Medication will be filled on 05/04/24.

## 2024-05-04 ENCOUNTER — Other Ambulatory Visit: Payer: Self-pay

## 2024-05-07 ENCOUNTER — Other Ambulatory Visit (HOSPITAL_COMMUNITY): Payer: Self-pay

## 2024-05-26 ENCOUNTER — Telehealth: Payer: Self-pay | Admitting: Family Medicine

## 2024-05-26 NOTE — Telephone Encounter (Signed)
 [facility] called in stating Dr. Avelina said she would accept her as a new patient according to new patient sister that use to work there. No documentation of approval Please call [phone]

## 2024-05-26 NOTE — Telephone Encounter (Signed)
 Yes I am willing to see her as a new patient.

## 2024-05-27 ENCOUNTER — Other Ambulatory Visit: Payer: Self-pay

## 2024-05-27 NOTE — Progress Notes (Signed)
 Specialty Pharmacy Refill Coordination Note  Samantha Yates is a 63 y.o. female contacted today regarding refills of specialty medication(s) Abatacept  (Orencia  ClickJect)   Patient requested Delivery   Delivery date: 06/03/24   Verified address: [address], [place], [address]   Medication will be filled on 06/02/24.

## 2024-06-01 NOTE — Telephone Encounter (Signed)
 pt schedule appt

## 2024-06-02 ENCOUNTER — Other Ambulatory Visit: Payer: Self-pay

## 2024-06-25 ENCOUNTER — Other Ambulatory Visit: Payer: Self-pay | Admitting: Physician Assistant

## 2024-06-25 ENCOUNTER — Other Ambulatory Visit: Payer: Self-pay

## 2024-06-25 ENCOUNTER — Other Ambulatory Visit: Payer: Self-pay | Admitting: *Deleted

## 2024-06-25 DIAGNOSIS — Z79899 Other long term (current) drug therapy: Secondary | ICD-10-CM

## 2024-06-25 DIAGNOSIS — M0579 Rheumatoid arthritis with rheumatoid factor of multiple sites without organ or systems involvement: Secondary | ICD-10-CM

## 2024-06-25 MED ORDER — ORENCIA CLICKJECT 125 MG/ML ~~LOC~~ SOAJ
125.0000 mg | SUBCUTANEOUS | 0 refills | Status: DC
  Filled 2024-06-25: qty 4, 28d supply, fill #0

## 2024-06-25 NOTE — Progress Notes (Signed)
 Specialty Pharmacy Refill Coordination Note  Samantha Yates is a 63 y.o. female contacted today regarding refills of specialty medication(s) Abatacept  (Orencia  ClickJect)   Patient requested Delivery   Delivery date: 07/08/24   Verified address: [address] [place] [address]   Medication will be filled on 10.21.25.   This fill date is pending response to refill request from provider. Patient is aware and if they have not received fill by intended date they must follow up with pharmacy.

## 2024-06-25 NOTE — Telephone Encounter (Signed)
 Last Fill: 03/12/2024  Labs: 03/11/2024 CBC WNL. Glucose is 117. Rest of CMP WNL  TB Gold: 03/11/2024 Neg    Next Visit: 09/08/2024  Last Visit: 03/30/2024  DX: Rheumatoid arthritis involving multiple sites with positive rheumatoid factor   Current Dose per office note 03/30/2024: Orencia  Clickject 125 mg subcutaneous injections every 7 days.   Attempted to contact the patient and unable to leave message, voicemail not set up  Okay to refill Orencia ?

## 2024-07-01 ENCOUNTER — Ambulatory Visit: Payer: Self-pay | Admitting: Rheumatology

## 2024-07-01 LAB — CMP14+EGFR
ALT: 13 IU/L (ref 0–32)
AST: 18 IU/L (ref 0–40)
Albumin: 4 g/dL (ref 3.9–4.9)
Alkaline Phosphatase: 73 IU/L (ref 49–135)
BUN/Creatinine Ratio: 16 (ref 12–28)
BUN: 15 mg/dL (ref 8–27)
Bilirubin Total: 0.3 mg/dL (ref 0.0–1.2)
CO2: 22 mmol/L (ref 20–29)
Calcium: 9.1 mg/dL (ref 8.7–10.3)
Chloride: 104 mmol/L (ref 96–106)
Creatinine, Ser: 0.95 mg/dL (ref 0.57–1.00)
Globulin, Total: 2.1 g/dL (ref 1.5–4.5)
Glucose: 102 mg/dL — ABNORMAL HIGH (ref 70–99)
Potassium: 5 mmol/L (ref 3.5–5.2)
Sodium: 138 mmol/L (ref 134–144)
Total Protein: 6.1 g/dL (ref 6.0–8.5)
eGFR: 67 mL/min/1.73 (ref >59)

## 2024-07-01 LAB — CBC WITH DIFFERENTIAL/PLATELET
Basophils Absolute: 0.1 x10E3/uL (ref 0.0–0.2)
Basos: 1 %
EOS (ABSOLUTE): 0.2 x10E3/uL (ref 0.0–0.4)
Eos: 3 %
Hematocrit: 35.6 % (ref 34.0–46.6)
Hemoglobin: 11.8 g/dL (ref 11.1–15.9)
Immature Grans (Abs): 0 x10E3/uL (ref 0.0–0.1)
Immature Granulocytes: 0 %
Lymphocytes Absolute: 2.9 x10E3/uL (ref 0.7–3.1)
Lymphs: 39 %
MCH: 31.4 pg (ref 26.6–33.0)
MCHC: 33.1 g/dL (ref 31.5–35.7)
MCV: 95 fL (ref 79–97)
Monocytes Absolute: 0.5 x10E3/uL (ref 0.1–0.9)
Monocytes: 7 %
Neutrophils Absolute: 3.7 x10E3/uL (ref 1.4–7.0)
Neutrophils: 50 %
Platelets: 213 x10E3/uL (ref 150–450)
RBC: 3.76 x10E6/uL — ABNORMAL LOW (ref 3.77–5.28)
RDW: 12.3 % (ref 11.7–15.4)
WBC: 7.4 x10E3/uL (ref 3.4–10.8)

## 2024-07-01 NOTE — Progress Notes (Signed)
CBC and CMP are stable.  Glucose is mildly elevated, probably not a fasting sample.

## 2024-07-07 ENCOUNTER — Other Ambulatory Visit: Payer: Self-pay

## 2024-07-09 ENCOUNTER — Other Ambulatory Visit: Payer: Self-pay

## 2024-07-09 NOTE — Progress Notes (Signed)
 Specialty Pharmacy Ongoing Clinical Assessment Note  Samantha Yates is a 63 y.o. female who is being followed by the specialty pharmacy service for RxSp Rheumatoid Arthritis   Patient's specialty medication(s) reviewed today: Abatacept  (Orencia  ClickJect)   Missed doses in the last 4 weeks: 0   Patient/Caregiver did not have any additional questions or concerns.   Therapeutic benefit summary: Patient is achieving benefit   Adverse events/side effects summary: No adverse events/side effects   Patient's therapy is appropriate to: Continue    Goals Addressed             This Visit's Progress    Maintain optimal adherence to therapy       Patient is on track. Patient will maintain adherence          Follow up: 12 months  Robertlee Rogacki M Keyonna [place] Specialty Pharmacist

## 2024-07-09 NOTE — Progress Notes (Signed)
 Clinical Intervention Note  Clinical Intervention Notes: Patient states that since she had to take a few weeks off for a surgery a few months ago she has not seen the same relief as previous from Orencia . She is still experiencing benefit and her condition has improved since she restarted but she has definitely not returned to her previous level of response. Advised her to speak to her rheumatologist to discuss further.   Clinical Intervention Outcomes: Improved therapy effectiveness   Delon CHRISTELLA Brow Specialty Pharmacist

## 2024-07-24 ENCOUNTER — Ambulatory Visit: Admitting: Family Medicine

## 2024-07-28 ENCOUNTER — Other Ambulatory Visit: Payer: Self-pay

## 2024-07-28 ENCOUNTER — Other Ambulatory Visit: Payer: Self-pay | Admitting: Physician Assistant

## 2024-07-28 ENCOUNTER — Other Ambulatory Visit (HOSPITAL_COMMUNITY): Payer: Self-pay

## 2024-07-28 ENCOUNTER — Encounter (INDEPENDENT_AMBULATORY_CARE_PROVIDER_SITE_OTHER): Payer: Self-pay

## 2024-07-28 DIAGNOSIS — Z79899 Other long term (current) drug therapy: Secondary | ICD-10-CM

## 2024-07-28 DIAGNOSIS — M0579 Rheumatoid arthritis with rheumatoid factor of multiple sites without organ or systems involvement: Secondary | ICD-10-CM

## 2024-07-28 MED ORDER — ORENCIA CLICKJECT 125 MG/ML ~~LOC~~ SOAJ
125.0000 mg | SUBCUTANEOUS | 0 refills | Status: AC
  Filled 2024-07-28: qty 4, 28d supply, fill #0
  Filled 2024-08-25 – 2024-09-08 (×2): qty 4, 28d supply, fill #1
  Filled 2024-10-06: qty 4, 28d supply, fill #2

## 2024-07-28 NOTE — Telephone Encounter (Signed)
 Last Fill: 06/25/2024 (30 day supply)  Labs: 06/30/2024 CBC and CMP are stable. Glucose is mildly elevated   TB Gold: 03/11/2024 Neg   Next Visit: 09/08/2024  Last Visit: 03/30/2024  IK:Myzlfjunpi arthritis involving multiple sites with positive rheumatoid factor   Current Dose per office note 03/30/2024: Orencia  Clickject 125 mg subcutaneous injections every 7 days.   Okay to refill Orencia ?

## 2024-07-28 NOTE — Progress Notes (Signed)
 Specialty Pharmacy Refill Coordination Note  MyChart Questionnaire Submission  Samantha Yates is a 63 y.o. female contacted today regarding refills of specialty medication(s) Orencia .  Doses on hand: (Patient-Rptd) Im not sure Im out of town Patient should have 1 injection for 08/01/24.  Injection date: 08/08/24  Patient requested: (Patient-Rptd) Delivery   Delivery date: 07/30/24  Verified address: [address] [place] [address]  Medication will be filled on 07/29/24.

## 2024-07-30 ENCOUNTER — Ambulatory Visit: Admitting: Family Medicine

## 2024-07-31 ENCOUNTER — Ambulatory Visit: Admitting: Family Medicine

## 2024-08-06 ENCOUNTER — Encounter: Payer: Self-pay | Admitting: Family Medicine

## 2024-08-25 ENCOUNTER — Other Ambulatory Visit (HOSPITAL_COMMUNITY): Payer: Self-pay

## 2024-08-25 ENCOUNTER — Other Ambulatory Visit: Payer: Self-pay

## 2024-08-25 NOTE — Progress Notes (Unsigned)
 Office Visit Note  Patient: Samantha Yates             Date of Birth: 1961-04-18           MRN: [id number]             PCP: Marvine Rush, MD Referring: Marvine Rush, MD Visit Date: 09/08/2024 Occupation: Data Unavailable  Subjective:  No chief complaint on file.   History of Present Illness: Samantha Yates is a 63 y.o. female ***     Activities of Daily Living:  Patient reports morning stiffness for *** {minute/hour:19697}.   Patient {ACTIONS;DENIES/REPORTS:21021675::Denies} nocturnal pain.  Difficulty dressing/grooming: {ACTIONS;DENIES/REPORTS:21021675::Denies} Difficulty climbing stairs: {ACTIONS;DENIES/REPORTS:21021675::Denies} Difficulty getting out of chair: {ACTIONS;DENIES/REPORTS:21021675::Denies} Difficulty using hands for taps, buttons, cutlery, and/or writing: {ACTIONS;DENIES/REPORTS:21021675::Denies}  No Rheumatology ROS completed.   PMFS History:  Patient Active Problem List   Diagnosis Date Noted  . Special screening for malignant neoplasms, colon   . High risk medication use 11/08/2016  . Primary osteoarthritis of both hands 11/08/2016  . Primary osteoarthritis of both feet 11/08/2016  . History of gastroesophageal reflux (GERD) 11/08/2016  . History of hypertension 11/08/2016  . History of anxiety 11/08/2016  . History of allergic rhinitis 11/08/2016  . Former smoker 11/08/2016  . GERD (gastroesophageal reflux disease) 09/17/2008  . Rheumatoid arthritis (HCC) 09/17/2006  . Anxiety 09/17/2006  . Hypertension 09/17/2005  . Allergic rhinitis 09/17/1996    Past Medical History:  Diagnosis Date  . Arthritis   . Hypertension   . Rheumatoid arthritis (HCC)     Family History  Problem Relation Age of Onset  . Colon polyps Mother   . Stroke Mother   . Diabetes Brother   . Diabetes Brother   . Stroke Brother   . Hypertension Daughter   . Diabetes Daughter   . Healthy Son   . Colon cancer Cousin    Past Surgical History:   Procedure Laterality Date  . COLONOSCOPY N/A 04/09/2018   Procedure: COLONOSCOPY;  Surgeon: Harvey Margo CROME, MD;  Location: [facility];  Service: Endoscopy;  Laterality: N/A;  9:30  . MENISCECTOMY Left 02/27/2024  . POLYPECTOMY  04/09/2018   Procedure: POLYPECTOMY;  Surgeon: Harvey Margo CROME, MD;  Location: [facility];  Service: Endoscopy;;  ascending, descending colon,rectal, transverse,  . TONSILLECTOMY     Social History   Tobacco Use  . Smoking status: Former    Current packs/day: 0.00    Average packs/day: 0.2 packs/day for 12.0 years (2.4 ttl pk-yrs)    Types: Cigarettes    Start date: 09/17/1986    Quit date: 09/17/1998    Years since quitting: 25.9    Passive exposure: Current (minimal)  . Smokeless tobacco: Never  Vaping Use  . Vaping status: Never Used  Substance Use Topics  . Alcohol use: Not Currently  . 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     Immunization History  Administered Date(s) Administered  . Moderna SARS-COV2 Booster Vaccination 04/14/2021  . Moderna Sars-Covid-2 Vaccination 12/04/2019, 01/05/2020, 05/12/2020  . Tdap 04/03/2015     Objective: Vital Signs: There were no vitals taken for this visit.   Physical Exam   Musculoskeletal Exam: ***  CDAI Exam: CDAI Score: -- Patient Global: --; Provider Global: -- Swollen: --; Tender: -- Joint Exam 09/08/2024   No joint exam has been documented for this visit   There is currently no information documented on the  homunculus. Go to the Rheumatology activity and complete the homunculus joint exam.  Investigation: No additional findings.  Imaging: No results found.  Recent Labs: Lab Results  Component Value Date   WBC 7.4 06/30/2024   HGB 11.8 06/30/2024   PLT 213 06/30/2024   NA 138 06/30/2024   K 5.0 06/30/2024   CL 104 06/30/2024   CO2 22 06/30/2024   GLUCOSE 102 (H) 06/30/2024   BUN 15 06/30/2024    CREATININE 0.95 06/30/2024   BILITOT 0.3 06/30/2024   ALKPHOS 73 06/30/2024   AST 18 06/30/2024   ALT 13 06/30/2024   PROT 6.1 06/30/2024   ALBUMIN 4.0 06/30/2024   CALCIUM 9.1 06/30/2024   GFRAA 81 07/28/2020   QFTBGOLDPLUS NEGATIVE 03/11/2024    Speciality Comments: No specialty comments available.  Procedures:  No procedures performed Allergies: Sulfamethoxazole   Assessment / Plan:     Visit Diagnoses: No diagnosis found.  Orders: No orders of the defined types were placed in this encounter.  No orders of the defined types were placed in this encounter.   Face-to-face time spent with patient was *** minutes. Greater than 50% of time was spent in counseling and coordination of care.  Follow-Up Instructions: No follow-ups on file.   Daved JAYSON Gavel, CMA  Note - This record has been created using Animal nutritionist.  Chart creation errors have been sought, but may not always  have been located. Such creation errors do not reflect on  the standard of medical care.

## 2024-08-28 ENCOUNTER — Encounter: Payer: Self-pay | Admitting: Family Medicine

## 2024-08-28 ENCOUNTER — Ambulatory Visit: Admitting: Family Medicine

## 2024-08-28 VITALS — BP 128/76 | HR 71 | Temp 98.0°F | Ht 65.75 in | Wt 174.1 lb

## 2024-08-28 DIAGNOSIS — Z1231 Encounter for screening mammogram for malignant neoplasm of breast: Secondary | ICD-10-CM

## 2024-08-28 DIAGNOSIS — E785 Hyperlipidemia, unspecified: Secondary | ICD-10-CM | POA: Insufficient documentation

## 2024-08-28 DIAGNOSIS — J309 Allergic rhinitis, unspecified: Secondary | ICD-10-CM

## 2024-08-28 DIAGNOSIS — I10 Essential (primary) hypertension: Secondary | ICD-10-CM | POA: Diagnosis not present

## 2024-08-28 DIAGNOSIS — F419 Anxiety disorder, unspecified: Secondary | ICD-10-CM

## 2024-08-28 DIAGNOSIS — F411 Generalized anxiety disorder: Secondary | ICD-10-CM

## 2024-08-28 DIAGNOSIS — K219 Gastro-esophageal reflux disease without esophagitis: Secondary | ICD-10-CM

## 2024-08-28 DIAGNOSIS — Z87891 Personal history of nicotine dependence: Secondary | ICD-10-CM | POA: Diagnosis not present

## 2024-08-28 DIAGNOSIS — R0789 Other chest pain: Secondary | ICD-10-CM | POA: Diagnosis not present

## 2024-08-28 DIAGNOSIS — Z23 Encounter for immunization: Secondary | ICD-10-CM

## 2024-08-28 DIAGNOSIS — M0579 Rheumatoid arthritis with rheumatoid factor of multiple sites without organ or systems involvement: Secondary | ICD-10-CM | POA: Diagnosis not present

## 2024-08-28 MED ORDER — FLUTICASONE PROPIONATE 50 MCG/ACT NA SUSP: 2.0000 | NASAL | 3 refills | Status: DC | PRN

## 2024-08-28 NOTE — Assessment & Plan Note (Signed)
 Due for re-eval.

## 2024-08-28 NOTE — Assessment & Plan Note (Signed)
 Yearlong but worse in fall and spring.  Singulair not helping much, but are tolerable off it.

## 2024-08-28 NOTE — Assessment & Plan Note (Signed)
 Chronic, on omeprazole  40 mg daily.

## 2024-08-28 NOTE — Assessment & Plan Note (Addendum)
 Acute, most likely GI in origin.  EKG NSR, no ST changes, no LVH, no Q waves, no previous for comparison.  Start famotidin BID in additiona to PPI. Avoid reflux triggers.

## 2024-08-28 NOTE — Assessment & Plan Note (Signed)
 QUIT 25 years ago.

## 2024-08-28 NOTE — Progress Notes (Signed)
 Patient ID: Samantha Yates, female    DOB: 09-19-1960, 63 y.o.   MRN: [id number]  This visit was conducted in person.  BP 128/76   Pulse 71   Temp 98 F (36.7 C) (Temporal)   Ht 5' 5.75 (1.67 m)   Wt 174 lb 2 oz (79 kg)   SpO2 98%   BMI 28.32 kg/m    CC:  Chief Complaint  Patient presents with   Establish Care    Subjective:   HPI: PARA Samantha Yates is a 63 y.o. female presenting on 08/28/2024 for Establish Care  New onset chest pain on the way to office today.  Started riding in car.. no exertional component  Ate fries, few 1 hour  prior.  Central  chest, radiated to back.  No SOB, no nausea, no sweating.  Uses omeprazole for GERD... moderate control.   PCP Dr. Richelle  Last CPX 09/2022  RA on Orencia   Dr. Dolphus  Hypertension:   Well controled on enalapril 10 mg daily. BP Readings from Last 3 Encounters:  08/28/24 128/76  03/30/24 104/71  08/14/23 132/79   Using medication without problems or lightheadedness:  Chest pain with exertion: Edema: Short of breath: Average home BPs: Other issues:        Relevant past medical, surgical, family and social history reviewed and updated as indicated. Interim medical history since our last visit reviewed. Allergies and medications reviewed and updated. Outpatient Medications Prior to Visit  Medication Sig Dispense Refill   Abatacept  (ORENCIA  CLICKJECT) 125 MG/ML SOAJ Inject 125 mg into the skin once a week. 12 mL 0   acetaminophen (TYLENOL) 650 MG CR tablet Take 650 mg by mouth every 8 (eight) hours as needed for pain.     citalopram (CELEXA) 40 MG tablet Take 1 tablet (40 mg total) by mouth at bedtime.     enalapril (VASOTEC) 10 MG tablet Take 10 mg by mouth daily.     meloxicam (MOBIC) 15 MG tablet Take 15 mg by mouth daily.     omeprazole (PRILOSEC) 40 MG capsule Take 40 mg by mouth daily.     fluticasone (FLONASE) 50 MCG/ACT nasal spray Place 2 sprays into both nostrils as needed.      B Complex  Vitamins (B COMPLEX PO) Take by mouth daily. (Patient not taking: Reported on 03/30/2024)     folic acid (FOLVITE) 1 MG tablet Take 1 mg by mouth daily. (Patient not taking: Reported on 03/30/2024)     montelukast (SINGULAIR) 10 MG tablet Take 10 mg by mouth as needed. (Patient not taking: Reported on 03/30/2024)     predniSONE  (DELTASONE ) 5 MG tablet Take 4 tabs po x 4 days, 3  tabs po x 4 days, 2  tabs po x 4 days, 1  tab po x 4 days (Patient not taking: Reported on 03/30/2024) 40 tablet 0   No facility-administered medications prior to visit.     Per HPI unless specifically indicated in ROS section below Review of Systems  Constitutional:  Negative for fatigue and fever.  HENT:  Negative for congestion.   Eyes:  Negative for pain.  Respiratory:  Negative for cough and shortness of breath.   Cardiovascular:  Negative for chest pain, palpitations and leg swelling.  Gastrointestinal:  Negative for abdominal pain.  Genitourinary:  Negative for dysuria and vaginal bleeding.  Musculoskeletal:  Negative for back pain.  Neurological:  Negative for syncope, light-headedness and headaches.  Psychiatric/Behavioral:  Negative for dysphoric mood.  Objective:  BP 128/76   Pulse 71   Temp 98 F (36.7 C) (Temporal)   Ht 5' 5.75 (1.67 m)   Wt 174 lb 2 oz (79 kg)   SpO2 98%   BMI 28.32 kg/m   Wt Readings from Last 3 Encounters:  08/28/24 174 lb 2 oz (79 kg)  03/30/24 173 lb 12.8 oz (78.8 kg)  08/14/23 184 lb 9.6 oz (83.7 kg)      Physical Exam Constitutional:      General: She is not in acute distress.    Appearance: Normal appearance. She is well-developed. She is not ill-appearing or toxic-appearing.  HENT:     Head: Normocephalic.     Right Ear: Hearing, tympanic membrane, ear canal and external ear normal. Tympanic membrane is not erythematous, retracted or bulging.     Left Ear: Hearing, tympanic membrane, ear canal and external ear normal. Tympanic membrane is not erythematous,  retracted or bulging.     Nose: No mucosal edema or rhinorrhea.     Right Sinus: No maxillary sinus tenderness or frontal sinus tenderness.     Left Sinus: No maxillary sinus tenderness or frontal sinus tenderness.     Mouth/Throat:     Pharynx: Uvula midline.  Eyes:     General: Lids are normal. Lids are everted, no foreign bodies appreciated.     Conjunctiva/sclera: Conjunctivae normal.     Pupils: Pupils are equal, round, and reactive to light.  Neck:     Thyroid : No thyroid  mass or thyromegaly.     Vascular: No carotid bruit.     Trachea: Trachea normal.  Cardiovascular:     Rate and Rhythm: Normal rate and regular rhythm.     Pulses: Normal pulses.     Heart sounds: Normal heart sounds, S1 normal and S2 normal. No murmur heard.    No friction rub. No gallop.  Pulmonary:     Effort: Pulmonary effort is normal. No tachypnea or respiratory distress.     Breath sounds: Normal breath sounds. No decreased breath sounds, wheezing, rhonchi or rales.  Abdominal:     General: Bowel sounds are normal.     Palpations: Abdomen is soft.     Tenderness: There is no abdominal tenderness.  Musculoskeletal:     Cervical back: Normal range of motion and neck supple.  Skin:    General: Skin is warm and dry.     Findings: No rash.  Neurological:     Mental Status: She is alert.  Psychiatric:        Mood and Affect: Mood is not anxious or depressed.        Speech: Speech normal.        Behavior: Behavior normal. Behavior is cooperative.        Thought Content: Thought content normal.        Judgment: Judgment normal.       Results for orders placed or performed in visit on 06/25/24  CBC with Differential/Platelet   Collection Time: 06/30/24  9:31 AM  Result Value Ref Range   WBC 7.4 3.4 - 10.8 x10E3/uL   RBC 3.76 (L) 3.77 - 5.28 x10E6/uL   Hemoglobin 11.8 11.1 - 15.9 g/dL   Hematocrit 64.3 65.9 - 46.6 %   MCV 95 79 - 97 fL   MCH 31.4 26.6 - 33.0 pg   MCHC 33.1 31.5 - 35.7 g/dL    RDW 87.6 88.2 - 84.5 %   Platelets 213 150 - 450 x10E3/uL  Neutrophils 50 Not Estab. %   Lymphs 39 Not Estab. %   Monocytes 7 Not Estab. %   Eos 3 Not Estab. %   Basos 1 Not Estab. %   Neutrophils Absolute 3.7 1.4 - 7.0 x10E3/uL   Lymphocytes Absolute 2.9 0.7 - 3.1 x10E3/uL   Monocytes Absolute 0.5 0.1 - 0.9 x10E3/uL   EOS (ABSOLUTE) 0.2 0.0 - 0.4 x10E3/uL   Basophils Absolute 0.1 0.0 - 0.2 x10E3/uL   Immature Granulocytes 0 Not Estab. %   Immature Grans (Abs) 0.0 0.0 - 0.1 x10E3/uL  CMP14+EGFR   Collection Time: 06/30/24  9:31 AM  Result Value Ref Range   Glucose 102 (H) 70 - 99 mg/dL   BUN 15 8 - 27 mg/dL   Creatinine, Ser 9.04 0.57 - 1.00 mg/dL   eGFR 67 >40 fO/fpw/8.26   BUN/Creatinine Ratio 16 12 - 28   Sodium 138 134 - 144 mmol/L   Potassium 5.0 3.5 - 5.2 mmol/L   Chloride 104 96 - 106 mmol/L   CO2 22 20 - 29 mmol/L   Calcium 9.1 8.7 - 10.3 mg/dL   Total Protein 6.1 6.0 - 8.5 g/dL   Albumin 4.0 3.9 - 4.9 g/dL   Globulin, Total 2.1 1.5 - 4.5 g/dL   Bilirubin Total 0.3 0.0 - 1.2 mg/dL   Alkaline Phosphatase 73 49 - 135 IU/L   AST 18 0 - 40 IU/L   ALT 13 0 - 32 IU/L    Assessment and Plan  Atypical chest pain Assessment & Plan:  Acute, most likely GI in origin.  EKG NSR, no ST changes, no LVH, no Q waves, no previous for comparison.  Start famotidin BID in additiona to PPI. Avoid reflux triggers.  Orders: -     EKG 12-Lead  Hyperlipidemia, unspecified hyperlipidemia type Assessment & Plan:  Due for re-eval.  Orders: -     Lipid panel; Future  Rheumatoid arthritis involving multiple sites with positive rheumatoid factor (HCC) Assessment & Plan:  Well controlled  overall on Orencia .  Worse since meniscal tear in left knee.. S/P meniscectomy 02/29/2024 Dr. Josefina Using meloxicam off and on.  Has upcoming OV with rheumatology.   Gastroesophageal reflux disease, unspecified whether esophagitis present Assessment & Plan:  Chronic, on omeprazole  40 mg  daily.   Anxiety Assessment & Plan: Stable, chronic.  Continue current medication.   Citalopram 40 mg daily   Primary hypertension Assessment & Plan: Stable, chronic.  Continue current medication.  Well controled on enalapril 10 mg daily.   Allergic rhinitis, unspecified seasonality, unspecified trigger Assessment & Plan: Yearlong but worse in fall and spring.  Singulair not helping much, but are tolerable off it.   GAD (generalized anxiety disorder) Assessment & Plan: Stable, chronic.  Continue current medication.   Citalopram 40 mg daily   Former smoker Assessment & Plan:  QUIT 25 years ago.   Need for influenza vaccination -     Flu vaccine HIGH DOSE PF(Fluzone Trivalent)  Need for vaccination against Streptococcus pneumoniae -     Pneumococcal conjugate vaccine 20-valent  Screening mammogram for breast cancer -     3D Screening Mammogram, Left and Right; Future  Other orders -     Fluticasone Propionate; Place 2 sprays into both nostrils as needed.  Dispense: 48 g; Refill: 3    Return in about 6 weeks (around 10/09/2024) for phone AMW,  CPE with me.   Greig Ring, MD

## 2024-08-28 NOTE — Assessment & Plan Note (Signed)
 Stable, chronic.  Continue current medication.  Well controled on enalapril 10 mg daily.

## 2024-08-28 NOTE — Assessment & Plan Note (Signed)
Stable, chronic.  Continue current medication.   Citalopram 40 mg daily. 

## 2024-08-28 NOTE — Assessment & Plan Note (Signed)
 Well controlled  overall on Orencia .  Worse since meniscal tear in left knee.. S/P meniscectomy 02/29/2024 Dr. Josefina Using meloxicam off and on.  Has upcoming OV with rheumatology.

## 2024-08-31 MED ORDER — FLUTICASONE PROPIONATE 50 MCG/ACT NA SUSP: 2.0000 | Freq: Every day | NASAL | 3 refills | Status: AC | PRN

## 2024-08-31 NOTE — Addendum Note (Signed)
 Addended by: WENDELL ARLAND RAMAN on: 08/31/2024 12:20 PM   Modules accepted: Orders

## 2024-09-08 ENCOUNTER — Ambulatory Visit: Attending: Rheumatology | Admitting: Rheumatology

## 2024-09-08 ENCOUNTER — Encounter: Payer: Self-pay | Admitting: Rheumatology

## 2024-09-08 ENCOUNTER — Other Ambulatory Visit: Payer: Self-pay

## 2024-09-08 VITALS — BP 112/76 | HR 69 | Temp 97.4°F | Resp 12 | Ht 67.5 in | Wt 172.8 lb

## 2024-09-08 DIAGNOSIS — Z8709 Personal history of other diseases of the respiratory system: Secondary | ICD-10-CM

## 2024-09-08 DIAGNOSIS — M170 Bilateral primary osteoarthritis of knee: Secondary | ICD-10-CM | POA: Diagnosis not present

## 2024-09-08 DIAGNOSIS — G8929 Other chronic pain: Secondary | ICD-10-CM

## 2024-09-08 DIAGNOSIS — R5383 Other fatigue: Secondary | ICD-10-CM | POA: Diagnosis not present

## 2024-09-08 DIAGNOSIS — M7061 Trochanteric bursitis, right hip: Secondary | ICD-10-CM

## 2024-09-08 DIAGNOSIS — Z8679 Personal history of other diseases of the circulatory system: Secondary | ICD-10-CM | POA: Diagnosis not present

## 2024-09-08 DIAGNOSIS — Z8719 Personal history of other diseases of the digestive system: Secondary | ICD-10-CM | POA: Diagnosis not present

## 2024-09-08 DIAGNOSIS — M19071 Primary osteoarthritis, right ankle and foot: Secondary | ICD-10-CM

## 2024-09-08 DIAGNOSIS — Z79899 Other long term (current) drug therapy: Secondary | ICD-10-CM

## 2024-09-08 DIAGNOSIS — M25562 Pain in left knee: Secondary | ICD-10-CM | POA: Diagnosis not present

## 2024-09-08 DIAGNOSIS — D696 Thrombocytopenia, unspecified: Secondary | ICD-10-CM

## 2024-09-08 DIAGNOSIS — M19041 Primary osteoarthritis, right hand: Secondary | ICD-10-CM | POA: Diagnosis not present

## 2024-09-08 DIAGNOSIS — M19072 Primary osteoarthritis, left ankle and foot: Secondary | ICD-10-CM

## 2024-09-08 DIAGNOSIS — Z8659 Personal history of other mental and behavioral disorders: Secondary | ICD-10-CM | POA: Diagnosis not present

## 2024-09-08 DIAGNOSIS — M19042 Primary osteoarthritis, left hand: Secondary | ICD-10-CM

## 2024-09-08 DIAGNOSIS — M7062 Trochanteric bursitis, left hip: Secondary | ICD-10-CM

## 2024-09-08 DIAGNOSIS — M7701 Medial epicondylitis, right elbow: Secondary | ICD-10-CM

## 2024-09-08 DIAGNOSIS — M0579 Rheumatoid arthritis with rheumatoid factor of multiple sites without organ or systems involvement: Secondary | ICD-10-CM | POA: Diagnosis not present

## 2024-09-08 DIAGNOSIS — Z87891 Personal history of nicotine dependence: Secondary | ICD-10-CM | POA: Diagnosis not present

## 2024-09-08 MED ORDER — TRIAMCINOLONE ACETONIDE 40 MG/ML IJ SUSP
40.0000 mg | INTRAMUSCULAR | Status: AC | PRN
  Administered 2024-09-08: 40 mg via INTRA_ARTICULAR

## 2024-09-08 MED ORDER — LIDOCAINE HCL 1 % IJ SOLN
1.0000 mL | INTRAMUSCULAR | Status: AC | PRN
  Administered 2024-09-08: 1 mL

## 2024-09-08 NOTE — Progress Notes (Signed)
 Specialty Pharmacy Refill Coordination Note  Samantha Yates is a 63 y.o. female contacted today regarding refills of specialty medication(s) Abatacept  (Orencia  [employer])   Patient requested Delivery   Delivery date: 09/15/24   Verified address: [address] [place] [address]   Medication will be filled on: 09/14/24

## 2024-09-08 NOTE — Patient Instructions (Signed)
 Standing Labs We placed an order today for your standing lab work.   Please have your standing labs drawn in January and every 3 months  Please have your labs drawn 2 weeks prior to your appointment so that the provider can discuss your lab results at your appointment, if possible.  Please note that you may see your imaging and lab results in MyChart before we have reviewed them. We will contact you once all results are reviewed. Please allow our office up to 72 hours to thoroughly review all of the results before contacting the office for clarification of your results.  WALK-IN LAB HOURS  Monday through Thursday from 8:00 am - 4:30 pm and Friday from 8:00 am-12:00 pm.  Patients with office visits requiring labs will be seen before walk-in labs.  You may encounter longer than normal wait times. Please allow additional time. Wait times may be shorter on  Monday and Thursday afternoons.  We do not book appointments for walk-in labs. We appreciate your patience and understanding with our staff.   Labs are drawn by [facility]. Please bring your co-pay at the time of your lab draw.  You may receive a bill from [facility] for your lab work.  Please note if you are on Hydroxychloroquine and and an order has been placed for a Hydroxychloroquine level,  you will need to have it drawn 4 hours or more after your last dose.  If you wish to have your labs drawn at another location, please call the office 24 hours in advance so we can fax the orders.  The office is located at [address], [place], [place] [address]   If you have any questions regarding directions or hours of operation,  please call [phone].   As a reminder, please drink plenty of water  prior to coming for your lab work. Thanks!   Vaccines You are taking a medication(s) that can suppress your immune system.  The following immunizations are recommended: Flu annually Covid-19  RSV Td/Tdap (tetanus, diphtheria, pertussis)  every 10 years Pneumonia (Prevnar 15 then Pneumovax 23 at least 1 year apart.  Alternatively, can take Prevnar 20 without needing additional dose) Shingrix: 2 doses from 4 weeks to 6 months apart  Please check with your PCP to make sure you are up to date.   If you have signs or symptoms of an infection or start antibiotics: First, call your PCP for workup of your infection. Hold your medication through the infection, until you complete your antibiotics, and until symptoms resolve if you take the following: Injectable medication (Actemra, Benlysta, Cimzia, Cosentyx, Enbrel, Humira, Kevzara, Orencia , Remicade, Simponi, Stelara, Taltz, Tremfya) Methotrexate  Leflunomide (Arava) Mycophenolate (Cellcept) Earma, Olumiant, or Rinvoq  Please get an annual skin examination to screen for skin cancer while you are on Orencia .  Please use sunscreen and sun protection.

## 2024-09-08 NOTE — Progress Notes (Signed)
 "  Office Visit Note  Patient: Samantha Yates             Date of Birth: [birth year]           MRN: [id number]             PCP: Avelina Greig BRAVO, MD Referring: Marvine Rush, MD Visit Date: 09/08/2024 Occupation: Data Unavailable  Subjective:  Medication management  History of Present Illness: Samantha Yates is a 63 y.o. female with seropositive rheumatoid arthritis and osteoarthritis.  She returns today after her last visit on March 30, 2024.  She had left meniscus tear repair in June 2025.  She was off Orencia  for about 3 weeks.  She had been taking Orencia  since that on a regular basis but she had a flare.  She has been taking meloxicam 15 mg p.o. daily.  She states she cannot come off meloxicam because she starts having increased discomfort when she comes off meloxicam.  She had discomfort in her right shoulder and both knees recently.    Activities of Daily Living:  Patient reports morning stiffness for 20-30 minutes.   Patient Reports nocturnal pain.  Difficulty dressing/grooming: Reports Difficulty climbing stairs: Denies Difficulty getting out of chair: Denies Difficulty using hands for taps, buttons, cutlery, and/or writing: Denies  Review of Systems  Constitutional:  Positive for fatigue.  HENT:  Positive for mouth sores and mouth dryness.   Eyes:  Negative for dryness.  Respiratory:  Negative for shortness of breath.   Cardiovascular:  Negative for chest pain and palpitations.  Gastrointestinal:  Negative for blood in stool, constipation and diarrhea.  Endocrine: Negative for increased urination.  Genitourinary:  Negative for involuntary urination.  Musculoskeletal:  Positive for joint pain, joint pain, joint swelling and morning stiffness. Negative for gait problem, myalgias, muscle weakness, muscle tenderness and myalgias.  Skin:  Negative for color change, rash, hair loss and sensitivity to sunlight.  Allergic/Immunologic: Negative for susceptible to infections.   Neurological:  Positive for headaches. Negative for dizziness.  Hematological:  Negative for swollen glands.  Psychiatric/Behavioral:  Negative for depressed mood and sleep disturbance. The patient is not nervous/anxious.     PMFS History:  Patient Active Problem List   Diagnosis Date Noted   Hyperlipidemia 08/28/2024   Atypical chest pain 08/28/2024   High risk medication use 11/08/2016   Primary osteoarthritis of both hands 11/08/2016   Primary osteoarthritis of both feet 11/08/2016   Former smoker 11/08/2016   GERD (gastroesophageal reflux disease) 09/17/2008   Rheumatoid arthritis (HCC) 09/17/2006   GAD (generalized anxiety disorder) 09/17/2006   Hypertension 09/17/2005   Allergic rhinitis 09/17/1996    Past Medical History:  Diagnosis Date   Arthritis    Hypertension    Rheumatoid arthritis (HCC)     Family History  Problem Relation Age of Onset   Colon polyps Mother    Stroke Mother    Diabetes Brother    Diabetes Brother    Stroke Brother    Hypertension Daughter    Diabetes Daughter    Healthy Son    Colon cancer Cousin    Past Surgical History:  Procedure Laterality Date   COLONOSCOPY N/A 04/09/2018   Procedure: COLONOSCOPY;  Surgeon: Harvey Margo CROME, MD;  Location: [facility];  Service: Endoscopy;  Laterality: N/A;  9:30   MENISCECTOMY Left 02/27/2024   POLYPECTOMY  04/09/2018   Procedure: POLYPECTOMY;  Surgeon: Harvey Margo CROME, MD;  Location: [facility];  Service: Endoscopy;;  ascending, descending colon,rectal, transverse,   TONSILLECTOMY     Social History[1] Social History   Social History Narrative   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    Lives with daughter powell Counter,    Son   Oneil Lives in [place]   Watch greatgrandchildren     Immunization History  Administered Date(s) Administered   Fluzone Influenza virus vaccine,trivalent (IIV3), split virus 06/10/2013, 07/05/2015, 07/17/2016   INFLUENZA, HIGH DOSE SEASONAL  PF 08/28/2024   Influenza Inj Mdck Quad Pf 07/05/2017, 07/01/2018, 08/23/2022   Influenza, Quadrivalent, Recombinant, Inj, Pf 06/19/2019   Influenza,inj,Quad PF,6+ Mos 07/14/2021   Moderna SARS-COV2 Booster Vaccination 04/14/2021   Moderna Sars-Covid-2 Vaccination 12/04/2019, 01/05/2020, 05/12/2020   PNEUMOCOCCAL CONJUGATE-20 08/28/2024   Tdap 04/03/2015     Objective: Vital Signs: BP 112/76   Pulse 69   Temp (!) 97.4 F (36.3 C)   Resp 12   Ht 5' 7.5 (1.715 m)   Wt 172 lb 12.8 oz (78.4 kg)   BMI 26.66 kg/m    Physical Exam Vitals and nursing note reviewed.  Constitutional:      Appearance: She is well-developed.  HENT:     Head: Normocephalic and atraumatic.  Eyes:     Conjunctiva/sclera: Conjunctivae normal.  Cardiovascular:     Rate and Rhythm: Normal rate and regular rhythm.     Heart sounds: Normal heart sounds.  Pulmonary:     Effort: Pulmonary effort is normal.     Breath sounds: Normal breath sounds.  Abdominal:     General: Bowel sounds are normal.     Palpations: Abdomen is soft.  Musculoskeletal:     Cervical back: Normal range of motion.  Lymphadenopathy:     Cervical: No cervical adenopathy.  Skin:    General: Skin is warm and dry.     Capillary Refill: Capillary refill takes less than 2 seconds.  Neurological:     Mental Status: She is alert and oriented to person, place, and time.  Psychiatric:        Behavior: Behavior normal.      Musculoskeletal Exam: She had good range of motion of the cervical, thoracic and lumbar spine without discomfort.  Shoulders, elbows, wrist joints with good range of motion.  She had bilateral PIP and DIP thickening.  Bilateral 2nd and 3rd MCP thickening with no synovitis was noted.  Hip joints and knee joints in good range of motion.  She had warmth on palpation of her right knee joint.  Left knee joint was in full range of motion with discomfort.  There was no tenderness over ankles or MTPs.  CDAI Exam: CDAI Score:  7  Patient Global: 20 / 100; Provider Global: 20 / 100 Swollen: 1 ; Tender: 2  Joint Exam 09/08/2024      Right  Left  Knee  Swollen Tender   Tender     Investigation: No additional findings.  Imaging: No results found.  Recent Labs: Lab Results  Component Value Date   WBC 7.4 06/30/2024   HGB 11.8 06/30/2024   PLT 213 06/30/2024   NA 138 06/30/2024   K 5.0 06/30/2024   CL 104 06/30/2024   CO2 22 06/30/2024   GLUCOSE 102 (H) 06/30/2024   BUN 15 06/30/2024   CREATININE 0.95 06/30/2024   BILITOT 0.3 06/30/2024   ALKPHOS 73 06/30/2024   AST 18 06/30/2024   ALT 13 06/30/2024   PROT 6.1 06/30/2024   ALBUMIN 4.0 06/30/2024   CALCIUM 9.1 06/30/2024  GFRAA 81 07/28/2020   QFTBGOLDPLUS NEGATIVE 03/11/2024    Speciality Comments: No specialty comments available.  Procedures:  Large Joint Inj: bilateral knee on 09/08/2024 3:33 PM Indications: pain Details: 27 G 1.5 in needle, medial approach  Arthrogram: No  Medications (Right): 1 mL lidocaine  1 %; 40 mg triamcinolone  acetonide 40 MG/ML Medications (Left): 1 mL lidocaine  1 %; 40 mg triamcinolone  acetonide 40 MG/ML Outcome: tolerated well, no immediate complications  Risk of infection, tendon injury, nerve injury, hypopigmentation and dermal atrophy were discussed. Procedure, treatment alternatives, risks and benefits explained, specific risks discussed. Consent was given by the patient. Immediately prior to procedure a time out was called to verify the correct patient, procedure, equipment, support staff and site/side marked as required. Patient was prepped and draped in the usual sterile fashion.     Allergies: Sulfamethoxazole   Assessment / Plan:     Visit Diagnoses: Rheumatoid arthritis involving multiple sites with positive rheumatoid factor (HCC) - +RF,+anti-CCP with nodulosis: Patient states she started having more frequent flares since she was off Orencia  for about 3 weeks in June.  She has been unable to  come off meloxicam because of ongoing discomfort in her knee joints and sometimes intermittent discomfort in her right shoulder.  She had warmth on palpation of her right knee joint and discomfort in her left knee joint with range of motion.  No synovitis was noted on the examination.  Synovial thickening was noted on some of the joints as described above.  She has been taking Orencia  125 mg subcu every week without interruption since the end of June.  High risk medication use-she is on Orencia  125 mg subcu weekly.  June 30, 2024 CBC and CMP were normal.  TB Gold was negative on March 11, 2024.  She was advised to get labs in January and every 3 months to monitor for drug toxicity.  Information reimmunization was placed in the AVS.  She was advised to hold Orencia  if she develops an infection resume after the infection resolves.  Annual skin examination to screen for skin cancer was advised.  Use of sunscreen and sun protection was advised.  Primary osteoarthritis of both hands-she had bilateral PIP and DIP thickening.  She had MCP thickening with no synovitis.  Trochanteric bursitis of both hips-she continues to have some trochanteric discomfort.  IT band stretches were discussed.  Primary osteoarthritis of both knees-she has been having increased pain and discomfort in her knee joints.  Some warmth was noted in her right knee joint.  Patient has been taking meloxicam due to knee joint discomfort.  She requested cortisone injection.  After informed consent was obtained and side effects were discussed bilateral knee joints were injected with lidocaine  and Kenalog  as described above.  Patient tolerated the procedure well.  Postprocedure instructions were given.  Chronic pain of left knee - Injury in May 2024-meniscal tear: She underwent a meniscal repair on 02/27/2024.  I discouraged the use of long-term meloxicam.  She also has some GI side effects to meloxicam.  Primary osteoarthritis of both feet-she  denies any discomfort today.  History of hypertension-blood pressure was normal at 112/76.  She was advised to monitor blood pressure closely after cortisone injection.  History of gastroesophageal reflux (GERD)-I discouraged the use of meloxicam.  She is on Prilosec.  History of allergic rhinitis  Other fatigue  History of anxiety  Former smoker  Orders: Orders Placed This Encounter  Procedures   Large Joint Inj: bilateral knee  No orders of the defined types were placed in this encounter.    Follow-Up Instructions: Return in about 5 months (around 02/06/2025) for Osteoarthritis, Rheumatoid arthritis.   Maya Nash, MD  Note - This record has been created using Animal nutritionist.  Chart creation errors have been sought, but may not always  have been located. Such creation errors do not reflect on  the standard of medical care.     [1]  Social History Tobacco Use   Smoking status: Former    Current packs/day: 0.00    Average packs/day: 0.2 packs/day for 12.0 years (2.4 ttl pk-yrs)    Types: Cigarettes    Start date: 09/17/1986    Quit date: 09/17/1998    Years since quitting: 25.9    Passive exposure: Current (minimal)   Smokeless tobacco: Never  Vaping Use   Vaping status: Never Used  Substance Use Topics   Alcohol use: Not Currently   Drug use: No   "

## 2024-09-14 ENCOUNTER — Other Ambulatory Visit: Payer: Self-pay

## 2024-09-21 ENCOUNTER — Other Ambulatory Visit (HOSPITAL_COMMUNITY): Payer: Self-pay

## 2024-10-06 ENCOUNTER — Other Ambulatory Visit: Payer: Self-pay

## 2024-10-06 ENCOUNTER — Other Ambulatory Visit: Payer: Self-pay | Admitting: *Deleted

## 2024-10-06 DIAGNOSIS — R5383 Other fatigue: Secondary | ICD-10-CM

## 2024-10-06 DIAGNOSIS — Z79899 Other long term (current) drug therapy: Secondary | ICD-10-CM

## 2024-10-06 DIAGNOSIS — M0579 Rheumatoid arthritis with rheumatoid factor of multiple sites without organ or systems involvement: Secondary | ICD-10-CM

## 2024-10-07 ENCOUNTER — Ambulatory Visit: Payer: Self-pay | Admitting: Rheumatology

## 2024-10-07 ENCOUNTER — Telehealth: Payer: Self-pay

## 2024-10-07 ENCOUNTER — Ambulatory Visit
Admission: RE | Admit: 2024-10-07 | Discharge: 2024-10-07 | Disposition: A | Discharge status: Home / Self Care | Source: Ambulatory Visit | Attending: Family Medicine | Admitting: Family Medicine

## 2024-10-07 DIAGNOSIS — Z1231 Encounter for screening mammogram for malignant neoplasm of breast: Secondary | ICD-10-CM | POA: Diagnosis present

## 2024-10-07 LAB — CMP14+EGFR
ALT: 12 IU/L (ref 0–32)
AST: 14 IU/L (ref 0–40)
Albumin: 4.3 g/dL (ref 3.9–4.9)
Alkaline Phosphatase: 68 IU/L (ref 49–135)
BUN/Creatinine Ratio: 21 (ref 12–28)
BUN: 19 mg/dL (ref 8–27)
Bilirubin Total: 0.4 mg/dL (ref 0.0–1.2)
CO2: 22 mmol/L (ref 20–29)
Calcium: 9.7 mg/dL (ref 8.7–10.3)
Chloride: 104 mmol/L (ref 96–106)
Creatinine, Ser: 0.89 mg/dL (ref 0.57–1.00)
Globulin, Total: 2 g/dL (ref 1.5–4.5)
Glucose: 104 mg/dL — ABNORMAL HIGH (ref 70–99)
Potassium: 5 mmol/L (ref 3.5–5.2)
Sodium: 140 mmol/L (ref 134–144)
Total Protein: 6.3 g/dL (ref 6.0–8.5)
eGFR: 72 mL/min/1.73

## 2024-10-07 LAB — CBC WITH DIFFERENTIAL/PLATELET
Basophils Absolute: 0 x10E3/uL (ref 0.0–0.2)
Basos: 1 %
EOS (ABSOLUTE): 0.2 x10E3/uL (ref 0.0–0.4)
Eos: 3 %
Hematocrit: 40 % (ref 34.0–46.6)
Hemoglobin: 12.9 g/dL (ref 11.1–15.9)
Immature Grans (Abs): 0 x10E3/uL (ref 0.0–0.1)
Immature Granulocytes: 0 %
Lymphocytes Absolute: 2.7 x10E3/uL (ref 0.7–3.1)
Lymphs: 41 %
MCH: 30.7 pg (ref 26.6–33.0)
MCHC: 32.3 g/dL (ref 31.5–35.7)
MCV: 95 fL (ref 79–97)
Monocytes Absolute: 0.5 x10E3/uL (ref 0.1–0.9)
Monocytes: 7 %
Neutrophils Absolute: 3.2 x10E3/uL (ref 1.4–7.0)
Neutrophils: 48 %
Platelets: 237 x10E3/uL (ref 150–450)
RBC: 4.2 x10E6/uL (ref 3.77–5.28)
RDW: 13 % (ref 11.7–15.4)
WBC: 6.6 x10E3/uL (ref 3.4–10.8)

## 2024-10-07 LAB — LIPID PANEL
Chol/HDL Ratio: 4.7 ratio — ABNORMAL HIGH (ref 0.0–4.4)
Cholesterol, Total: 223 mg/dL — ABNORMAL HIGH (ref 100–199)
HDL: 47 mg/dL
LDL Chol Calc (NIH): 140 mg/dL — ABNORMAL HIGH (ref 0–99)
Triglycerides: 202 mg/dL — ABNORMAL HIGH (ref 0–149)
VLDL Cholesterol Cal: 36 mg/dL (ref 5–40)

## 2024-10-07 NOTE — Telephone Encounter (Signed)
 See my chart message

## 2024-10-07 NOTE — Progress Notes (Signed)
 Triglycerides and LDL are elevated.  Please forward the results to her PCP.

## 2024-10-07 NOTE — Telephone Encounter (Signed)
 Received notification from [facility] that pt's grant had expired and her copay is now $2,088.40. Contacted pt and discussed options, at this time she is going to look into getting herself on waitlists for grants to open up. I provided her with my contact info and urged her to reach back out to me if she changes her mind and would like to proceed a different way.

## 2024-10-08 ENCOUNTER — Other Ambulatory Visit: Payer: Self-pay

## 2024-10-09 ENCOUNTER — Ambulatory Visit: Payer: Self-pay | Admitting: Family Medicine

## 2024-10-09 ENCOUNTER — Encounter: Admitting: Family Medicine

## 2024-10-30 ENCOUNTER — Encounter: Admitting: Family Medicine

## 2024-11-24 ENCOUNTER — Ambulatory Visit

## 2025-02-11 ENCOUNTER — Ambulatory Visit: Admitting: Physician Assistant
